# Patient Record
Sex: Male | Born: 1955
Health system: Southern US, Community
[De-identification: ages and names within clinical notes are randomized; demographics above are authoritative.]

## PROBLEM LIST (undated history)

## (undated) DIAGNOSIS — I7121 Aneurysm of the ascending aorta, without rupture: Secondary | ICD-10-CM

## (undated) DIAGNOSIS — C61 Malignant neoplasm of prostate: Secondary | ICD-10-CM

## (undated) DIAGNOSIS — R739 Hyperglycemia, unspecified: Secondary | ICD-10-CM

## (undated) DIAGNOSIS — T7840XA Allergy, unspecified, initial encounter: Secondary | ICD-10-CM

## (undated) DIAGNOSIS — R200 Anesthesia of skin: Secondary | ICD-10-CM

## (undated) DIAGNOSIS — M4802 Spinal stenosis, cervical region: Secondary | ICD-10-CM

## (undated) DIAGNOSIS — M5090 Cervical disc disorder, unspecified, unspecified cervical region: Secondary | ICD-10-CM

## (undated) DIAGNOSIS — G992 Myelopathy in diseases classified elsewhere: Secondary | ICD-10-CM

## (undated) DIAGNOSIS — L570 Actinic keratosis: Secondary | ICD-10-CM

## (undated) DIAGNOSIS — R972 Elevated prostate specific antigen [PSA]: Secondary | ICD-10-CM

## (undated) DIAGNOSIS — G4730 Sleep apnea, unspecified: Secondary | ICD-10-CM

## (undated) DIAGNOSIS — R0789 Other chest pain: Secondary | ICD-10-CM

## (undated) DIAGNOSIS — G4733 Obstructive sleep apnea (adult) (pediatric): Secondary | ICD-10-CM

## (undated) DIAGNOSIS — M1990 Unspecified osteoarthritis, unspecified site: Secondary | ICD-10-CM

## (undated) DIAGNOSIS — R7303 Prediabetes: Secondary | ICD-10-CM

## (undated) DIAGNOSIS — G709 Myoneural disorder, unspecified: Secondary | ICD-10-CM

## (undated) DIAGNOSIS — M519 Unspecified thoracic, thoracolumbar and lumbosacral intervertebral disc disorder: Secondary | ICD-10-CM

## (undated) DIAGNOSIS — I700 Atherosclerosis of aorta: Secondary | ICD-10-CM

## (undated) DIAGNOSIS — K219 Gastro-esophageal reflux disease without esophagitis: Secondary | ICD-10-CM

## (undated) DIAGNOSIS — I10 Essential (primary) hypertension: Secondary | ICD-10-CM

## (undated) DIAGNOSIS — M109 Gout, unspecified: Secondary | ICD-10-CM

## (undated) DIAGNOSIS — M461 Sacroiliitis, not elsewhere classified: Secondary | ICD-10-CM

## (undated) DIAGNOSIS — M1611 Unilateral primary osteoarthritis, right hip: Secondary | ICD-10-CM

## (undated) DIAGNOSIS — E119 Type 2 diabetes mellitus without complications: Secondary | ICD-10-CM

## (undated) DIAGNOSIS — I712 Thoracic aortic aneurysm, without rupture, unspecified: Secondary | ICD-10-CM

## (undated) DIAGNOSIS — E785 Hyperlipidemia, unspecified: Secondary | ICD-10-CM

## (undated) HISTORY — DX: Actinic keratosis: L57.0

## (undated) HISTORY — PX: INGUINAL HERNIA REPAIR: SHX194

## (undated) HISTORY — DX: Myoneural disorder, unspecified: G70.9

## (undated) HISTORY — PX: COLONOSCOPY: SHX174

## (undated) HISTORY — PX: APPENDECTOMY: SHX54

## (undated) HISTORY — PX: ROTATOR CUFF REPAIR: SHX139

## (undated) HISTORY — PX: TONSILLECTOMY: SUR1361

## (undated) HISTORY — DX: Allergy, unspecified, initial encounter: T78.40XA

## (undated) HISTORY — PX: HERNIA REPAIR: SHX51

## (undated) HISTORY — PX: CATARACT EXTRACTION: SUR2

---

## 2004-06-26 ENCOUNTER — Ambulatory Visit: Payer: Self-pay | Admitting: Orthopaedic Surgery

## 2004-12-19 ENCOUNTER — Ambulatory Visit: Payer: Self-pay | Admitting: Anesthesiology

## 2004-12-31 ENCOUNTER — Ambulatory Visit: Payer: Self-pay | Admitting: Anesthesiology

## 2005-01-07 ENCOUNTER — Ambulatory Visit: Payer: Self-pay | Admitting: Anesthesiology

## 2005-01-30 ENCOUNTER — Ambulatory Visit: Payer: Self-pay | Admitting: Anesthesiology

## 2005-08-19 ENCOUNTER — Emergency Department: Payer: Self-pay | Admitting: Emergency Medicine

## 2009-10-15 ENCOUNTER — Ambulatory Visit: Payer: Self-pay | Admitting: Unknown Physician Specialty

## 2012-10-28 ENCOUNTER — Ambulatory Visit: Payer: Self-pay | Admitting: Internal Medicine

## 2013-11-05 ENCOUNTER — Emergency Department: Payer: Self-pay | Admitting: Emergency Medicine

## 2013-11-05 LAB — URINALYSIS, COMPLETE
Bacteria: NONE SEEN
Bilirubin,UR: NEGATIVE
Blood: NEGATIVE
Glucose,UR: NEGATIVE mg/dL (ref 0–75)
Ketone: NEGATIVE
Leukocyte Esterase: NEGATIVE
NITRITE: NEGATIVE
PH: 7 (ref 4.5–8.0)
Protein: NEGATIVE
RBC,UR: 3 /HPF (ref 0–5)
SPECIFIC GRAVITY: 1.008 (ref 1.003–1.030)
Squamous Epithelial: NONE SEEN
WBC UR: NONE SEEN /HPF (ref 0–5)

## 2013-11-05 LAB — CBC
HCT: 47.3 % (ref 40.0–52.0)
HGB: 16.1 g/dL (ref 13.0–18.0)
MCH: 29.1 pg (ref 26.0–34.0)
MCHC: 34 g/dL (ref 32.0–36.0)
MCV: 86 fL (ref 80–100)
Platelet: 290 10*3/uL (ref 150–440)
RBC: 5.51 10*6/uL (ref 4.40–5.90)
RDW: 13.7 % (ref 11.5–14.5)
WBC: 10.6 10*3/uL (ref 3.8–10.6)

## 2013-11-05 LAB — COMPREHENSIVE METABOLIC PANEL
ALT: 40 U/L
Albumin: 3.7 g/dL (ref 3.4–5.0)
Alkaline Phosphatase: 62 U/L
Anion Gap: 8 (ref 7–16)
BUN: 11 mg/dL (ref 7–18)
Bilirubin,Total: 0.4 mg/dL (ref 0.2–1.0)
CALCIUM: 10.4 mg/dL — AB (ref 8.5–10.1)
Chloride: 106 mmol/L (ref 98–107)
Co2: 26 mmol/L (ref 21–32)
Creatinine: 1.13 mg/dL (ref 0.60–1.30)
Glucose: 150 mg/dL — ABNORMAL HIGH (ref 65–99)
OSMOLALITY: 282 (ref 275–301)
POTASSIUM: 3.8 mmol/L (ref 3.5–5.1)
SGOT(AST): 30 U/L (ref 15–37)
Sodium: 140 mmol/L (ref 136–145)
Total Protein: 7.9 g/dL (ref 6.4–8.2)

## 2013-11-05 LAB — TROPONIN I

## 2013-11-05 LAB — LIPASE, BLOOD: LIPASE: 228 U/L (ref 73–393)

## 2014-03-07 ENCOUNTER — Emergency Department: Payer: Self-pay | Admitting: Emergency Medicine

## 2015-05-11 ENCOUNTER — Encounter: Payer: Self-pay | Admitting: *Deleted

## 2015-05-14 ENCOUNTER — Ambulatory Visit
Admission: RE | Admit: 2015-05-14 | Discharge: 2015-05-14 | Disposition: A | Discharge status: Home / Self Care | Payer: BLUE CROSS/BLUE SHIELD | Source: Ambulatory Visit | Attending: Unknown Physician Specialty | Admitting: Unknown Physician Specialty

## 2015-05-14 ENCOUNTER — Ambulatory Visit: Payer: BLUE CROSS/BLUE SHIELD | Admitting: Anesthesiology

## 2015-05-14 ENCOUNTER — Encounter: Payer: Self-pay | Admitting: Anesthesiology

## 2015-05-14 ENCOUNTER — Encounter
Admission: RE | Disposition: A | Discharge status: Home / Self Care | Payer: Self-pay | Source: Ambulatory Visit | Attending: Unknown Physician Specialty

## 2015-05-14 DIAGNOSIS — Z86010 Personal history of colonic polyps: Secondary | ICD-10-CM | POA: Diagnosis not present

## 2015-05-14 DIAGNOSIS — R0789 Other chest pain: Secondary | ICD-10-CM | POA: Insufficient documentation

## 2015-05-14 DIAGNOSIS — R739 Hyperglycemia, unspecified: Secondary | ICD-10-CM | POA: Insufficient documentation

## 2015-05-14 DIAGNOSIS — I10 Essential (primary) hypertension: Secondary | ICD-10-CM | POA: Insufficient documentation

## 2015-05-14 DIAGNOSIS — M109 Gout, unspecified: Secondary | ICD-10-CM | POA: Diagnosis not present

## 2015-05-14 DIAGNOSIS — R200 Anesthesia of skin: Secondary | ICD-10-CM | POA: Insufficient documentation

## 2015-05-14 DIAGNOSIS — E785 Hyperlipidemia, unspecified: Secondary | ICD-10-CM | POA: Diagnosis not present

## 2015-05-14 DIAGNOSIS — K219 Gastro-esophageal reflux disease without esophagitis: Secondary | ICD-10-CM | POA: Diagnosis not present

## 2015-05-14 DIAGNOSIS — Z79899 Other long term (current) drug therapy: Secondary | ICD-10-CM | POA: Insufficient documentation

## 2015-05-14 DIAGNOSIS — K640 First degree hemorrhoids: Secondary | ICD-10-CM | POA: Diagnosis not present

## 2015-05-14 DIAGNOSIS — Z1211 Encounter for screening for malignant neoplasm of colon: Secondary | ICD-10-CM | POA: Insufficient documentation

## 2015-05-14 DIAGNOSIS — M1990 Unspecified osteoarthritis, unspecified site: Secondary | ICD-10-CM | POA: Diagnosis not present

## 2015-05-14 DIAGNOSIS — K5730 Diverticulosis of large intestine without perforation or abscess without bleeding: Secondary | ICD-10-CM | POA: Diagnosis not present

## 2015-05-14 DIAGNOSIS — G4730 Sleep apnea, unspecified: Secondary | ICD-10-CM | POA: Insufficient documentation

## 2015-05-14 HISTORY — DX: Allergy, unspecified, initial encounter: T78.40XA

## 2015-05-14 HISTORY — PX: COLONOSCOPY: SHX5424

## 2015-05-14 HISTORY — DX: Unspecified osteoarthritis, unspecified site: M19.90

## 2015-05-14 HISTORY — DX: Gout, unspecified: M10.9

## 2015-05-14 HISTORY — DX: Hyperlipidemia, unspecified: E78.5

## 2015-05-14 HISTORY — DX: Sleep apnea, unspecified: G47.30

## 2015-05-14 HISTORY — DX: Other chest pain: R07.89

## 2015-05-14 HISTORY — DX: Hyperglycemia, unspecified: R73.9

## 2015-05-14 HISTORY — DX: Anesthesia of skin: R20.0

## 2015-05-14 HISTORY — DX: Gastro-esophageal reflux disease without esophagitis: K21.9

## 2015-05-14 HISTORY — DX: Essential (primary) hypertension: I10

## 2015-05-14 SURGERY — COLONOSCOPY
Anesthesia: General

## 2015-05-14 MED ORDER — MIDAZOLAM HCL 5 MG/5ML IJ SOLN
INTRAMUSCULAR | Status: DC | PRN
  Administered 2015-05-14: 1 mg via INTRAVENOUS

## 2015-05-14 MED ORDER — PROPOFOL 10 MG/ML IV BOLUS
INTRAVENOUS | Status: DC | PRN
  Administered 2015-05-14: 50 mg via INTRAVENOUS

## 2015-05-14 MED ORDER — SODIUM CHLORIDE 0.9 % IV SOLN
INTRAVENOUS | Status: DC
  Administered 2015-05-14: 1000 mL via INTRAVENOUS

## 2015-05-14 MED ORDER — FENTANYL CITRATE (PF) 100 MCG/2ML IJ SOLN
INTRAMUSCULAR | Status: DC | PRN
  Administered 2015-05-14: 50 ug via INTRAVENOUS

## 2015-05-14 MED ORDER — SODIUM CHLORIDE 0.9 % IV SOLN: INTRAVENOUS | Status: DC

## 2015-05-14 MED ORDER — PROPOFOL 500 MG/50ML IV EMUL
INTRAVENOUS | Status: DC | PRN
  Administered 2015-05-14: 100 ug/kg/min via INTRAVENOUS

## 2015-05-14 MED ORDER — LIDOCAINE HCL (PF) 2 % IJ SOLN
INTRAMUSCULAR | Status: DC | PRN
  Administered 2015-05-14: 60 mg

## 2015-05-14 NOTE — Transfer of Care (Signed)
Immediate Anesthesia Transfer of Care Note  Patient: Rodney Clark  Procedure(s) Performed: Procedure(s): COLONOSCOPY (N/A)  Patient Location: PACU  Anesthesia Type:General  Level of Consciousness: sedated  Airway & Oxygen Therapy: Patient Spontanous Breathing and Patient connected to nasal cannula oxygen  Post-op Assessment: Report given to RN and Post -op Vital signs reviewed and stable  Post vital signs: Reviewed and stable  Last Vitals:  Filed Vitals:   05/14/15 1147  BP: 135/75  Pulse: 53  Temp: 37.3 C  Resp: 16    Complications: No apparent anesthesia complications

## 2015-05-14 NOTE — H&P (Signed)
   Primary Care Physician:  Gabriel Earing, MD Primary Gastroenterologist:  Dr. Vira Agar  Pre-Procedure History & Physical: HPI:  Rodney Clark is a 60 y.o. male is here for an colonoscopy.   Past Medical History  Diagnosis Date  . Allergic state   . Arthritis   . Atypical chest pain   . GERD (gastroesophageal reflux disease)   . Gout   . Hyperglycemia   . Hyperlipidemia   . Hypertension   . Sleep apnea   . Numbness of toes     Past Surgical History  Procedure Laterality Date  . Tonsillectomy    . Appendectomy    . Hernia repair    . Rotator cuff repair Right     Prior to Admission medications   Medication Sig Start Date End Date Taking? Authorizing Provider  azelastine (ASTELIN) 0.1 % nasal spray Place 1 spray into both nostrils 2 (two) times daily. Use in each nostril as directed   Yes Historical Provider, MD  baclofen (LIORESAL) 10 MG tablet Take 10 mg by mouth 3 (three) times daily.   Yes Historical Provider, MD  famotidine (PEPCID) 20 MG tablet Take 20 mg by mouth 2 (two) times daily.   Yes Historical Provider, MD  fenofibrate 160 MG tablet Take 160 mg by mouth daily.   Yes Historical Provider, MD  gabapentin (NEURONTIN) 600 MG tablet Take 600 mg by mouth 3 (three) times daily.   Yes Historical Provider, MD  hydrocortisone (ANUSOL-HC) 2.5 % rectal cream Place 1 application rectally 2 (two) times daily.   Yes Historical Provider, MD  losartan-hydrochlorothiazide (HYZAAR) 50-12.5 MG tablet Take 1 tablet by mouth daily.   Yes Historical Provider, MD    Allergies as of 03/22/2015  . (Not on File)    History reviewed. No pertinent family history.  Social History   Social History  . Marital Status: Married    Spouse Name: N/A  . Number of Children: N/A  . Years of Education: N/A   Occupational History  . Not on file.   Social History Main Topics  . Smoking status: Not on file  . Smokeless tobacco: Not on file  . Alcohol Use: Not on file  . Drug Use:  Not on file  . Sexual Activity: Not on file   Other Topics Concern  . Not on file   Social History Narrative    Review of Systems: See HPI, otherwise negative ROS  Physical Exam: BP 135/75 mmHg  Pulse 53  Temp(Src) 99.2 F (37.3 C) (Tympanic)  Resp 16  Ht 5\' 10"  (1.778 m)  Wt 109.317 kg (241 lb)  BMI 34.58 kg/m2  SpO2 99% General:   Alert,  pleasant and cooperative in NAD Head:  Normocephalic and atraumatic. Neck:  Supple; no masses or thyromegaly. Lungs:  Clear throughout to auscultation.    Heart:  Regular rate and rhythm. Abdomen:  Soft, nontender and nondistended. Normal bowel sounds, without guarding, and without rebound.   Neurologic:  Alert and  oriented x4;  grossly normal neurologically.  Impression/Plan: Rodney Clark is here for an colonoscopy to be performed for [facility] colon polyps  Risks, benefits, limitations, and alternatives regarding  colonoscopy have been reviewed with the patient.  Questions have been answered.  All parties agreeable.   Gaylyn Cheers, MD  05/14/2015, 1:19 PM

## 2015-05-14 NOTE — Anesthesia Preprocedure Evaluation (Addendum)
Anesthesia Evaluation  Patient identified by MRN, date of birth, ID band Patient awake    Reviewed: Allergy & Precautions, NPO status , Patient's Chart, lab work & pertinent test results  Airway Mallampati: II  TM Distance: >3 FB Neck ROM: Full    Dental  (+) Chipped   Pulmonary sleep apnea ,  Sinus allergies   Pulmonary exam normal breath sounds clear to auscultation       Cardiovascular hypertension, Pt. on medications Normal cardiovascular exam     Neuro/Psych Numbness of toes negative psych ROS   GI/Hepatic Neg liver ROS, GERD  Medicated and Controlled,  Endo/Other  negative endocrine ROS  Renal/GU negative Renal ROS     Musculoskeletal  (+) Arthritis , Osteoarthritis,    Abdominal Normal abdominal exam  (+)   Peds negative pediatric ROS (+)  Hematology negative hematology ROS (+)   Anesthesia Other Findings   Reproductive/Obstetrics negative OB ROS                            Anesthesia Physical Anesthesia Plan  ASA: II  Anesthesia Plan: General   Post-op Pain Management:    Induction: Intravenous  Airway Management Planned: Nasal Cannula  Additional Equipment:   Intra-op Plan:   Post-operative Plan:   Informed Consent: I have reviewed the patients History and Physical, chart, labs and discussed the procedure including the risks, benefits and alternatives for the proposed anesthesia with the patient or authorized representative who has indicated his/her understanding and acceptance.   Dental advisory given  Plan Discussed with: CRNA and Surgeon  Anesthesia Plan Comments:         Anesthesia Quick Evaluation

## 2015-05-14 NOTE — Op Note (Signed)
[facility] Gastroenterology Patient Name: Rodney Clark Procedure Date: 05/14/2015 1:23 PM MRN: [id number] Account #: [id number] Date of Birth: Mar 29, 1956 Admit Type: Outpatient Age: 60 Room: [facility] ENDO ROOM 1 Gender: Male Note Status: Finalized Procedure:         Colonoscopy Indications:       High risk colon cancer surveillance: Personal history of                     colonic polyps Providers:         Manya Silvas, MD Referring MD:      Ramonita Lab, MD (Referring MD) Medicines:         Propofol per Anesthesia Complications:     No immediate complications. Procedure:         Pre-Anesthesia Assessment:                    - After reviewing the risks and benefits, the patient was                     deemed in satisfactory condition to undergo the procedure.                    After obtaining informed consent, the colonoscope was                     passed under direct vision. Throughout the procedure, the                     patient's blood pressure, pulse, and oxygen saturations                     were monitored continuously. The Colonoscope was                     introduced through the anus and advanced to the the cecum,                     identified by appendiceal orifice and ileocecal valve. The                     colonoscopy was performed without difficulty. The patient                     tolerated the procedure well. The quality of the bowel                     preparation was good. Findings:      A few small-mouthed diverticula were found in the sigmoid colon.      Internal hemorrhoids were found during endoscopy. The hemorrhoids were       small and Grade I (internal hemorrhoids that do not prolapse).      The exam was otherwise without abnormality. Impression:        - Diverticulosis in the sigmoid colon.                    - Internal hemorrhoids.                    - The examination was otherwise normal.                    - No specimens  collected. Recommendation:    - Repeat colonoscopy in 5 years for surveillance. Manya Silvas, MD  05/14/2015 1:47:54 PM This report has been signed electronically. Number of Addenda: 0 Note Initiated On: 05/14/2015 1:23 PM Scope Withdrawal Time: 0 hours 11 minutes 18 seconds  Total Procedure Duration: 0 hours 17 minutes 6 seconds       [facility]

## 2015-05-16 ENCOUNTER — Encounter: Payer: Self-pay | Admitting: Unknown Physician Specialty

## 2015-05-16 NOTE — Anesthesia Postprocedure Evaluation (Signed)
Anesthesia Post Note  Patient: Rodney Clark  Procedure(s) Performed: Procedure(s) (LRB): COLONOSCOPY (N/A)  Patient location during evaluation: Endoscopy Anesthesia Type: General Level of consciousness: awake and alert and oriented Pain management: pain level controlled Vital Signs Assessment: post-procedure vital signs reviewed and stable Respiratory status: spontaneous breathing Cardiovascular status: blood pressure returned to baseline Anesthetic complications: no    Last Vitals:  Filed Vitals:   05/14/15 1410 05/14/15 1420  BP: 104/76 113/75  Pulse: 61 64  Temp:    Resp: 18 16    Last Pain: There were no vitals filed for this visit.               Blakelynn Scheeler

## 2015-08-28 DIAGNOSIS — I10 Essential (primary) hypertension: Secondary | ICD-10-CM | POA: Diagnosis present

## 2017-05-05 DIAGNOSIS — M5416 Radiculopathy, lumbar region: Secondary | ICD-10-CM | POA: Diagnosis not present

## 2017-05-05 DIAGNOSIS — M5126 Other intervertebral disc displacement, lumbar region: Secondary | ICD-10-CM | POA: Diagnosis not present

## 2017-05-05 DIAGNOSIS — Z6837 Body mass index (BMI) 37.0-37.9, adult: Secondary | ICD-10-CM | POA: Diagnosis not present

## 2017-05-08 DIAGNOSIS — Z23 Encounter for immunization: Secondary | ICD-10-CM | POA: Diagnosis not present

## 2017-05-19 DIAGNOSIS — M5126 Other intervertebral disc displacement, lumbar region: Secondary | ICD-10-CM | POA: Diagnosis not present

## 2017-06-23 DIAGNOSIS — M48062 Spinal stenosis, lumbar region with neurogenic claudication: Secondary | ICD-10-CM | POA: Diagnosis not present

## 2017-06-23 DIAGNOSIS — M5416 Radiculopathy, lumbar region: Secondary | ICD-10-CM | POA: Diagnosis not present

## 2017-06-23 DIAGNOSIS — M5126 Other intervertebral disc displacement, lumbar region: Secondary | ICD-10-CM | POA: Diagnosis not present

## 2017-06-30 DIAGNOSIS — Z6836 Body mass index (BMI) 36.0-36.9, adult: Secondary | ICD-10-CM | POA: Diagnosis not present

## 2017-06-30 DIAGNOSIS — M5126 Other intervertebral disc displacement, lumbar region: Secondary | ICD-10-CM | POA: Diagnosis not present

## 2017-06-30 DIAGNOSIS — M48062 Spinal stenosis, lumbar region with neurogenic claudication: Secondary | ICD-10-CM | POA: Diagnosis not present

## 2017-07-08 DIAGNOSIS — M48062 Spinal stenosis, lumbar region with neurogenic claudication: Secondary | ICD-10-CM | POA: Diagnosis not present

## 2017-07-08 DIAGNOSIS — M5126 Other intervertebral disc displacement, lumbar region: Secondary | ICD-10-CM | POA: Diagnosis not present

## 2017-07-08 DIAGNOSIS — M5416 Radiculopathy, lumbar region: Secondary | ICD-10-CM | POA: Diagnosis not present

## 2017-07-28 DIAGNOSIS — D2261 Melanocytic nevi of right upper limb, including shoulder: Secondary | ICD-10-CM | POA: Diagnosis not present

## 2017-07-28 DIAGNOSIS — D225 Melanocytic nevi of trunk: Secondary | ICD-10-CM | POA: Diagnosis not present

## 2017-07-28 DIAGNOSIS — Z85828 Personal history of other malignant neoplasm of skin: Secondary | ICD-10-CM | POA: Diagnosis not present

## 2017-07-28 DIAGNOSIS — L570 Actinic keratosis: Secondary | ICD-10-CM | POA: Diagnosis not present

## 2017-07-28 DIAGNOSIS — L820 Inflamed seborrheic keratosis: Secondary | ICD-10-CM | POA: Diagnosis not present

## 2017-07-28 DIAGNOSIS — X32XXXA Exposure to sunlight, initial encounter: Secondary | ICD-10-CM | POA: Diagnosis not present

## 2017-07-28 DIAGNOSIS — L538 Other specified erythematous conditions: Secondary | ICD-10-CM | POA: Diagnosis not present

## 2017-08-17 DIAGNOSIS — M5126 Other intervertebral disc displacement, lumbar region: Secondary | ICD-10-CM | POA: Diagnosis not present

## 2017-08-17 DIAGNOSIS — M48062 Spinal stenosis, lumbar region with neurogenic claudication: Secondary | ICD-10-CM | POA: Diagnosis not present

## 2017-08-17 DIAGNOSIS — Z6836 Body mass index (BMI) 36.0-36.9, adult: Secondary | ICD-10-CM | POA: Diagnosis not present

## 2017-08-20 ENCOUNTER — Other Ambulatory Visit: Payer: Self-pay | Admitting: Physical Medicine and Rehabilitation

## 2017-08-20 DIAGNOSIS — M48062 Spinal stenosis, lumbar region with neurogenic claudication: Secondary | ICD-10-CM

## 2017-08-31 ENCOUNTER — Other Ambulatory Visit: Payer: Self-pay | Admitting: Physical Medicine and Rehabilitation

## 2017-08-31 ENCOUNTER — Ambulatory Visit
Admission: RE | Admit: 2017-08-31 | Discharge: 2017-08-31 | Disposition: A | Discharge status: Home / Self Care | Payer: 59 | Source: Ambulatory Visit | Attending: Physical Medicine and Rehabilitation | Admitting: Physical Medicine and Rehabilitation

## 2017-08-31 ENCOUNTER — Ambulatory Visit
Admission: RE | Admit: 2017-08-31 | Discharge: 2017-08-31 | Disposition: A | Discharge status: Home / Self Care | Payer: Self-pay | Source: Ambulatory Visit | Attending: Physical Medicine and Rehabilitation | Admitting: Physical Medicine and Rehabilitation

## 2017-08-31 DIAGNOSIS — R52 Pain, unspecified: Secondary | ICD-10-CM

## 2017-08-31 DIAGNOSIS — M48062 Spinal stenosis, lumbar region with neurogenic claudication: Secondary | ICD-10-CM

## 2017-08-31 DIAGNOSIS — M48061 Spinal stenosis, lumbar region without neurogenic claudication: Secondary | ICD-10-CM | POA: Diagnosis not present

## 2017-08-31 MED ORDER — IOPAMIDOL (ISOVUE-M 200) INJECTION 41%
15.0000 mL | Freq: Once | INTRAMUSCULAR | Status: AC
  Administered 2017-08-31: 15 mL via INTRATHECAL

## 2017-08-31 MED ORDER — ONDANSETRON HCL 4 MG/2ML IJ SOLN
4.0000 mg | Freq: Once | INTRAMUSCULAR | Status: AC
Start: 2017-08-31 — End: 2017-08-31
  Administered 2017-08-31: 4 mg via INTRAMUSCULAR

## 2017-08-31 MED ORDER — MEPERIDINE HCL 100 MG/ML IJ SOLN
75.0000 mg | Freq: Once | INTRAMUSCULAR | Status: AC
  Administered 2017-08-31: 75 mg via INTRAMUSCULAR

## 2017-08-31 NOTE — Discharge Instructions (Signed)

## 2017-08-31 NOTE — Progress Notes (Signed)
Patient states he took two Valium 5mg  tablets "about an hour ago" that Dr. Brien Few prescribed him.

## 2017-09-21 DIAGNOSIS — M5126 Other intervertebral disc displacement, lumbar region: Secondary | ICD-10-CM | POA: Diagnosis not present

## 2017-09-21 DIAGNOSIS — M5416 Radiculopathy, lumbar region: Secondary | ICD-10-CM | POA: Diagnosis not present

## 2017-09-21 DIAGNOSIS — M48062 Spinal stenosis, lumbar region with neurogenic claudication: Secondary | ICD-10-CM | POA: Diagnosis not present

## 2017-09-23 DIAGNOSIS — M5416 Radiculopathy, lumbar region: Secondary | ICD-10-CM | POA: Diagnosis not present

## 2017-09-23 DIAGNOSIS — M5126 Other intervertebral disc displacement, lumbar region: Secondary | ICD-10-CM | POA: Diagnosis not present

## 2017-09-23 DIAGNOSIS — M48062 Spinal stenosis, lumbar region with neurogenic claudication: Secondary | ICD-10-CM | POA: Diagnosis not present

## 2017-09-30 DIAGNOSIS — G4733 Obstructive sleep apnea (adult) (pediatric): Secondary | ICD-10-CM | POA: Diagnosis not present

## 2017-09-30 DIAGNOSIS — E7849 Other hyperlipidemia: Secondary | ICD-10-CM | POA: Diagnosis not present

## 2017-09-30 DIAGNOSIS — M1990 Unspecified osteoarthritis, unspecified site: Secondary | ICD-10-CM | POA: Diagnosis not present

## 2017-09-30 DIAGNOSIS — R739 Hyperglycemia, unspecified: Secondary | ICD-10-CM | POA: Diagnosis not present

## 2017-09-30 DIAGNOSIS — I10 Essential (primary) hypertension: Secondary | ICD-10-CM | POA: Diagnosis not present

## 2017-09-30 DIAGNOSIS — K219 Gastro-esophageal reflux disease without esophagitis: Secondary | ICD-10-CM | POA: Diagnosis not present

## 2017-10-08 DIAGNOSIS — M4317 Spondylolisthesis, lumbosacral region: Secondary | ICD-10-CM | POA: Diagnosis not present

## 2017-10-08 DIAGNOSIS — Z6837 Body mass index (BMI) 37.0-37.9, adult: Secondary | ICD-10-CM | POA: Diagnosis not present

## 2017-10-20 DIAGNOSIS — M545 Low back pain: Secondary | ICD-10-CM | POA: Diagnosis not present

## 2017-10-28 DIAGNOSIS — M545 Low back pain: Secondary | ICD-10-CM | POA: Diagnosis not present

## 2017-11-05 ENCOUNTER — Other Ambulatory Visit: Payer: Self-pay | Admitting: Neurosurgery

## 2017-11-05 DIAGNOSIS — Z6836 Body mass index (BMI) 36.0-36.9, adult: Secondary | ICD-10-CM | POA: Diagnosis not present

## 2017-11-05 DIAGNOSIS — M4317 Spondylolisthesis, lumbosacral region: Secondary | ICD-10-CM | POA: Diagnosis not present

## 2017-11-05 DIAGNOSIS — M7542 Impingement syndrome of left shoulder: Secondary | ICD-10-CM | POA: Diagnosis not present

## 2017-12-16 DIAGNOSIS — M25512 Pain in left shoulder: Secondary | ICD-10-CM | POA: Diagnosis not present

## 2017-12-19 DIAGNOSIS — Z23 Encounter for immunization: Secondary | ICD-10-CM | POA: Diagnosis not present

## 2017-12-22 NOTE — Pre-Procedure Instructions (Signed)
Rodney Clark  12/22/2017      [id number] [address], [place] [address] Phone: [phone] Fax: [phone]    Your procedure is scheduled on Oct. 8  Report to [facility] Admitting at 6:00 A.M.  Call this number if you have problems the morning of surgery:  [phone]   Remember:  Do not eat or drink after midnight.      Take these medicines the morning of surgery with A SIP OF WATER :              Tylenol if needed             Famotidine (pepcid)             fexofenadine (allegra)             Gabapentin (neurontin)             Tizanidine (zanaflex)             Medrol (methylprednsolone)                  7 days prior to surgery STOP taking any Aspirin(unless otherwise instructed by your surgeon), Aleve, Naproxen, Ibuprofen, Motrin, Advil, Goody's, BC's, all herbal medications, fish oil, and all vitamins                  Do not wear jewelry.  Do not wear lotions, powders, or perfumes, or deodorant.  Do not shave 48 hours prior to surgery.  Men may shave face and neck.  Do not bring valuables to the hospital.  [facility] is not responsible for any belongings or valuables.  Contacts, dentures or bridgework may not be worn into surgery.  Leave your suitcase in the car.  After surgery it may be brought to your room.  For patients admitted to the hospital, discharge time will be determined by your treatment team.  Patients discharged the day of surgery will not be allowed to drive home.    Special instructions:  [place]- Preparing For Surgery  Before surgery, you can play an important role. Because skin is not sterile, your skin needs to be as free of germs as possible. You can reduce the number of germs on your skin by washing with CHG (chlorahexidine gluconate) Soap before surgery.  CHG is an antiseptic cleaner which kills germs and bonds with the skin to continue killing germs even after  washing.    Oral Hygiene is also important to reduce your risk of infection.  Remember - BRUSH YOUR TEETH THE MORNING OF SURGERY WITH YOUR REGULAR TOOTHPASTE  Please do not use if you have an allergy to CHG or antibacterial soaps. If your skin becomes reddened/irritated stop using the CHG.  Do not shave (including legs and underarms) for at least 48 hours prior to first CHG shower. It is OK to shave your face.  Please follow these instructions carefully.   1. Shower the NIGHT BEFORE SURGERY and the MORNING OF SURGERY with CHG.   2. If you chose to wash your hair, wash your hair first as usual with your normal shampoo.  3. After you shampoo, rinse your hair and body thoroughly to remove the shampoo.  4. Use CHG as you would any other liquid soap. You can apply CHG directly to the skin and wash gently with a scrungie or a clean washcloth.   5. Apply the CHG Soap to  your body ONLY FROM THE NECK DOWN.  Do not use on open wounds or open sores. Avoid contact with your eyes, ears, mouth and genitals (private parts). Wash Face and genitals (private parts)  with your normal soap.  6. Wash thoroughly, paying special attention to the area where your surgery will be performed.  7. Thoroughly rinse your body with warm water from the neck down.  8. DO NOT shower/wash with your normal soap after using and rinsing off the CHG Soap.  9. Pat yourself dry with a CLEAN TOWEL.  10. Wear CLEAN PAJAMAS to bed the night before surgery, wear comfortable clothes the morning of surgery  11. Place CLEAN SHEETS on your bed the night of your first shower and DO NOT SLEEP WITH PETS.    Day of Surgery:  Do not apply any deodorants/lotions.  Please wear clean clothes to the hospital/surgery center.   Remember to brush your teeth WITH YOUR REGULAR TOOTHPASTE.    Please read over the following fact sheets that you were given. Coughing and Deep Breathing, MRSA Information and Surgical Site Infection  Prevention

## 2017-12-23 ENCOUNTER — Encounter (HOSPITAL_COMMUNITY)
Admission: RE | Admit: 2017-12-23 | Discharge: 2017-12-23 | Disposition: A | Discharge status: Home / Self Care | Payer: 59 | Source: Ambulatory Visit | Attending: Neurosurgery | Admitting: Neurosurgery

## 2017-12-23 ENCOUNTER — Other Ambulatory Visit: Payer: Self-pay

## 2017-12-23 ENCOUNTER — Encounter (HOSPITAL_COMMUNITY): Payer: Self-pay

## 2017-12-23 DIAGNOSIS — Z01818 Encounter for other preprocedural examination: Secondary | ICD-10-CM | POA: Insufficient documentation

## 2017-12-23 DIAGNOSIS — I447 Left bundle-branch block, unspecified: Secondary | ICD-10-CM | POA: Insufficient documentation

## 2017-12-23 DIAGNOSIS — R001 Bradycardia, unspecified: Secondary | ICD-10-CM | POA: Insufficient documentation

## 2017-12-23 DIAGNOSIS — I10 Essential (primary) hypertension: Secondary | ICD-10-CM | POA: Diagnosis not present

## 2017-12-23 DIAGNOSIS — M4317 Spondylolisthesis, lumbosacral region: Secondary | ICD-10-CM | POA: Diagnosis not present

## 2017-12-23 LAB — BASIC METABOLIC PANEL
Anion gap: 9 (ref 5–15)
BUN: 16 mg/dL (ref 8–23)
CALCIUM: 9.7 mg/dL (ref 8.9–10.3)
CO2: 26 mmol/L (ref 22–32)
CREATININE: 0.87 mg/dL (ref 0.61–1.24)
Chloride: 103 mmol/L (ref 98–111)
GFR calc Af Amer: >60 mL/min (ref >60)
Glucose, Bld: 103 mg/dL — ABNORMAL HIGH (ref 70–99)
Potassium: 3.3 mmol/L — ABNORMAL LOW (ref 3.5–5.1)
Sodium: 138 mmol/L (ref 135–145)

## 2017-12-23 LAB — TYPE AND SCREEN
ABO/RH(D): B POS
Antibody Screen: NEGATIVE

## 2017-12-23 LAB — CBC WITH DIFFERENTIAL/PLATELET
ABS IMMATURE GRANULOCYTES: 0.1 10*3/uL (ref 0.0–0.1)
Basophils Absolute: 0.1 10*3/uL (ref 0.0–0.1)
Basophils Relative: 1 %
EOS PCT: 3 %
Eosinophils Absolute: 0.3 10*3/uL (ref 0.0–0.7)
HEMATOCRIT: 46.1 % (ref 39.0–52.0)
HEMOGLOBIN: 15 g/dL (ref 13.0–17.0)
Immature Granulocytes: 1 %
LYMPHS ABS: 3.9 10*3/uL (ref 0.7–4.0)
LYMPHS PCT: 35 %
MCH: 29 pg (ref 26.0–34.0)
MCHC: 32.5 g/dL (ref 30.0–36.0)
MCV: 89 fL (ref 78.0–100.0)
MONO ABS: 1.5 10*3/uL — AB (ref 0.1–1.0)
MONOS PCT: 13 %
NEUTROS ABS: 5.2 10*3/uL (ref 1.7–7.7)
Neutrophils Relative %: 47 %
Platelets: 255 10*3/uL (ref 150–400)
RBC: 5.18 MIL/uL (ref 4.22–5.81)
RDW: 12.3 % (ref 11.5–15.5)
WBC: 11.1 10*3/uL — ABNORMAL HIGH (ref 4.0–10.5)

## 2017-12-23 LAB — SURGICAL PCR SCREEN
MRSA, PCR: NEGATIVE
STAPHYLOCOCCUS AUREUS: NEGATIVE

## 2017-12-23 LAB — ABO/RH: ABO/RH(D): B POS

## 2017-12-23 NOTE — Pre-Procedure Instructions (Signed)
AVANT PRINTY  12/23/2017      [id number] [address], [place] [address] Phone: [phone] Fax: [phone]    Your procedure is scheduled on Oct. 8  Report to [facility] Admitting at 6:00 A.M.  Call this number if you have problems the morning of surgery:  [phone]   Remember:  Do not eat or drink after midnight.      Take these medicines the morning of surgery with A SIP OF WATER :              Tylenol if needed             Famotidine (pepcid)             fexofenadine (allegra)             Gabapentin (neurontin)             Tizanidine (zanaflex)             Medrol (methylprednsolone)                  7 days prior to surgery STOP taking any Aspirin(unless otherwise instructed by your surgeon), Aleve, Naproxen, Ibuprofen, Motrin, Advil, Goody's, BC's, all herbal medications, fish oil, and all vitamins                  Do not wear jewelry.  Do not wear lotions, powders, or perfumes, or deodorant.  Do not shave 48 hours prior to surgery.  Men may shave face and neck.  Do not bring valuables to the hospital.  [facility] is not responsible for any belongings or valuables.  Contacts, dentures or bridgework may not be worn into surgery.  Leave your suitcase in the car.  After surgery it may be brought to your room.  For patients admitted to the hospital, discharge time will be determined by your treatment team.  Patients discharged the day of surgery will not be allowed to drive home.    Special instructions:  [place]- Preparing For Surgery  Before surgery, you can play an important role. Because skin is not sterile, your skin needs to be as free of germs as possible. You can reduce the number of germs on your skin by washing with CHG (chlorahexidine gluconate) Soap before surgery.  CHG is an antiseptic cleaner which kills germs and bonds with the skin to continue killing germs even after  washing.    Oral Hygiene is also important to reduce your risk of infection.  Remember - BRUSH YOUR TEETH THE MORNING OF SURGERY WITH YOUR REGULAR TOOTHPASTE  Please do not use if you have an allergy to CHG or antibacterial soaps. If your skin becomes reddened/irritated stop using the CHG.  Do not shave (including legs and underarms) for at least 48 hours prior to first CHG shower. It is OK to shave your face.  Please follow these instructions carefully.   1. Shower the NIGHT BEFORE SURGERY and the MORNING OF SURGERY with CHG.   2. If you chose to wash your hair, wash your hair first as usual with your normal shampoo.  3. After you shampoo, rinse your hair and body thoroughly to remove the shampoo.  4. Use CHG as you would any other liquid soap. You can apply CHG directly to the skin and wash gently with a scrungie or a clean washcloth.   5. Apply the CHG Soap to  your body ONLY FROM THE NECK DOWN.  Do not use on open wounds or open sores. Avoid contact with your eyes, ears, mouth and genitals (private parts). Wash Face and genitals (private parts)  with your normal soap.  6. Wash thoroughly, paying special attention to the area where your surgery will be performed.  7. Thoroughly rinse your body with warm water from the neck down.  8. DO NOT shower/wash with your normal soap after using and rinsing off the CHG Soap.  9. Pat yourself dry with a CLEAN TOWEL.  10. Wear CLEAN PAJAMAS to bed the night before surgery, wear comfortable clothes the morning of surgery  11. Place CLEAN SHEETS on your bed the night of your first shower and DO NOT SLEEP WITH PETS.    Day of Surgery:  Do not apply any deodorants/lotions.  Please wear clean clothes to the hospital/surgery center.   Remember to brush your teeth WITH YOUR REGULAR TOOTHPASTE.    Please read over the following fact sheets that you were given. Coughing and Deep Breathing, MRSA Information and Surgical Site Infection  Prevention

## 2017-12-23 NOTE — Progress Notes (Signed)
PCP: Ramonita Lab [facility]) Cardiologist: denies DM: denies Echo: yes - pt does not recall date Stress Test: >5 years Cath: denies  Pt on Prednisone dose pack for shoulder pain and inflammation but will be finished by Friday, September 27th.  Pt has currently stopped taking Meloxicam.  Denies SOB, cough, fever, chest pain.

## 2017-12-24 NOTE — Progress Notes (Signed)
Anesthesia Chart Review:  Case:  [id number] Date/Time:  01/05/18 0915   Procedure:  PLIF - L5-S1 (N/A Back)   Anesthesia type:  General   Pre-op diagnosis:  Spondylolisthesis   Location:  [facility] OR ROOM 84 / [place] OR   Surgeon:  Earnie Larsson, MD      DISCUSSION: 62 yo male never smoker. Pertinent hx includes Gout, OSA not on CPAP, GERD, HTN, Atypical chest pain.  EKG done at PAT shows incomplete LBBB. Tracing from 2015 also shows LBBB, no significant change.  Anticipate he can proceed as planned barring acute status change.  VS: BP 125/68   Pulse 63   Temp 36.6 C   Resp 20   Ht 5\' 10"  (1.778 m)   SpO2 95%   BMI 34.58 kg/m   PROVIDERS: Adin Hector, MD is PCP last seen 09/30/2017  LABS: Labs reviewed: Acceptable for surgery. (all labs ordered are listed, but only abnormal results are displayed)  Labs Reviewed  CBC WITH DIFFERENTIAL/PLATELET - Abnormal; Notable for the following components:      Result Value   WBC 11.1 (*)    Monocytes Absolute 1.5 (*)    All other components within normal limits  BASIC METABOLIC PANEL - Abnormal; Notable for the following components:   Potassium 3.3 (*)    Glucose, Bld 103 (*)    All other components within normal limits  SURGICAL PCR SCREEN  TYPE AND SCREEN  ABO/RH     IMAGES: N/A   EKG: 12/23/17: Sinus bradycardia 58bpm. Incomplete left bundle branch block  CV: N/A  Past Medical History:  Diagnosis Date  . Allergic state   . Arthritis   . Atypical chest pain   . GERD (gastroesophageal reflux disease)   . Gout   . Hyperglycemia   . Hyperlipidemia   . Hypertension   . Numbness of toes   . Sleep apnea     Past Surgical History:  Procedure Laterality Date  . APPENDECTOMY    . COLONOSCOPY N/A 05/14/2015   Procedure: COLONOSCOPY;  Surgeon: Manya Silvas, MD;  Location: [facility] ENDOSCOPY;  Service: Endoscopy;  Laterality: N/A;  . HERNIA REPAIR    . ROTATOR CUFF REPAIR Right   . TONSILLECTOMY      MEDICATIONS: .  acetaminophen (TYLENOL) 500 MG tablet  . famotidine (PEPCID) 20 MG tablet  . fenofibrate 160 MG tablet  . fexofenadine (ALLEGRA) 180 MG tablet  . gabapentin (NEURONTIN) 300 MG capsule  . gabapentin (NEURONTIN) 600 MG tablet  . losartan-hydrochlorothiazide (HYZAAR) 50-12.5 MG tablet  . meloxicam (MOBIC) 15 MG tablet  . methylPREDNISolone (MEDROL) 4 MG tablet  . tiZANidine (ZANAFLEX) 4 MG tablet   No current facility-administered medications for this encounter.     Wynonia Musty [facility] Short Stay Center/Anesthesiology Phone [phone] 12/24/2017 2:16 PM

## 2017-12-24 NOTE — Progress Notes (Signed)
Anesthesia follow-up: See note by Karoline Caldwell, PA-C. Additional records noted:   Patient with incomplete LBBB (documented as left BBB on 11/07/13 EKG, although QRS is 104 ms). His current EKG appears stable dating back to at least 06/11/01 [facility]) and has had cardiac testing since this finding noted on his EKG.  Nuclear stress test in 10/28/12.  - Summary   1. No significant wall motion abnormality noted.   2. Pharmacological myocardial perfusion study with no significant  ischemia.   3. The estimated ejection fraction is 63%.   4. The left ventricular global function was normal.   5. There are no EKG changes concerning for ischemia.   6. There is no artifact noted on this study.   Echo 10/28/12: - Summary:   1. Left ventricular ejection fraction, by visual estimation, is 70 to  75%.   2. Normal global left ventricular systolic function.   3. Mild mitral valve regurgitation.   4. Mild aortic valve sclerosis without stenosis.   5. Moderately increased left ventricular posterior wall thickness.   6. Mild tricuspid regurgitation.   George Hugh [facility] Short Stay Center/Anesthesiology Phone [phone] 12/24/2017 5:44 PM

## 2018-01-05 ENCOUNTER — Inpatient Hospital Stay (HOSPITAL_COMMUNITY): Payer: 59

## 2018-01-05 ENCOUNTER — Inpatient Hospital Stay (HOSPITAL_COMMUNITY)
Admission: RE | Admit: 2018-01-05 | Discharge: 2018-01-06 | DRG: 455 | Disposition: A | Discharge status: Home / Self Care | Payer: 59 | Source: Ambulatory Visit | Attending: Neurosurgery | Admitting: Neurosurgery

## 2018-01-05 ENCOUNTER — Inpatient Hospital Stay (HOSPITAL_COMMUNITY)
Admission: RE | Disposition: A | Discharge status: Home / Self Care | Payer: Self-pay | Source: Ambulatory Visit | Attending: Neurosurgery

## 2018-01-05 ENCOUNTER — Inpatient Hospital Stay (HOSPITAL_COMMUNITY): Payer: 59 | Admitting: Physician Assistant

## 2018-01-05 ENCOUNTER — Inpatient Hospital Stay (HOSPITAL_COMMUNITY): Payer: 59 | Admitting: Anesthesiology

## 2018-01-05 ENCOUNTER — Encounter (HOSPITAL_COMMUNITY): Payer: Self-pay | Admitting: *Deleted

## 2018-01-05 DIAGNOSIS — I10 Essential (primary) hypertension: Secondary | ICD-10-CM | POA: Diagnosis not present

## 2018-01-05 DIAGNOSIS — M4317 Spondylolisthesis, lumbosacral region: Secondary | ICD-10-CM | POA: Diagnosis not present

## 2018-01-05 DIAGNOSIS — Z419 Encounter for procedure for purposes other than remedying health state, unspecified: Secondary | ICD-10-CM

## 2018-01-05 DIAGNOSIS — K219 Gastro-esophageal reflux disease without esophagitis: Secondary | ICD-10-CM | POA: Diagnosis not present

## 2018-01-05 DIAGNOSIS — Z791 Long term (current) use of non-steroidal anti-inflammatories (NSAID): Secondary | ICD-10-CM | POA: Diagnosis not present

## 2018-01-05 DIAGNOSIS — E785 Hyperlipidemia, unspecified: Secondary | ICD-10-CM | POA: Diagnosis present

## 2018-01-05 DIAGNOSIS — Z79899 Other long term (current) drug therapy: Secondary | ICD-10-CM | POA: Diagnosis not present

## 2018-01-05 DIAGNOSIS — Z981 Arthrodesis status: Secondary | ICD-10-CM | POA: Diagnosis not present

## 2018-01-05 DIAGNOSIS — M5417 Radiculopathy, lumbosacral region: Secondary | ICD-10-CM | POA: Diagnosis not present

## 2018-01-05 DIAGNOSIS — G4730 Sleep apnea, unspecified: Secondary | ICD-10-CM | POA: Diagnosis present

## 2018-01-05 DIAGNOSIS — Z888 Allergy status to other drugs, medicaments and biological substances status: Secondary | ICD-10-CM

## 2018-01-05 HISTORY — PX: BACK SURGERY: SHX140

## 2018-01-05 HISTORY — DX: Spondylolisthesis, lumbosacral region: M43.17

## 2018-01-05 HISTORY — PX: POSTERIOR LAMINECTOMY / DECOMPRESSION LUMBAR SPINE: SUR740

## 2018-01-05 SURGERY — POSTERIOR LUMBAR FUSION 1 LEVEL
Anesthesia: General | Site: Back

## 2018-01-05 MED ORDER — ONDANSETRON HCL 4 MG/2ML IJ SOLN: 4.0000 mg | Freq: Once | INTRAMUSCULAR | Status: DC | PRN

## 2018-01-05 MED ORDER — LORATADINE 10 MG PO TABS
10.0000 mg | ORAL_TABLET | Freq: Every day | ORAL | Status: DC
  Administered 2018-01-05 – 2018-01-06 (×2): 10 mg via ORAL
  Filled 2018-01-05 (×2): qty 1

## 2018-01-05 MED ORDER — OXYCODONE HCL 5 MG/5ML PO SOLN: 5.0000 mg | Freq: Once | ORAL | Status: DC | PRN

## 2018-01-05 MED ORDER — TAMSULOSIN HCL 0.4 MG PO CAPS: 0.8000 mg | ORAL_CAPSULE | Freq: Once | ORAL | Status: DC

## 2018-01-05 MED ORDER — SODIUM CHLORIDE 0.9% FLUSH: 3.0000 mL | INTRAVENOUS | Status: DC | PRN

## 2018-01-05 MED ORDER — GABAPENTIN 300 MG PO CAPS: 300.0000 mg | ORAL_CAPSULE | Freq: Three times a day (TID) | ORAL | Status: DC

## 2018-01-05 MED ORDER — HYDROCODONE-ACETAMINOPHEN 10-325 MG PO TABS
1.0000 | ORAL_TABLET | ORAL | Status: DC | PRN
  Administered 2018-01-05 (×2): 1 via ORAL
  Filled 2018-01-05 (×2): qty 1

## 2018-01-05 MED ORDER — 0.9 % SODIUM CHLORIDE (POUR BTL) OPTIME
TOPICAL | Status: DC | PRN
  Administered 2018-01-05: 1000 mL

## 2018-01-05 MED ORDER — DIAZEPAM 5 MG PO TABS
ORAL_TABLET | ORAL | Status: AC
  Filled 2018-01-05: qty 1

## 2018-01-05 MED ORDER — CEFAZOLIN SODIUM-DEXTROSE 2-4 GM/100ML-% IV SOLN
2.0000 g | INTRAVENOUS | Status: DC
  Filled 2018-01-05: qty 100

## 2018-01-05 MED ORDER — HYDROCHLOROTHIAZIDE 12.5 MG PO CAPS
12.5000 mg | ORAL_CAPSULE | Freq: Every day | ORAL | Status: DC
  Administered 2018-01-05 – 2018-01-06 (×2): 12.5 mg via ORAL
  Filled 2018-01-05 (×2): qty 1

## 2018-01-05 MED ORDER — SUCCINYLCHOLINE CHLORIDE 200 MG/10ML IV SOSY
PREFILLED_SYRINGE | INTRAVENOUS | Status: AC
  Filled 2018-01-05: qty 10

## 2018-01-05 MED ORDER — VANCOMYCIN HCL 1 G IV SOLR
INTRAVENOUS | Status: DC | PRN
  Administered 2018-01-05: 1000 mg

## 2018-01-05 MED ORDER — ACETAMINOPHEN 160 MG/5ML PO SOLN: 325.0000 mg | ORAL | Status: DC | PRN

## 2018-01-05 MED ORDER — THROMBIN 20000 UNITS EX SOLR
CUTANEOUS | Status: DC | PRN
  Administered 2018-01-05: 09:00:00 via TOPICAL

## 2018-01-05 MED ORDER — HYDROCODONE-ACETAMINOPHEN 10-325 MG PO TABS
ORAL_TABLET | ORAL | Status: AC
  Filled 2018-01-05: qty 1

## 2018-01-05 MED ORDER — FENTANYL CITRATE (PF) 100 MCG/2ML IJ SOLN
INTRAMUSCULAR | Status: AC
  Filled 2018-01-05: qty 2

## 2018-01-05 MED ORDER — ONDANSETRON HCL 4 MG/2ML IJ SOLN
INTRAMUSCULAR | Status: DC | PRN
  Administered 2018-01-05: 4 mg via INTRAVENOUS

## 2018-01-05 MED ORDER — ACETAMINOPHEN 650 MG RE SUPP: 650.0000 mg | RECTAL | Status: DC | PRN

## 2018-01-05 MED ORDER — ROCURONIUM BROMIDE 10 MG/ML (PF) SYRINGE
PREFILLED_SYRINGE | INTRAVENOUS | Status: DC | PRN
  Administered 2018-01-05: 10 mg via INTRAVENOUS
  Administered 2018-01-05: 60 mg via INTRAVENOUS
  Administered 2018-01-05: 10 mg via INTRAVENOUS

## 2018-01-05 MED ORDER — BUPIVACAINE HCL (PF) 0.25 % IJ SOLN
INTRAMUSCULAR | Status: DC | PRN
  Administered 2018-01-05: 30 mL

## 2018-01-05 MED ORDER — FENTANYL CITRATE (PF) 100 MCG/2ML IJ SOLN
25.0000 ug | INTRAMUSCULAR | Status: DC | PRN
  Administered 2018-01-05 (×2): 50 ug via INTRAVENOUS

## 2018-01-05 MED ORDER — THROMBIN 20000 UNITS EX KIT
PACK | CUTANEOUS | Status: AC
  Filled 2018-01-05: qty 1

## 2018-01-05 MED ORDER — MENTHOL 3 MG MT LOZG: 1.0000 | LOZENGE | OROMUCOSAL | Status: DC | PRN

## 2018-01-05 MED ORDER — LACTATED RINGERS IV SOLN
INTRAVENOUS | Status: DC | PRN
  Administered 2018-01-05 (×3): via INTRAVENOUS

## 2018-01-05 MED ORDER — ONDANSETRON HCL 4 MG/2ML IJ SOLN: 4.0000 mg | Freq: Four times a day (QID) | INTRAMUSCULAR | Status: DC | PRN

## 2018-01-05 MED ORDER — MELOXICAM 7.5 MG PO TABS
15.0000 mg | ORAL_TABLET | Freq: Every day | ORAL | Status: DC
  Administered 2018-01-05 – 2018-01-06 (×2): 15 mg via ORAL
  Filled 2018-01-05 (×2): qty 2

## 2018-01-05 MED ORDER — PHENOL 1.4 % MT LIQD: 1.0000 | OROMUCOSAL | Status: DC | PRN

## 2018-01-05 MED ORDER — CEFAZOLIN SODIUM-DEXTROSE 1-4 GM/50ML-% IV SOLN
1.0000 g | Freq: Three times a day (TID) | INTRAVENOUS | Status: AC
  Administered 2018-01-05 (×2): 1 g via INTRAVENOUS
  Filled 2018-01-05 (×2): qty 50

## 2018-01-05 MED ORDER — DEXTROSE 5 % IV SOLN
3.0000 g | INTRAVENOUS | Status: AC
  Administered 2018-01-05: 3 g via INTRAVENOUS
  Filled 2018-01-05: qty 3

## 2018-01-05 MED ORDER — SODIUM CHLORIDE 0.9 % IV SOLN: 250.0000 mL | INTRAVENOUS | Status: DC

## 2018-01-05 MED ORDER — GABAPENTIN 600 MG PO TABS
600.0000 mg | ORAL_TABLET | Freq: Three times a day (TID) | ORAL | Status: DC
  Administered 2018-01-05 – 2018-01-06 (×3): 600 mg via ORAL
  Filled 2018-01-05 (×3): qty 1

## 2018-01-05 MED ORDER — DEXAMETHASONE SODIUM PHOSPHATE 10 MG/ML IJ SOLN
10.0000 mg | INTRAMUSCULAR | Status: AC
  Administered 2018-01-05: 10 mg via INTRAVENOUS
  Filled 2018-01-05: qty 1

## 2018-01-05 MED ORDER — CHLORHEXIDINE GLUCONATE CLOTH 2 % EX PADS: 6.0000 | MEDICATED_PAD | Freq: Once | CUTANEOUS | Status: DC

## 2018-01-05 MED ORDER — VANCOMYCIN HCL 1000 MG IV SOLR
INTRAVENOUS | Status: AC
  Filled 2018-01-05: qty 1000

## 2018-01-05 MED ORDER — OXYCODONE HCL 5 MG PO TABS
10.0000 mg | ORAL_TABLET | ORAL | Status: DC | PRN
  Administered 2018-01-05 – 2018-01-06 (×4): 10 mg via ORAL
  Filled 2018-01-05 (×4): qty 2

## 2018-01-05 MED ORDER — SODIUM CHLORIDE 0.9 % IV SOLN
INTRAVENOUS | Status: DC | PRN
  Administered 2018-01-05: 09:00:00

## 2018-01-05 MED ORDER — ACETAMINOPHEN 325 MG PO TABS: 650.0000 mg | ORAL_TABLET | ORAL | Status: DC | PRN

## 2018-01-05 MED ORDER — FENTANYL CITRATE (PF) 250 MCG/5ML IJ SOLN
INTRAMUSCULAR | Status: AC
  Filled 2018-01-05: qty 5

## 2018-01-05 MED ORDER — FAMOTIDINE 20 MG PO TABS
20.0000 mg | ORAL_TABLET | Freq: Every day | ORAL | Status: DC
  Administered 2018-01-06: 20 mg via ORAL
  Filled 2018-01-05: qty 1

## 2018-01-05 MED ORDER — FENTANYL CITRATE (PF) 100 MCG/2ML IJ SOLN: 25.0000 ug | INTRAMUSCULAR | Status: DC | PRN

## 2018-01-05 MED ORDER — HYDROMORPHONE HCL 1 MG/ML IJ SOLN
1.0000 mg | INTRAMUSCULAR | Status: DC | PRN
  Administered 2018-01-05 – 2018-01-06 (×2): 1 mg via INTRAVENOUS
  Filled 2018-01-05 (×2): qty 1

## 2018-01-05 MED ORDER — PROPOFOL 10 MG/ML IV BOLUS
INTRAVENOUS | Status: DC | PRN
  Administered 2018-01-05: 200 mg via INTRAVENOUS

## 2018-01-05 MED ORDER — MEPERIDINE HCL 50 MG/ML IJ SOLN: 6.2500 mg | INTRAMUSCULAR | Status: DC | PRN

## 2018-01-05 MED ORDER — ONDANSETRON HCL 4 MG/2ML IJ SOLN
INTRAMUSCULAR | Status: AC
  Filled 2018-01-05: qty 2

## 2018-01-05 MED ORDER — OXYCODONE HCL 5 MG PO TABS: 5.0000 mg | ORAL_TABLET | Freq: Once | ORAL | Status: DC | PRN

## 2018-01-05 MED ORDER — DIAZEPAM 5 MG PO TABS
5.0000 mg | ORAL_TABLET | Freq: Four times a day (QID) | ORAL | Status: DC | PRN
  Administered 2018-01-05 – 2018-01-06 (×3): 5 mg via ORAL
  Filled 2018-01-05 (×2): qty 1

## 2018-01-05 MED ORDER — LOSARTAN POTASSIUM 50 MG PO TABS
50.0000 mg | ORAL_TABLET | Freq: Every day | ORAL | Status: DC
  Administered 2018-01-05 – 2018-01-06 (×2): 50 mg via ORAL
  Filled 2018-01-05 (×2): qty 1

## 2018-01-05 MED ORDER — TAMSULOSIN HCL 0.4 MG PO CAPS
0.4000 mg | ORAL_CAPSULE | Freq: Every day | ORAL | Status: DC
Start: 2018-01-06 — End: 2018-01-06
  Administered 2018-01-06: 0.4 mg via ORAL
  Filled 2018-01-05: qty 1

## 2018-01-05 MED ORDER — PROPOFOL 10 MG/ML IV BOLUS
INTRAVENOUS | Status: AC
  Filled 2018-01-05: qty 20

## 2018-01-05 MED ORDER — ONDANSETRON HCL 4 MG PO TABS: 4.0000 mg | ORAL_TABLET | Freq: Four times a day (QID) | ORAL | Status: DC | PRN

## 2018-01-05 MED ORDER — SUFENTANIL CITRATE 50 MCG/ML IV SOLN
INTRAVENOUS | Status: DC | PRN
  Administered 2018-01-05 (×2): 10 ug via INTRAVENOUS
  Administered 2018-01-05 (×2): 5 ug via INTRAVENOUS
  Administered 2018-01-05 (×2): 10 ug via INTRAVENOUS

## 2018-01-05 MED ORDER — BISACODYL 10 MG RE SUPP: 10.0000 mg | Freq: Every day | RECTAL | Status: DC | PRN

## 2018-01-05 MED ORDER — EPHEDRINE 5 MG/ML INJ
INTRAVENOUS | Status: AC
  Filled 2018-01-05: qty 10

## 2018-01-05 MED ORDER — SUFENTANIL CITRATE 50 MCG/ML IV SOLN
INTRAVENOUS | Status: AC
  Filled 2018-01-05: qty 1

## 2018-01-05 MED ORDER — FENTANYL CITRATE (PF) 100 MCG/2ML IJ SOLN
INTRAMUSCULAR | Status: AC
  Administered 2018-01-05: 50 ug via INTRAVENOUS
  Filled 2018-01-05: qty 2

## 2018-01-05 MED ORDER — BUPIVACAINE HCL (PF) 0.25 % IJ SOLN
INTRAMUSCULAR | Status: AC
  Filled 2018-01-05: qty 30

## 2018-01-05 MED ORDER — FENOFIBRATE 160 MG PO TABS
160.0000 mg | ORAL_TABLET | Freq: Every day | ORAL | Status: DC
  Administered 2018-01-06: 160 mg via ORAL
  Filled 2018-01-05: qty 1

## 2018-01-05 MED ORDER — SODIUM CHLORIDE 0.9% FLUSH
3.0000 mL | Freq: Two times a day (BID) | INTRAVENOUS | Status: DC
  Administered 2018-01-05 (×2): 3 mL via INTRAVENOUS

## 2018-01-05 MED ORDER — POLYETHYLENE GLYCOL 3350 17 G PO PACK: 17.0000 g | PACK | Freq: Every day | ORAL | Status: DC | PRN

## 2018-01-05 MED ORDER — FLEET ENEMA 7-19 GM/118ML RE ENEM: 1.0000 | ENEMA | Freq: Once | RECTAL | Status: DC | PRN

## 2018-01-05 MED ORDER — LACTATED RINGERS IV SOLN
INTRAVENOUS | Status: DC
  Administered 2018-01-05: 08:00:00 via INTRAVENOUS

## 2018-01-05 MED ORDER — LIDOCAINE HCL (CARDIAC) PF 100 MG/5ML IV SOSY
PREFILLED_SYRINGE | INTRAVENOUS | Status: DC | PRN
  Administered 2018-01-05: 40 mg via INTRAVENOUS

## 2018-01-05 MED ORDER — MIDAZOLAM HCL 2 MG/2ML IJ SOLN
INTRAMUSCULAR | Status: AC
  Filled 2018-01-05: qty 2

## 2018-01-05 MED ORDER — TIZANIDINE HCL 4 MG PO TABS
4.0000 mg | ORAL_TABLET | Freq: Three times a day (TID) | ORAL | Status: DC
  Administered 2018-01-05 – 2018-01-06 (×3): 4 mg via ORAL
  Filled 2018-01-05 (×3): qty 1

## 2018-01-05 MED ORDER — SODIUM CHLORIDE 0.9 % IV SOLN
250.0000 mL | INTRAVENOUS | Status: DC
  Administered 2018-01-05: 250 mL via INTRAVENOUS

## 2018-01-05 MED ORDER — FENTANYL CITRATE (PF) 100 MCG/2ML IJ SOLN
50.0000 ug | Freq: Once | INTRAMUSCULAR | Status: AC
  Administered 2018-01-05: 50 ug via INTRAVENOUS

## 2018-01-05 MED ORDER — LOSARTAN POTASSIUM-HCTZ 50-12.5 MG PO TABS: 1.0000 | ORAL_TABLET | Freq: Every day | ORAL | Status: DC

## 2018-01-05 MED ORDER — ACETAMINOPHEN 325 MG PO TABS: 325.0000 mg | ORAL_TABLET | ORAL | Status: DC | PRN

## 2018-01-05 MED ORDER — SUGAMMADEX SODIUM 500 MG/5ML IV SOLN
INTRAVENOUS | Status: DC | PRN
  Administered 2018-01-05: 300 mg via INTRAVENOUS

## 2018-01-05 SURGICAL SUPPLY — 61 items
BAG DECANTER FOR FLEXI CONT (MISCELLANEOUS) ×2 IMPLANT
BENZOIN TINCTURE PRP APPL 2/3 (GAUZE/BANDAGES/DRESSINGS) ×2 IMPLANT
BLADE CLIPPER SURG (BLADE) IMPLANT
BUR CUTTER 7.0 ROUND (BURR) IMPLANT
BUR MATCHSTICK NEURO 3.0 LAGG (BURR) ×2 IMPLANT
CANISTER SUCT 3000ML PPV (MISCELLANEOUS) ×2 IMPLANT
CAP LCK SPNE (Orthopedic Implant) ×4 IMPLANT
CAP LOCK SPINE RADIUS (Orthopedic Implant) ×4 IMPLANT
CAP LOCKING (Orthopedic Implant) ×4 IMPLANT
CARTRIDGE OIL MAESTRO DRILL (MISCELLANEOUS) ×1 IMPLANT
CONT SPEC 4OZ CLIKSEAL STRL BL (MISCELLANEOUS) ×2 IMPLANT
COVER BACK TABLE 60X90IN (DRAPES) ×2 IMPLANT
COVER WAND RF STERILE (DRAPES) ×2 IMPLANT
DECANTER SPIKE VIAL GLASS SM (MISCELLANEOUS) ×2 IMPLANT
DERMABOND ADVANCED (GAUZE/BANDAGES/DRESSINGS) ×1
DERMABOND ADVANCED .7 DNX12 (GAUZE/BANDAGES/DRESSINGS) ×1 IMPLANT
DEVICE INTERBODY ELEVATE 9X23 (Cage) ×4 IMPLANT
DIFFUSER DRILL AIR PNEUMATIC (MISCELLANEOUS) ×2 IMPLANT
DRAPE C-ARM 42X72 X-RAY (DRAPES) ×4 IMPLANT
DRAPE HALF SHEET 40X57 (DRAPES) IMPLANT
DRAPE LAPAROTOMY 100X72X124 (DRAPES) ×2 IMPLANT
DRAPE SURG 17X23 STRL (DRAPES) ×8 IMPLANT
DRSG OPSITE POSTOP 4X6 (GAUZE/BANDAGES/DRESSINGS) ×2 IMPLANT
DURAPREP 26ML APPLICATOR (WOUND CARE) ×2 IMPLANT
ELECT REM PT RETURN 9FT ADLT (ELECTROSURGICAL) ×2
ELECTRODE REM PT RTRN 9FT ADLT (ELECTROSURGICAL) ×1 IMPLANT
EVACUATOR 1/8 PVC DRAIN (DRAIN) IMPLANT
GAUZE 4X4 16PLY RFD (DISPOSABLE) IMPLANT
GAUZE SPONGE 4X4 12PLY STRL (GAUZE/BANDAGES/DRESSINGS) IMPLANT
GLOVE ECLIPSE 9.0 STRL (GLOVE) ×6 IMPLANT
GLOVE EXAM NITRILE LRG STRL (GLOVE) IMPLANT
GLOVE EXAM NITRILE XL STR (GLOVE) IMPLANT
GLOVE EXAM NITRILE XS STR PU (GLOVE) IMPLANT
GLOVE SS BIOGEL STRL SZ 6.5 (GLOVE) ×3 IMPLANT
GLOVE SUPERSENSE BIOGEL SZ 6.5 (GLOVE) ×3
GOWN STRL REUS W/ TWL LRG LVL3 (GOWN DISPOSABLE) ×2 IMPLANT
GOWN STRL REUS W/ TWL XL LVL3 (GOWN DISPOSABLE) ×2 IMPLANT
GOWN STRL REUS W/TWL 2XL LVL3 (GOWN DISPOSABLE) IMPLANT
GOWN STRL REUS W/TWL LRG LVL3 (GOWN DISPOSABLE) ×2
GOWN STRL REUS W/TWL XL LVL3 (GOWN DISPOSABLE) ×2
KIT BASIN OR (CUSTOM PROCEDURE TRAY) ×2 IMPLANT
KIT TURNOVER KIT B (KITS) ×2 IMPLANT
MILL MEDIUM DISP (BLADE) ×2 IMPLANT
NEEDLE HYPO 22GX1.5 SAFETY (NEEDLE) ×2 IMPLANT
NS IRRIG 1000ML POUR BTL (IV SOLUTION) ×2 IMPLANT
OIL CARTRIDGE MAESTRO DRILL (MISCELLANEOUS) ×2
PACK LAMINECTOMY NEURO (CUSTOM PROCEDURE TRAY) ×2 IMPLANT
PATTIES SURGICAL 1X1 (DISPOSABLE) ×2 IMPLANT
ROD RADIUS 35MM (Rod) ×4 IMPLANT
SCREW 6.75X40MM (Screw) ×4 IMPLANT
SCREW 6.75X45MM (Screw) ×4 IMPLANT
SPONGE SURGIFOAM ABS GEL 100 (HEMOSTASIS) ×2 IMPLANT
STRIP CLOSURE SKIN 1/2X4 (GAUZE/BANDAGES/DRESSINGS) ×2 IMPLANT
SUT VIC AB 0 CT1 18XCR BRD8 (SUTURE) ×2 IMPLANT
SUT VIC AB 0 CT1 8-18 (SUTURE) ×2
SUT VIC AB 2-0 CT1 18 (SUTURE) ×2 IMPLANT
SUT VIC AB 3-0 SH 8-18 (SUTURE) ×4 IMPLANT
TOWEL GREEN STERILE (TOWEL DISPOSABLE) ×2 IMPLANT
TOWEL GREEN STERILE FF (TOWEL DISPOSABLE) ×2 IMPLANT
TRAY FOLEY MTR SLVR 16FR STAT (SET/KITS/TRAYS/PACK) ×2 IMPLANT
WATER STERILE IRR 1000ML POUR (IV SOLUTION) ×2 IMPLANT

## 2018-01-05 NOTE — H&P (Signed)
Rodney Clark is an 62 y.o. male.   Chief Complaint: Back pain HPI: 62 year old male with chronic and progressive severe mechanical back pain with intermittent radiation down both lower extremities.  Work-up demonstrates evidence of a mobile lytic spondylolisthesis at L5-S1 with severe accompanying disc degeneration.  Patient has failed conservative management.  He presents now for L5-S1 decompression and fusion in hopes of improving his symptoms.  Past Medical History:  Diagnosis Date  . Allergic state   . Arthritis   . Atypical chest pain   . GERD (gastroesophageal reflux disease)   . Gout   . Hyperglycemia   . Hyperlipidemia   . Hypertension   . Numbness of toes   . Sleep apnea     Past Surgical History:  Procedure Laterality Date  . APPENDECTOMY    . COLONOSCOPY N/A 05/14/2015   Procedure: COLONOSCOPY;  Surgeon: Rodney Silvas, MD;  Location: [facility] ENDOSCOPY;  Service: Endoscopy;  Laterality: N/A;  . HERNIA REPAIR    . ROTATOR CUFF REPAIR Right   . TONSILLECTOMY      History reviewed. No pertinent family history. Social History:  reports that he has never smoked. He has never used smokeless tobacco. He reports that he drinks about 3.0 standard drinks of alcohol per week. He reports that he does not use drugs.  Allergies:  Allergies  Allergen Reactions  . Crestor [Rosuvastatin] Other (See Comments)    Joint pain    Medications Prior to Admission  Medication Sig Dispense Refill  . acetaminophen (TYLENOL) 500 MG tablet Take 500-1,000 mg by mouth every 6 (six) hours as needed (for pain.).    Marland Kitchen famotidine (PEPCID) 20 MG tablet Take 20 mg by mouth daily.     . fenofibrate 160 MG tablet Take 160 mg by mouth daily.    . fexofenadine (ALLEGRA) 180 MG tablet Take 180 mg by mouth daily.    Marland Kitchen gabapentin (NEURONTIN) 300 MG capsule Take 300 mg by mouth 3 (three) times daily.    Marland Kitchen gabapentin (NEURONTIN) 600 MG tablet Take 600 mg by mouth 3 (three) times daily.    Marland Kitchen  losartan-hydrochlorothiazide (HYZAAR) 50-12.5 MG tablet Take 1 tablet by mouth daily.    . meloxicam (MOBIC) 15 MG tablet Take 15 mg by mouth daily.    . methylPREDNISolone (MEDROL) 4 MG tablet Take 4-24 mg by mouth See admin instructions. Take these numbers of tablets on consecutive days 6-5-4-3-2-1 until finished.  0  . tiZANidine (ZANAFLEX) 4 MG tablet Take 4 mg by mouth 3 (three) times daily.  4    No results found for this or any previous visit (from the past 48 hour(s)). No results found.  Pertinent items noted in HPI and remainder of comprehensive ROS otherwise negative.  Blood pressure 136/71, pulse 60, temperature 97.9 F (36.6 C), temperature source Oral, resp. rate 20, height 5\' 10"  (1.778 m), weight 120.4 kg, SpO2 94 %.  Patient is awake and alert.  He is oriented and appropriate.  Speech is fluent.  Judgment and insight are intact.  Cranial nerve function normal bilateral.  Motor examination with intact motor strength bilateral.  Sensory examination intact bilateral.  Deep tender mixes normal active except absent at both Achilles tendons.  Gait is antalgic.  Posture is moderately flexed.  Examination head ears eyes nose throat is unremarkable.  Chest and abdomen are benign.  Extremities are free from injury deformity. Assessment/Plan Grade 1 L5-S1 lytic mobile spondylolisthesis with back pain and radiculopathy.  Plan L5-S1 Rodney Clark  procedure with L5-S1 posterior lumbar interbody fusion utilizing interbody cages, locally harvested autograft, coupled with posterior lateral arthrodesis utilizing nonsegmental pedicle screw fixation and local autografting.  Risks and benefits of been explained.  Patient wishes to proceed.  Rodney Clark A Rodney Clark 01/05/2018, 9:15 AM

## 2018-01-05 NOTE — Anesthesia Procedure Notes (Signed)
Procedure Name: Intubation Date/Time: 01/05/2018 9:39 AM Performed by: Eligha Bridegroom, CRNA Pre-anesthesia Checklist: Patient identified, Emergency Drugs available, Suction available, Patient being monitored and Timeout performed Patient Re-evaluated:Patient Re-evaluated prior to induction Oxygen Delivery Method: Circle system utilized Preoxygenation: Pre-oxygenation with 100% oxygen Induction Type: IV induction Ventilation: Mask ventilation without difficulty and Oral airway inserted - appropriate to patient size Laryngoscope Size: Mac and 4 Grade View: Grade III Tube type: Oral Tube size: 7.5 mm Number of attempts: 1 Airway Equipment and Method: Stylet Placement Confirmation: ETT inserted through vocal cords under direct vision,  positive ETCO2 and breath sounds checked- equal and bilateral Secured at: 22 cm Tube secured with: Tape Dental Injury: Teeth and Oropharynx as per pre-operative assessment

## 2018-01-05 NOTE — Plan of Care (Signed)
  Problem: Safety: Goal: Ability to remain free from injury will improve Outcome: Progressing   

## 2018-01-05 NOTE — Anesthesia Preprocedure Evaluation (Addendum)
Anesthesia Evaluation  Patient identified by MRN, date of birth, ID band Patient awake    Reviewed: Allergy & Precautions, NPO status , Patient's Chart, lab work & pertinent test results  Airway Mallampati: II  TM Distance: >3 FB Neck ROM: Full    Dental  (+) Chipped, Teeth Intact, Caps   Pulmonary sleep apnea ,  Sinus allergies   Pulmonary exam normal breath sounds clear to auscultation       Cardiovascular hypertension, Pt. on medications Normal cardiovascular exam  Echo 14 Summary:   1. Left ventricular ejection fraction, by visual estimation, is 70 to  75%.   2. Normal global left ventricular systolic function.   3. Mild mitral valve regurgitation.   4. Mild aortic valve sclerosis without stenosis.   5. Moderately increased left ventricular posterior wall thickness.   6. Mild tricuspid regurgitation.    Neuro/Psych Numbness of toes negative psych ROS   GI/Hepatic Neg liver ROS, GERD  Medicated and Controlled,  Endo/Other  Morbid obesity  Renal/GU negative Renal ROS     Musculoskeletal  (+) Arthritis , Osteoarthritis,    Abdominal Normal abdominal exam  (+)   Peds negative pediatric ROS (+)  Hematology negative hematology ROS (+)   Anesthesia Other Findings   Reproductive/Obstetrics negative OB ROS                            Anesthesia Physical  Anesthesia Plan  ASA: III  Anesthesia Plan: General   Post-op Pain Management:    Induction: Intravenous  PONV Risk Score and Plan: 1 and Ondansetron and Treatment may vary due to age or medical condition  Airway Management Planned: Oral ETT  Additional Equipment:   Intra-op Plan:   Post-operative Plan: Extubation in OR  Informed Consent: I have reviewed the patients History and Physical, chart, labs and discussed the procedure including the risks, benefits and alternatives for the proposed anesthesia with the patient or  authorized representative who has indicated his/her understanding and acceptance.   Dental advisory given  Plan Discussed with: CRNA, Surgeon and Anesthesiologist  Anesthesia Plan Comments: (  )       Anesthesia Quick Evaluation

## 2018-01-05 NOTE — Anesthesia Postprocedure Evaluation (Signed)
Anesthesia Post Note  Patient: Rodney Clark  Procedure(s) Performed: POSTERIOR LUMBAR INTERBODY FUSION - LUMBAR FIVE-SACRAL ONE (N/A Back)     Patient location during evaluation: PACU Anesthesia Type: General Level of consciousness: awake and alert Pain management: pain level controlled Vital Signs Assessment: post-procedure vital signs reviewed and stable Respiratory status: spontaneous breathing, nonlabored ventilation, respiratory function stable and patient connected to nasal cannula oxygen Cardiovascular status: blood pressure returned to baseline and stable Postop Assessment: no apparent nausea or vomiting Anesthetic complications: no    Last Vitals:  Vitals:   01/05/18 1400 01/05/18 1417  BP:  (!) 147/89  Pulse: 90 87  Resp: 13 19  Temp:  36.5 C  SpO2: 97% 97%    Last Pain:  Vitals:   01/05/18 1417  TempSrc: Oral  PainSc:                  Sereena Marando

## 2018-01-05 NOTE — Transfer of Care (Signed)
Immediate Anesthesia Transfer of Care Note  Patient: Rodney Clark  Procedure(s) Performed: POSTERIOR LUMBAR INTERBODY FUSION - LUMBAR FIVE-SACRAL ONE (N/A Back)  Patient Location: PACU  Anesthesia Type:General  Level of Consciousness: awake, alert  and oriented  Airway & Oxygen Therapy: Patient Spontanous Breathing and Patient connected to nasal cannula oxygen  Post-op Assessment: Report given to RN and Post -op Vital signs reviewed and stable  Post vital signs: Reviewed and stable  Last Vitals:  Vitals Value Taken Time  BP    Temp    Pulse 94 01/05/2018  1:06 PM  Resp 15 01/05/2018  1:06 PM  SpO2 96 % 01/05/2018  1:06 PM  Vitals shown include unvalidated device data.  Last Pain:  Vitals:   01/05/18 0751  TempSrc:   PainSc: 7          Complications: No apparent anesthesia complications

## 2018-01-05 NOTE — Brief Op Note (Signed)
01/05/2018  12:39 PM  PATIENT:  Rodney Clark  62 y.o. male  PRE-OPERATIVE DIAGNOSIS:  Spondylolisthesis  POST-OPERATIVE DIAGNOSIS:  Spondylolisthesis  PROCEDURE:  Procedure(s): POSTERIOR LUMBAR INTERBODY FUSION - LUMBAR FIVE-SACRAL ONE (N/A)  SURGEON:  Surgeon(s) and Role:    * Karem Tomaso, Mallie Mussel, MD - Primary    * Ostergard, Joyice Faster, MD - Assisting  PHYSICIAN ASSISTANT:   ASSISTANTSMearl Latin    ANESTHESIA:   general  EBL:  700 mL   BLOOD ADMINISTERED:none  DRAINS: none   LOCAL MEDICATIONS USED:  MARCAINE     SPECIMEN:  No Specimen  DISPOSITION OF SPECIMEN:  N/A  COUNTS:  YES  TOURNIQUET:  * No tourniquets in log *  DICTATION: .Dragon Dictation  PLAN OF CARE: Admit to inpatient   PATIENT DISPOSITION:  PACU - hemodynamically stable.   Delay start of Pharmacological VTE agent (>24hrs) due to surgical blood loss or risk of bleeding: yes

## 2018-01-05 NOTE — Op Note (Signed)
Date of procedure: 01/05/2018  Date of dictation: Same  Service: Neurosurgery  Preoperative diagnosis: Grade 1 L5-S1 lytic, mobile spondylolisthesis with back pain and radiculopathy  Postoperative diagnosis: Same  Procedure Name: L5 Gill procedure with bilateral L5 and S1 decompressive foraminotomies, more than would be required for simple interbody fusion alone.  L5-S1 posterior lumbar interbody fusion utilizing interbody cages, locally harvested autograft,  L5-S1 posterior lateral arthrodesis utilizing nonsegmental pedicle screw fixation and local autograft  Surgeon:Jann Milkovich A.Cloteal Isaacson, M.D.  Asst. Surgeon: Montez Hageman, NP  Anesthesia: General  Indication: 62 year old male with severe back and right lower extremity pain failing conservative management.  Work-up demonstrates evidence of an unstable grade 1 L5-S1 lytic spondylolisthesis with severe right-sided L5 foraminal stenosis.  Patient is failed conservative management presents now for decompression and fusion in hopes of improving his symptoms.  Operative note: After induction anesthesia, patient position prone on the Wilson frame and properly padded.  Lumbar region prepped and draped sterilely.  Incision made overlying L5-S1.  Dissection performed bilaterally.  Retractor placed.  Fluoroscopy used.  Level confirmed.  Gill procedure was then performed by using Vickie Epley and Kerrison Rogers to remove the entire lamina and spinous process of L5 including the inferior facets of L5 bilaterally.  Partial superior facet resection of S1 was performed bilaterally.  The L5 nerve roots were further decompressed by removing rudimentary bone and pannus of scar tissue overlying the L5 nerve roots.  This completed a Gill procedure.  Bilateral discectomies then performed at L5-S1.  The space then prepared for interbody fusion.  With the disc space distracted on the patient's right side disc space was further curettage and cleaned of all soft  tissue.  A 9 mm Medtronic extra lordotic expandable cage packed with locally harvested autograft was then impacted into place and expanded to its full extent.  Attention then placed to the contralateral side.  Distractor was removed and the patient's right side.  The space then prepared on the right side.  Soft tissue was removed.  Morselized autograft harvested from the previous decompression was then packed into the interspace.  A second cage was then impacted into place and expanded to its full extent.  Pedicles of L5 and S1 were then identified using surface landmarks and intraoperative fluoroscopy.  Superficial bone overlying the pedicle was then removed with a high-speed drill.  Pedicle was then probed using pedicle awl.  Each pedicle all track was then probed and found to be solidly within the bone.  6.75 mm radius bran screws from [employer] were placed bilaterally at L5 and S1.  Final images reveal good position of the cages and the hardware at the proper upper level with normal alignment of spine.  Transverse processes and residual facets were decorticated.  Morselized autograft was packed posterior laterally for later fusion.  Short segment titanium rods and placed up the screw heads at L5 and S1.  Locking caps then placed over the screws.  Locking caps were then engaged with a construct under mild compression.  Gelfoam was placed over the laminotomy defects.  Vancomycin powder placed the deep wound space.  Wounds and closed in layers with Vicryl sutures.  Steri-Strips and sterile dressing were applied.  No apparent complications.  Patient tolerated the procedure well and he returns to the recovery room postop.

## 2018-01-06 LAB — POCT I-STAT 4, (NA,K, GLUC, HGB,HCT)
GLUCOSE: 100 mg/dL — AB (ref 70–99)
HCT: 40 % (ref 39.0–52.0)
Hemoglobin: 13.6 g/dL (ref 13.0–17.0)
POTASSIUM: 3.6 mmol/L (ref 3.5–5.1)
SODIUM: 139 mmol/L (ref 135–145)

## 2018-01-06 MED ORDER — CARISOPRODOL 350 MG PO TABS: 350.0000 mg | ORAL_TABLET | Freq: Four times a day (QID) | ORAL | 0 refills | Status: DC | PRN

## 2018-01-06 MED ORDER — OXYCODONE HCL 10 MG PO TABS: 5.0000 mg | ORAL_TABLET | ORAL | 0 refills | Status: DC | PRN

## 2018-01-06 MED FILL — Thrombin For Soln Kit 20000 Unit: CUTANEOUS | Qty: 1 | Status: AC

## 2018-01-06 NOTE — Discharge Instructions (Signed)

## 2018-01-06 NOTE — Evaluation (Signed)
Physical Therapy Evaluation/Discharge  Patient Details Name: MAT STUARD MRN: [id number] DOB: 05-29-1955 Today's Date: 01/06/2018   History of Present Illness  Pt is 62 yo male presenting with spondylolethesis s/p posterior fusion of L5-S1. PMHx: OA, HLD, HTN, sleep apnea, gout, atypical chest pain, GERD, hyperglycemia, Rt RC repair, numbness in toes.   Clinical Impression  Pt pleasant on arrival, reporting had been up walking several times. Pt mod I for all bed mobility, transfers and ambulation. Pt able to navigate stairs with supervision, overall steady. Reviewed all back precautions with handout provided, brace donned properly. All educated completed this session, with no further acute therapy needs for mobility at this time.     Follow Up Recommendations No PT follow up    Equipment Recommendations  None recommended by PT    Recommendations for Other Services       Precautions / Restrictions Precautions Precautions: Back Precaution Booklet Issued: Yes (comment) Precaution Comments: No BLT, pt able to recall at end of session.  Required Braces or Orthoses: Spinal Brace Spinal Brace: Lumbar corset;Applied in sitting position Restrictions Weight Bearing Restrictions: No      Mobility  Bed Mobility Overal bed mobility: Modified Independent             General bed mobility comments: use of rail, pt able to demonstrate log roll technique, with vc to reach over with arm and not pull/push on rail  Transfers Overall transfer level: Independent Equipment used: None Transfers: Sit to/from [employer] transfer comment: proper hand placement on bed, no LOB, able to adhere to precautions throughout transfer   Ambulation/Gait Ambulation/Gait assistance: Modified independent (Device/Increase time) Gait Distance (Feet): 400 Feet Assistive device: None Gait Pattern/deviations: WFL(Within Functional Limits);Step-through pattern   Gait velocity  interpretation: >2.62 ft/sec, indicative of community ambulatory General Gait Details: steady, tendency to reach for external support, pt reports is habit, no balance deficit noted and able to walk without support.   Stairs Stairs: Yes Stairs assistance: Supervision Stair Management: One rail Right;Alternating pattern Number of Stairs: 8 General stair comments: steady, cues for step to pattern if pain levels are high.   Wheelchair Mobility    Modified Rankin (Stroke Patients Only)       Balance Overall balance assessment: Independent(can stand without support, tendency for single UE support in standing and ambulation )                                           Pertinent Vitals/Pain Pain Assessment: Faces Pain Score: 2  Faces Pain Scale: Hurts a little bit Pain Location: incision site  Pain Descriptors / Indicators: Discomfort;Guarding Pain Intervention(s): Monitored during session;Repositioned    Home Living Family/patient expects to be discharged to:: Private residence Living Arrangements: Spouse/significant other Available Help at Discharge: Family;Available PRN/intermittently(will have assist for ADLs) Type of Home: House Home Access: Stairs to enter Entrance Stairs-Rails: Right Entrance Stairs-Number of Steps: 3 Home Layout: Two level;1/2 bath on main level;Bed/bath upstairs;Able to live on main level with bedroom/bathroom Home Equipment: Shower seat;Other (comment);Adaptive equipment(stand tall walker ) Additional Comments: sits on edge of tub to bathe     Prior Function Level of Independence: Independent         Comments: retired, but working part time, driving.      Hand Dominance   Dominant Hand: Right  Extremity/Trunk Assessment   Upper Extremity Assessment Upper Extremity Assessment: Overall WFL for tasks assessed    Lower Extremity Assessment Lower Extremity Assessment: Defer to PT evaluation    Cervical / Trunk  Assessment Cervical / Trunk Assessment: Normal Cervical / Trunk Exceptions: s/p spinal surgery  Communication   Communication: No difficulties  Cognition Arousal/Alertness: Awake/alert Behavior During Therapy: WFL for tasks assessed/performed Overall Cognitive Status: Within Functional Limits for tasks assessed                                        General Comments General comments (skin integrity, edema, etc.): spouse present and supportive    Exercises     Assessment/Plan    PT Assessment Patent does not need any further PT services  PT Problem List         PT Treatment Interventions      PT Goals (Current goals can be found in the Care Plan section)  Acute Rehab PT Goals Patient Stated Goal: return home. achieve prior function.   PT Goal Formulation: With patient Time For Goal Achievement: 01/20/18 Potential to Achieve Goals: Good    Frequency     Barriers to discharge        Co-evaluation               AM-PAC PT "6 Clicks" Daily Activity  Outcome Measure Difficulty turning over in bed (including adjusting bedclothes, sheets and blankets)?: A Little Difficulty moving from lying on back to sitting on the side of the bed? : None Difficulty sitting down on and standing up from a chair with arms (e.g., wheelchair, bedside commode, etc,.)?: None Help needed moving to and from a bed to chair (including a wheelchair)?: None Help needed walking in hospital room?: None Help needed climbing 3-5 steps with a railing? : A Little 6 Click Score: 22    End of Session Equipment Utilized During Treatment: Gait belt;Back brace Activity Tolerance: Patient tolerated treatment well Patient left: in chair;with call bell/phone within reach Nurse Communication: Mobility status PT Visit Diagnosis: Other abnormalities of gait and mobility (R26.89)    Time: 5035-4656 PT Time Calculation (min) (ACUTE ONLY): 25 min   Charges:   PT Evaluation $PT Eval Low  Complexity: 1 Low PT Treatments $Gait Training: 8-22 mins        Samuella Bruin, [place]  Acute Rehab [phone]   Samuella Bruin 01/06/2018, 10:51 AM

## 2018-01-06 NOTE — Progress Notes (Signed)
Patient alert and oriented, mae's well, voiding adequate amount of urine, swallowing without difficulty, no c/o pain at time of discharge. Patient discharged home with family. Script and discharged instructions given to patient. Patient and family stated understanding of instructions given. Patient has an appointment with Dr. Pool  

## 2018-01-06 NOTE — Discharge Summary (Signed)
Physician Discharge Summary  Patient ID: Rodney Clark MRN: [id number] DOB/AGE: 09-07-1955 62 y.o.  Admit date: 01/05/2018 Discharge date: 01/06/2018  Admission Diagnoses:  Discharge Diagnoses:  Active Problems:   Spondylolisthesis at L5-S1 level   Discharged Condition: good  Hospital Course: Patient admitted to the hospital where he underwent uncomplicated lumbar decompression and fusion surgery at L5-S1.  Postoperatively doing very well.  Preoperative back and lower extremity pain much improved.  Standing and ambulating without difficulty.  Ready for discharge home.  Consults:   Significant Diagnostic Studies:   Treatments:   Discharge Exam: Blood pressure 120/71, pulse 68, temperature 98.3 F (36.8 C), temperature source Oral, resp. rate 20, height 5\' 10"  (1.778 m), weight 120.4 kg, SpO2 96 %. Awake and alert.  Oriented and appropriate.  Motor and sensory function intact.  Wound clean and dry.  Chest and abdomen benign.  Disposition: Discharge disposition: 01-Home or Self Care        Allergies as of 01/06/2018      Reactions   Crestor [rosuvastatin] Other (See Comments)   Joint pain      Medication List    TAKE these medications   acetaminophen 500 MG tablet Commonly known as:  TYLENOL Take 500-1,000 mg by mouth every 6 (six) hours as needed (for pain.).   carisoprodol 350 MG tablet Commonly known as:  SOMA Take 1 tablet (350 mg total) by mouth 4 (four) times daily as needed for muscle spasms.   famotidine 20 MG tablet Commonly known as:  PEPCID Take 20 mg by mouth daily.   fenofibrate 160 MG tablet Take 160 mg by mouth daily.   fexofenadine 180 MG tablet Commonly known as:  ALLEGRA Take 180 mg by mouth daily.   gabapentin 600 MG tablet Commonly known as:  NEURONTIN Take 600 mg by mouth 3 (three) times daily.   gabapentin 300 MG capsule Commonly known as:  NEURONTIN Take 300 mg by mouth 3 (three) times daily.   losartan-hydrochlorothiazide  50-12.5 MG tablet Commonly known as:  HYZAAR Take 1 tablet by mouth daily.   meloxicam 15 MG tablet Commonly known as:  MOBIC Take 15 mg by mouth daily.   methylPREDNISolone 4 MG tablet Commonly known as:  MEDROL Take 4-24 mg by mouth See admin instructions. Take these numbers of tablets on consecutive days 6-5-4-3-2-1 until finished.   Oxycodone HCl 10 MG Tabs Take 0.5-1 tablets (5-10 mg total) by mouth every 4 (four) hours as needed for severe pain ((score 7 to 10)).   tiZANidine 4 MG tablet Commonly known as:  ZANAFLEX Take 4 mg by mouth 3 (three) times daily.            Durable Medical Equipment  (From admission, onward)         Start     Ordered   01/05/18 1317  DME Walker rolling  Once    Question:  Patient needs a walker to treat with the following condition  Answer:  Spondylolisthesis at L5-S1 level   01/05/18 1316   01/05/18 1317  DME 3 n 1  Once     01/05/18 1316           Signed: Mallie Mussel A Gisele Pack 01/06/2018, 10:45 AM

## 2018-01-06 NOTE — Evaluation (Signed)
Occupational Therapy Evaluation Patient Details Name: Rodney Clark MRN: [id number] DOB: [birth year] Today's Date: 01/06/2018    History of Present Illness Pt is 62 yo male presenting with spondylolethesis s/p posterior fusion of L5-S1. PMHx: OA, HLD, HTN, sleep apnea, gout, atypical chest pain, GERD, hyperglycemia, Rt RC repair, numbness in toes.    Clinical Impression   PTA patient independent.  Currently admitted for above and limited by pain, precautions and decreased activity tolerance. He demonstrates ability to complete grooming with supervision, UB ADLs with setup assist, LB ADLs with min to max assist, toilet transfers with supervision, and toileting with supervision.  Educated on precautions, brace management and wear schedule, energy conservation, ADL compensatory techniques, recommendations, and safety.  Pt's spouse plans to assist with ADLs as needed, including LB dressing and bathing in shower.  Based on performance today, no further OT needs identified and OT will sign off.  Thank you for this referral!     Follow Up Recommendations  No OT follow up;Supervision - Intermittent    Equipment Recommendations  None recommended by OT(has access to [place], reacher and sponge)    Recommendations for Other Services       Precautions / Restrictions Precautions Precautions: Back Precaution Booklet Issued: Yes (comment) Precaution Comments: pt able to recall precautions during session without cueing Required Braces or Orthoses: Spinal Brace Spinal Brace: Lumbar corset Restrictions Weight Bearing Restrictions: No      Mobility Bed Mobility Overal bed mobility: Modified Independent             General bed mobility comments: seated in recliner upon entry  Transfers Overall transfer level: Needs assistance Equipment used: None Transfers: Sit to/from Stand Sit to Stand: Modified independent (Device/Increase time);Supervision         General transfer comment: mod I from  recliner, supervision from commode    Balance Overall balance assessment: Independent(preference to UE support if available)                                         ADL either performed or assessed with clinical judgement   ADL Overall ADL's : Needs assistance/impaired     Grooming: Supervision/safety;Standing;Cueing for compensatory techniques Grooming Details (indicate cue type and reason): verbal cueing for compensatory techniques to adhere to precautions  Upper Body Bathing: Set up;Sitting   Lower Body Bathing: Minimal assistance;Sitting/lateral leans;Cueing for back precautions;Cueing for compensatory techniques;With adaptive equipment Lower Body Bathing Details (indicate cue type and reason): reviewed techniques bathing seated with lateral leans and long sponge, pt needs assist to reach B feet as unable to complete figure 4 technique  Upper Body Dressing : Set up;Supervision/safety;Sitting   Lower Body Dressing: Maximal assistance;Cueing for compensatory techniques;Cueing for back precautions;Sit to/from stand Lower Body Dressing Details (indicate cue type and reason): reviewed AE for LB dressing as unable to complete figure 4 technique, spouse plans to assist but has reacher as needed; once standing SPV Toilet Transfer: Sales executive;Ambulation Toilet Transfer Details (indicate cue type and reason): reviewed safety and technique, plans to use counter to support self during transfer [place] and Hygiene: Supervision/safety;Sit to/from stand;Cueing for compensatory techniques Toileting - Clothing Manipulation Details (indicate cue type and reason): reviewed safety with toileting and precautions  Tub/ Shower Transfer: Tub transfer;Shower seat;Ambulation Tub/Shower Transfer Details (indicate cue type and reason): simulated in room, educated on side step over threshold; spouse plans to assist as  needed Functional mobility  during ADLs: Supervision/safety General ADL Comments: Pt educated on precautions, ADl compensatory techniques and body mechanics.     Vision Baseline Vision/History: Wears glasses Patient Visual Report: No change from baseline Vision Assessment?: No apparent visual deficits     Perception     Praxis      Pertinent Vitals/Pain Pain Assessment: Faces Pain Score: 2  Faces Pain Scale: Hurts a little bit Pain Location: incision site  Pain Descriptors / Indicators: Discomfort;Guarding Pain Intervention(s): Monitored during session;Repositioned     Hand Dominance Right   Extremity/Trunk Assessment Upper Extremity Assessment Upper Extremity Assessment: Overall WFL for tasks assessed   Lower Extremity Assessment Lower Extremity Assessment: Defer to PT evaluation   Cervical / Trunk Assessment Cervical / Trunk Assessment: Other exceptions Cervical / Trunk Exceptions: s/p spinal surgery   Communication Communication Communication: No difficulties   Cognition Arousal/Alertness: Awake/alert Behavior During Therapy: WFL for tasks assessed/performed Overall Cognitive Status: Within Functional Limits for tasks assessed                                     General Comments  spouse present and supportive    Exercises     Shoulder Instructions      Home Living Family/patient expects to be discharged to:: Private residence Living Arrangements: Spouse/significant other Available Help at Discharge: Family;Available PRN/intermittently(will have assist for ADLs) Type of Home: House Home Access: Stairs to enter [employer] of Steps: 3 Entrance Stairs-Rails: Right Home Layout: Two level;1/2 bath on main level;Bed/bath upstairs;Able to live on main level with bedroom/bathroom Alternate Level Stairs-Number of Steps: flight Alternate Level Stairs-Rails: Right Bathroom Shower/Tub: Teacher, early years/pre: Standard Bathroom Accessibility: Yes    Home Equipment: Shower seat;Other (comment);Adaptive equipment(stand tall walker ) Adaptive Equipment: Reacher;Long-handled sponge Additional Comments: sits on edge of tub to bathe       Prior Functioning/Environment Level of Independence: Independent        Comments: retired, but working part time, driving.         OT Problem List: Decreased activity tolerance;Decreased safety awareness;Decreased knowledge of use of DME or AE;Decreased knowledge of precautions;Pain      OT Treatment/Interventions:      OT Goals(Current goals can be found in the care plan section) Acute Rehab OT Goals Patient Stated Goal: return home. achieve prior function.   OT Goal Formulation: With patient  OT Frequency:     Barriers to D/C:            Co-evaluation              AM-PAC PT "6 Clicks" Daily Activity     Outcome Measure Help from another person eating meals?: None Help from another person taking care of personal grooming?: None Help from another person toileting, which includes using toliet, bedpan, or urinal?: None Help from another person bathing (including washing, rinsing, drying)?: A Little Help from another person to put on and taking off regular upper body clothing?: None Help from another person to put on and taking off regular lower body clothing?: A Lot 6 Click Score: 21   End of Session Equipment Utilized During Treatment: Back brace Nurse Communication: Mobility status  Activity Tolerance: Patient tolerated treatment well Patient left: in chair;with call bell/phone within reach;with family/visitor present  OT Visit Diagnosis: Pain Pain - part of body: (back)  Time: 6659-9357 OT Time Calculation (min): 21 min Charges:  OT General Charges $OT Visit: 1 Visit OT Evaluation $OT Eval Low Complexity: [place], OT Acute Rehabilitation Services Pager [phone] Office [phone]   Delight Stare 01/06/2018, 10:00 AM

## 2018-01-08 MED FILL — Heparin Sodium (Porcine) Inj 1000 Unit/ML: INTRAMUSCULAR | Qty: 30 | Status: AC

## 2018-01-08 MED FILL — Sodium Chloride IV Soln 0.9%: INTRAVENOUS | Qty: 1000 | Status: AC

## 2018-01-28 DIAGNOSIS — G4733 Obstructive sleep apnea (adult) (pediatric): Secondary | ICD-10-CM | POA: Diagnosis not present

## 2018-01-28 DIAGNOSIS — I10 Essential (primary) hypertension: Secondary | ICD-10-CM | POA: Diagnosis not present

## 2018-01-28 DIAGNOSIS — K219 Gastro-esophageal reflux disease without esophagitis: Secondary | ICD-10-CM | POA: Diagnosis not present

## 2018-02-15 DIAGNOSIS — M25512 Pain in left shoulder: Secondary | ICD-10-CM | POA: Diagnosis not present

## 2018-02-16 DIAGNOSIS — M4317 Spondylolisthesis, lumbosacral region: Secondary | ICD-10-CM | POA: Diagnosis not present

## 2018-02-23 ENCOUNTER — Other Ambulatory Visit: Payer: Self-pay | Admitting: Orthopedic Surgery

## 2018-02-23 DIAGNOSIS — M25512 Pain in left shoulder: Secondary | ICD-10-CM

## 2018-03-17 DIAGNOSIS — M4317 Spondylolisthesis, lumbosacral region: Secondary | ICD-10-CM | POA: Diagnosis not present

## 2018-03-22 DIAGNOSIS — M545 Low back pain: Secondary | ICD-10-CM | POA: Diagnosis not present

## 2018-03-29 DIAGNOSIS — M545 Low back pain: Secondary | ICD-10-CM | POA: Diagnosis not present

## 2018-04-01 DIAGNOSIS — M545 Low back pain: Secondary | ICD-10-CM | POA: Diagnosis not present

## 2018-04-05 DIAGNOSIS — M545 Low back pain: Secondary | ICD-10-CM | POA: Diagnosis not present

## 2018-04-06 DIAGNOSIS — M545 Low back pain: Secondary | ICD-10-CM | POA: Diagnosis not present

## 2018-04-14 DIAGNOSIS — M545 Low back pain: Secondary | ICD-10-CM | POA: Diagnosis not present

## 2018-04-16 DIAGNOSIS — M545 Low back pain: Secondary | ICD-10-CM | POA: Diagnosis not present

## 2018-04-21 DIAGNOSIS — M545 Low back pain: Secondary | ICD-10-CM | POA: Diagnosis not present

## 2018-04-23 DIAGNOSIS — I10 Essential (primary) hypertension: Secondary | ICD-10-CM | POA: Diagnosis not present

## 2018-04-23 DIAGNOSIS — E7849 Other hyperlipidemia: Secondary | ICD-10-CM | POA: Diagnosis not present

## 2018-04-23 DIAGNOSIS — R739 Hyperglycemia, unspecified: Secondary | ICD-10-CM | POA: Diagnosis not present

## 2018-04-23 DIAGNOSIS — M1990 Unspecified osteoarthritis, unspecified site: Secondary | ICD-10-CM | POA: Diagnosis not present

## 2018-04-23 DIAGNOSIS — M519 Unspecified thoracic, thoracolumbar and lumbosacral intervertebral disc disorder: Secondary | ICD-10-CM | POA: Diagnosis not present

## 2018-04-28 DIAGNOSIS — I10 Essential (primary) hypertension: Secondary | ICD-10-CM | POA: Diagnosis not present

## 2018-04-28 DIAGNOSIS — Z6838 Body mass index (BMI) 38.0-38.9, adult: Secondary | ICD-10-CM | POA: Diagnosis not present

## 2018-04-28 DIAGNOSIS — M4317 Spondylolisthesis, lumbosacral region: Secondary | ICD-10-CM | POA: Diagnosis not present

## 2018-04-28 DIAGNOSIS — G4733 Obstructive sleep apnea (adult) (pediatric): Secondary | ICD-10-CM | POA: Diagnosis not present

## 2018-05-01 DIAGNOSIS — G4733 Obstructive sleep apnea (adult) (pediatric): Secondary | ICD-10-CM | POA: Diagnosis not present

## 2018-05-03 DIAGNOSIS — G4733 Obstructive sleep apnea (adult) (pediatric): Secondary | ICD-10-CM | POA: Diagnosis not present

## 2018-05-03 DIAGNOSIS — K219 Gastro-esophageal reflux disease without esophagitis: Secondary | ICD-10-CM | POA: Diagnosis not present

## 2018-05-03 DIAGNOSIS — I10 Essential (primary) hypertension: Secondary | ICD-10-CM | POA: Diagnosis not present

## 2018-05-28 DIAGNOSIS — G4733 Obstructive sleep apnea (adult) (pediatric): Secondary | ICD-10-CM | POA: Diagnosis not present

## 2018-06-16 DIAGNOSIS — S299XXA Unspecified injury of thorax, initial encounter: Secondary | ICD-10-CM | POA: Diagnosis not present

## 2018-06-16 DIAGNOSIS — W101XXA Fall (on)(from) sidewalk curb, initial encounter: Secondary | ICD-10-CM | POA: Diagnosis not present

## 2018-07-15 DIAGNOSIS — E7849 Other hyperlipidemia: Secondary | ICD-10-CM | POA: Diagnosis not present

## 2018-07-15 DIAGNOSIS — R739 Hyperglycemia, unspecified: Secondary | ICD-10-CM | POA: Diagnosis not present

## 2018-07-15 DIAGNOSIS — I10 Essential (primary) hypertension: Secondary | ICD-10-CM | POA: Diagnosis not present

## 2018-07-20 DIAGNOSIS — I10 Essential (primary) hypertension: Secondary | ICD-10-CM | POA: Diagnosis not present

## 2018-07-20 DIAGNOSIS — K219 Gastro-esophageal reflux disease without esophagitis: Secondary | ICD-10-CM | POA: Diagnosis not present

## 2018-07-20 DIAGNOSIS — G4733 Obstructive sleep apnea (adult) (pediatric): Secondary | ICD-10-CM | POA: Diagnosis not present

## 2018-07-27 DIAGNOSIS — G4733 Obstructive sleep apnea (adult) (pediatric): Secondary | ICD-10-CM | POA: Diagnosis not present

## 2018-07-30 DIAGNOSIS — M519 Unspecified thoracic, thoracolumbar and lumbosacral intervertebral disc disorder: Secondary | ICD-10-CM | POA: Diagnosis not present

## 2018-07-30 DIAGNOSIS — M792 Neuralgia and neuritis, unspecified: Secondary | ICD-10-CM | POA: Diagnosis not present

## 2018-07-30 DIAGNOSIS — R739 Hyperglycemia, unspecified: Secondary | ICD-10-CM | POA: Diagnosis not present

## 2018-08-02 DIAGNOSIS — M7521 Bicipital tendinitis, right shoulder: Secondary | ICD-10-CM | POA: Diagnosis not present

## 2018-08-02 DIAGNOSIS — M7522 Bicipital tendinitis, left shoulder: Secondary | ICD-10-CM | POA: Diagnosis not present

## 2018-08-02 DIAGNOSIS — M7542 Impingement syndrome of left shoulder: Secondary | ICD-10-CM | POA: Diagnosis not present

## 2018-08-04 DIAGNOSIS — M25512 Pain in left shoulder: Secondary | ICD-10-CM | POA: Diagnosis not present

## 2018-08-04 DIAGNOSIS — M25511 Pain in right shoulder: Secondary | ICD-10-CM | POA: Diagnosis not present

## 2018-08-09 DIAGNOSIS — M25511 Pain in right shoulder: Secondary | ICD-10-CM | POA: Diagnosis not present

## 2018-08-09 DIAGNOSIS — M25512 Pain in left shoulder: Secondary | ICD-10-CM | POA: Diagnosis not present

## 2018-08-11 DIAGNOSIS — M25511 Pain in right shoulder: Secondary | ICD-10-CM | POA: Diagnosis not present

## 2018-08-11 DIAGNOSIS — M25512 Pain in left shoulder: Secondary | ICD-10-CM | POA: Diagnosis not present

## 2018-08-12 DIAGNOSIS — R200 Anesthesia of skin: Secondary | ICD-10-CM | POA: Diagnosis not present

## 2018-08-16 DIAGNOSIS — M25512 Pain in left shoulder: Secondary | ICD-10-CM | POA: Diagnosis not present

## 2018-08-16 DIAGNOSIS — M25511 Pain in right shoulder: Secondary | ICD-10-CM | POA: Diagnosis not present

## 2018-08-19 DIAGNOSIS — M25512 Pain in left shoulder: Secondary | ICD-10-CM | POA: Diagnosis not present

## 2018-08-19 DIAGNOSIS — M25511 Pain in right shoulder: Secondary | ICD-10-CM | POA: Diagnosis not present

## 2018-08-24 DIAGNOSIS — M25511 Pain in right shoulder: Secondary | ICD-10-CM | POA: Diagnosis not present

## 2018-08-24 DIAGNOSIS — M25512 Pain in left shoulder: Secondary | ICD-10-CM | POA: Diagnosis not present

## 2018-09-02 ENCOUNTER — Other Ambulatory Visit: Payer: Self-pay | Admitting: Neurosurgery

## 2018-09-02 DIAGNOSIS — M4317 Spondylolisthesis, lumbosacral region: Secondary | ICD-10-CM

## 2018-09-23 ENCOUNTER — Other Ambulatory Visit: Payer: Self-pay

## 2018-09-23 ENCOUNTER — Ambulatory Visit
Admission: RE | Admit: 2018-09-23 | Discharge: 2018-09-23 | Disposition: A | Discharge status: Home / Self Care | Payer: 59 | Source: Ambulatory Visit | Attending: Neurosurgery | Admitting: Neurosurgery

## 2018-09-23 DIAGNOSIS — M4317 Spondylolisthesis, lumbosacral region: Secondary | ICD-10-CM

## 2018-09-23 MED ORDER — GADOBENATE DIMEGLUMINE 529 MG/ML IV SOLN
20.0000 mL | Freq: Once | INTRAVENOUS | Status: AC | PRN
  Administered 2018-09-23: 20 mL via INTRAVENOUS

## 2018-11-16 DIAGNOSIS — C4492 Squamous cell carcinoma of skin, unspecified: Secondary | ICD-10-CM

## 2018-11-16 HISTORY — DX: Squamous cell carcinoma of skin, unspecified: C44.92

## 2019-03-17 ENCOUNTER — Other Ambulatory Visit: Payer: Self-pay | Admitting: Internal Medicine

## 2019-03-17 DIAGNOSIS — M5410 Radiculopathy, site unspecified: Secondary | ICD-10-CM

## 2019-04-09 ENCOUNTER — Other Ambulatory Visit: Payer: 59

## 2019-04-17 ENCOUNTER — Other Ambulatory Visit: Payer: Self-pay

## 2019-04-17 ENCOUNTER — Ambulatory Visit
Admission: RE | Admit: 2019-04-17 | Discharge: 2019-04-17 | Disposition: A | Discharge status: Home / Self Care | Payer: 59 | Source: Ambulatory Visit | Attending: Internal Medicine | Admitting: Internal Medicine

## 2019-04-17 DIAGNOSIS — M5410 Radiculopathy, site unspecified: Secondary | ICD-10-CM

## 2019-06-15 HISTORY — PX: CATARACT EXTRACTION W/ INTRAOCULAR LENS IMPLANT: SHX1309

## 2019-06-27 ENCOUNTER — Ambulatory Visit: Payer: 59 | Admitting: Dermatology

## 2019-07-06 HISTORY — PX: CATARACT EXTRACTION W/ INTRAOCULAR LENS IMPLANT: SHX1309

## 2019-08-22 ENCOUNTER — Ambulatory Visit: Payer: 59 | Admitting: Dermatology

## 2019-08-22 ENCOUNTER — Other Ambulatory Visit: Payer: Self-pay

## 2019-08-22 DIAGNOSIS — C44321 Squamous cell carcinoma of skin of nose: Secondary | ICD-10-CM

## 2019-08-22 DIAGNOSIS — C4492 Squamous cell carcinoma of skin, unspecified: Secondary | ICD-10-CM

## 2019-08-22 NOTE — Patient Instructions (Signed)
Recommend daily broad spectrum sunscreen SPF 30+ to sun-exposed areas, reapply every 2 hours as needed. Call for new or changing lesions.  

## 2019-08-22 NOTE — Progress Notes (Signed)
   Follow-Up Visit   Subjective  Rodney Clark is a 64 y.o. male who presents for the following: Follow-up.  Patient here today for SCC follow up of the left nose. Area was originally treated with LN2.Patient uses 5FU/calcipotriene mix BID x 7 days, discontinued for 3 weeks then restarted BID for another 7 days. Patient advises the area did get red with some flaking > with 1st treatement.   The following portions of the chart were reviewed this encounter and updated as appropriate:  Tobacco  Allergies  Meds  Problems  Med Hx  Surg Hx  Fam Hx     Review of Systems:  No other skin or systemic complaints except as noted in HPI or Assessment and Plan.  Objective  Well appearing patient in no apparent distress; mood and affect are within normal limits.  A focused examination was performed including face. Relevant physical exam findings are noted in the Assessment and Plan.  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  Wait 3 weeks then do another round. May treat for another 7 days but make sure to leave 2-4 weeks in between treatments.  Return in about 3 months (around 11/22/2019) for recheck SCC nose.  Graciella Belton, RMA, am acting as scribe for [name], MD . Documentation: I have reviewed the above documentation for accuracy and completeness, and I agree with the above.  [name], MD

## 2019-08-23 ENCOUNTER — Encounter: Payer: Self-pay | Admitting: Dermatology

## 2019-11-30 ENCOUNTER — Ambulatory Visit: Payer: 59 | Admitting: Dermatology

## 2019-11-30 ENCOUNTER — Other Ambulatory Visit: Payer: Self-pay

## 2019-11-30 DIAGNOSIS — L814 Other melanin hyperpigmentation: Secondary | ICD-10-CM

## 2019-11-30 DIAGNOSIS — L570 Actinic keratosis: Secondary | ICD-10-CM

## 2019-11-30 DIAGNOSIS — Z85828 Personal history of other malignant neoplasm of skin: Secondary | ICD-10-CM

## 2019-11-30 DIAGNOSIS — L820 Inflamed seborrheic keratosis: Secondary | ICD-10-CM | POA: Diagnosis not present

## 2019-11-30 DIAGNOSIS — L578 Other skin changes due to chronic exposure to nonionizing radiation: Secondary | ICD-10-CM | POA: Diagnosis not present

## 2019-11-30 DIAGNOSIS — Z1283 Encounter for screening for malignant neoplasm of skin: Secondary | ICD-10-CM

## 2019-11-30 DIAGNOSIS — L821 Other seborrheic keratosis: Secondary | ICD-10-CM | POA: Diagnosis not present

## 2019-11-30 DIAGNOSIS — L918 Other hypertrophic disorders of the skin: Secondary | ICD-10-CM

## 2019-11-30 NOTE — Progress Notes (Signed)
Follow-Up Visit   Subjective  ICKER Clark is a 64 y.o. male who presents for the following: Hx of SCC bx proven 11/16/2018 (L nasal supra tip 38m f/u, 5FU/Calcipotriene bid x 7 days for 3 rounds since last visit, 1 wk on 3 wks off x 3). The patient presents for Upper Body Skin Exam (UBSE) for skin cancer screening and mole check.  The following portions of the chart were reviewed this encounter and updated as appropriate:  Tobacco  Allergies  Meds  Problems  Med Hx  Surg Hx  Fam Hx     Review of Systems:  No other skin or systemic complaints except as noted in HPI or Assessment and Plan.  Objective  Well appearing patient in no apparent distress; mood and affect are within normal limits.  All skin waist up examined.  Objective  L nasal supratip: Clear today  Objective  face x 10 (10): Pink scaly macules   Objective  R shoulder x 1, L lat canthus x 1 (2): Erythematous keratotic or waxy stuck-on papule or plaque.    Assessment & Plan    Actinic Damage - diffuse scaly erythematous macules with underlying dyspigmentation - Recommend daily broad spectrum sunscreen SPF 30+ to sun-exposed areas, reapply every 2 hours as needed.  - Call for new or changing lesions.  Seborrheic Keratoses - Stuck-on, waxy, tan-brown papules and plaques  - Discussed benign etiology and prognosis. - Observe - Call for any changes  Skin cancer screening performed today.  Lentigines - Scattered tan macules - Discussed due to sun exposure - Benign, observe - Call for any changes  Acrochordons (Skin Tags) - Fleshy, skin-colored pedunculated papules - Benign appearing.  - Observe. - If desired, they can be removed with an in office procedure that is not covered by insurance. - Please call the clinic if you notice any new or changing lesions.   History of SCC (squamous cell carcinoma) of skin L nasal supratip Clear with Ln2 in past and  ~6 wks total of 53fu/Calcipotriene  treatment.  Observe for recurrence. Call clinic for new or changing lesions.  Recommend regular skin exams, daily broad-spectrum spf 30+ sunscreen use, and photoprotection.     AK (actinic keratosis) (10) face x 10  Destruction of lesion - face x 10 Complexity: simple   Destruction method: cryotherapy   Informed consent: discussed and consent obtained   Timeout:  patient name, date of birth, surgical site, and procedure verified Lesion destroyed using liquid nitrogen: Yes   Region frozen until ice ball extended beyond lesion: Yes   Outcome: patient tolerated procedure well with no complications   Post-procedure details: wound care instructions given    Inflamed seborrheic keratosis (2) R shoulder x 1, L lat canthus x 1  Destruction of lesion - R shoulder x 1, L lat canthus x 1 Complexity: simple   Destruction method: cryotherapy   Informed consent: discussed and consent obtained   Timeout:  patient name, date of birth, surgical site, and procedure verified Lesion destroyed using liquid nitrogen: Yes   Region frozen until ice ball extended beyond lesion: Yes   Outcome: patient tolerated procedure well with no complications   Post-procedure details: wound care instructions given    Return in about 6 months (around 05/29/2020) for TBSE, hx SCC, AK.  I, Sonya Hupman, RMA, am acting as scribe for Rodney Ser, MD .  Documentation: I have reviewed the above documentation for accuracy and completeness, and I agree with the above.  Rodney Ser, MD

## 2019-11-30 NOTE — Patient Instructions (Signed)
Cryotherapy Aftercare  . Wash gently with soap and water everyday.   . Apply Vaseline and Band-Aid daily until healed.  

## 2019-12-05 ENCOUNTER — Encounter: Payer: Self-pay | Admitting: Dermatology

## 2020-02-10 ENCOUNTER — Ambulatory Visit (LOCAL_COMMUNITY_HEALTH_CENTER): Payer: Self-pay

## 2020-02-10 ENCOUNTER — Other Ambulatory Visit: Payer: Self-pay

## 2020-02-10 DIAGNOSIS — Z23 Encounter for immunization: Secondary | ICD-10-CM

## 2020-02-27 ENCOUNTER — Ambulatory Visit: Payer: Medicare Other

## 2020-03-29 IMAGING — XA DG MYELOGRAPHY LUMBAR INJ LUMBOSACRAL
7 of 18 series · 7 of 18 positions shown · non-contrast
Comparison: None

CLINICAL DATA: Low back pain.  BILATERAL leg pain.
TECHNIQUE: Contiguous axial images were obtained through the Lumbar spine after
the intrathecal infusion of infusion. Coronal and sagittal
reconstructions were obtained of the axial image sets.

[Series 1: w lumbar spine ap · 0.15mm/px · 1 of 1 slices shown]
[im 1/1]
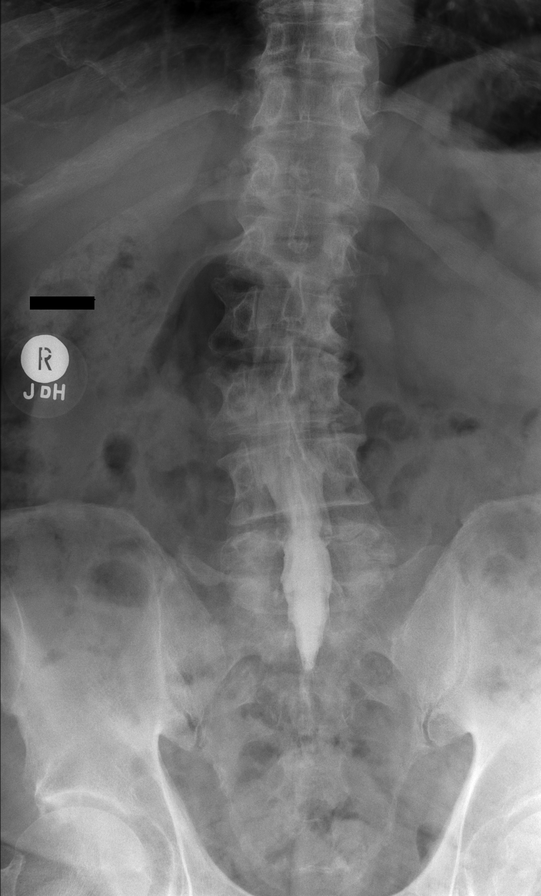

[Series 1: vasc adipose · 1 of 1 slices shown (1 of 4)]
[im 1/1]
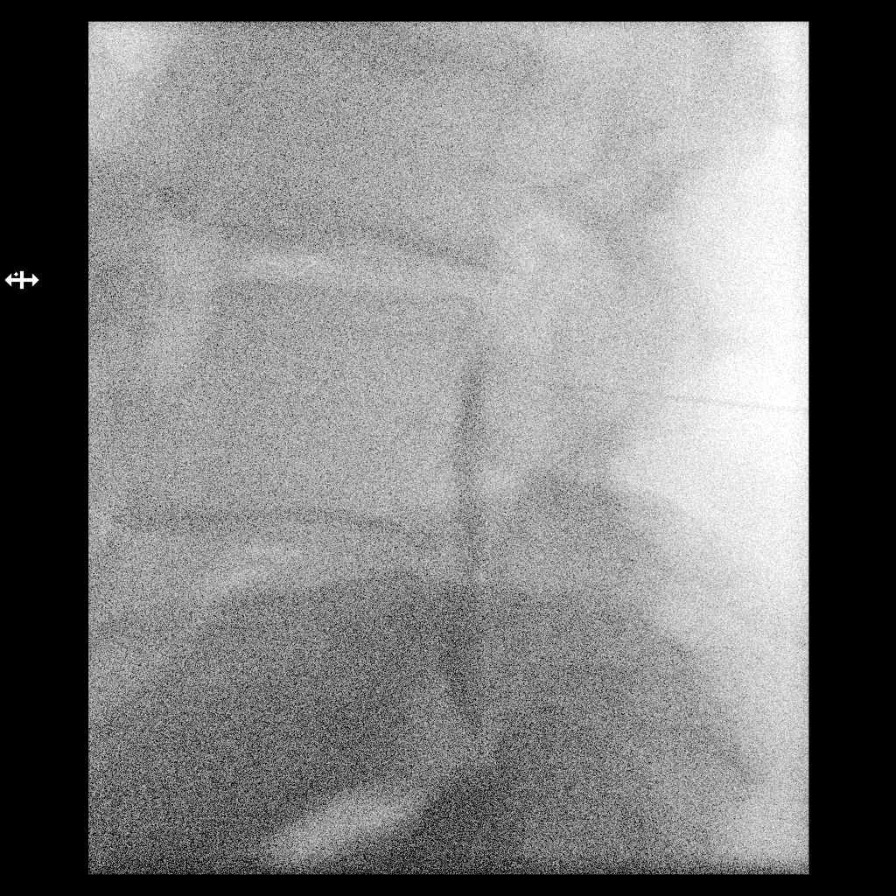

[Series 2: w lumbar spine lat · 0.15mm/px · 1 of 1 slices shown]
[im 1/1]
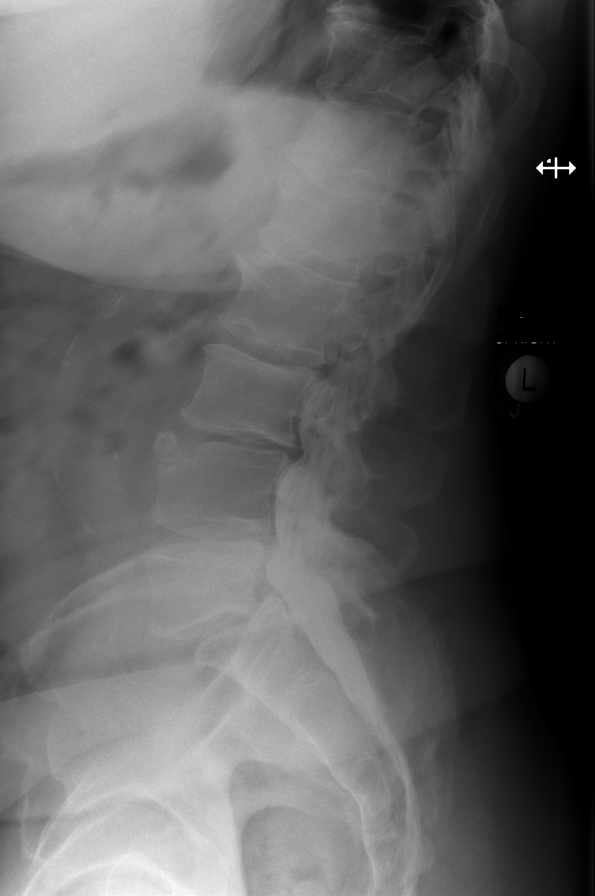

[Series 2: vasc adipose · 1 of 1 slices shown (2 of 4)]
[im 1/1]
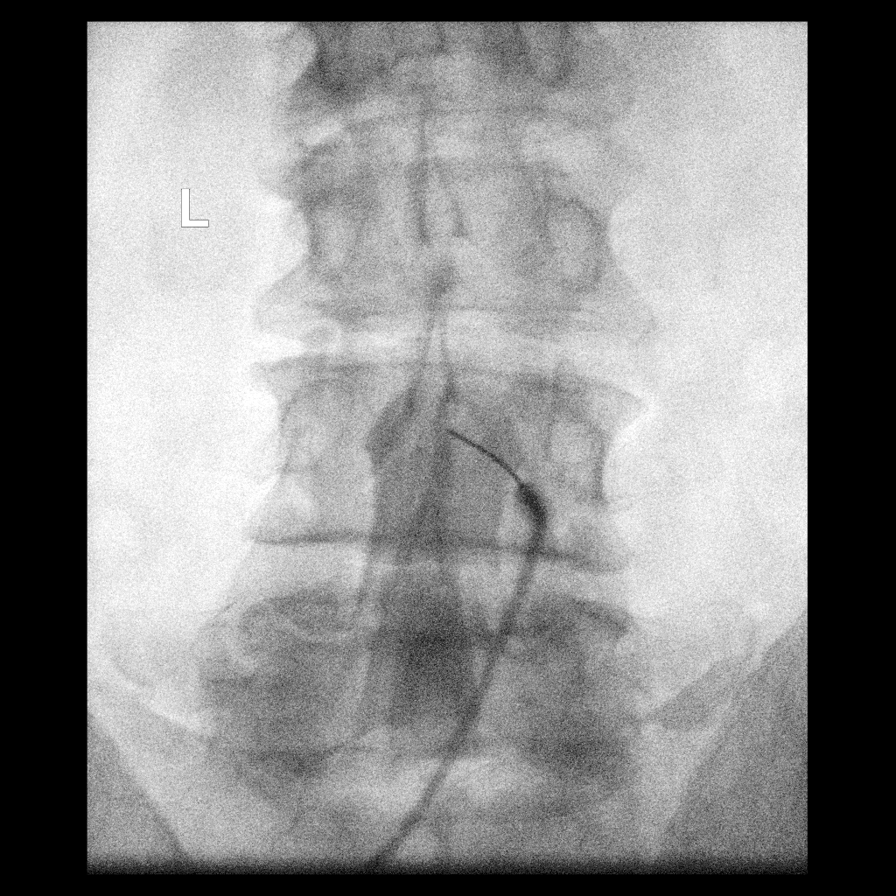

[Series 3: vasc adipose · 1 of 1 slices shown (3 of 4)]
[im 1/1]
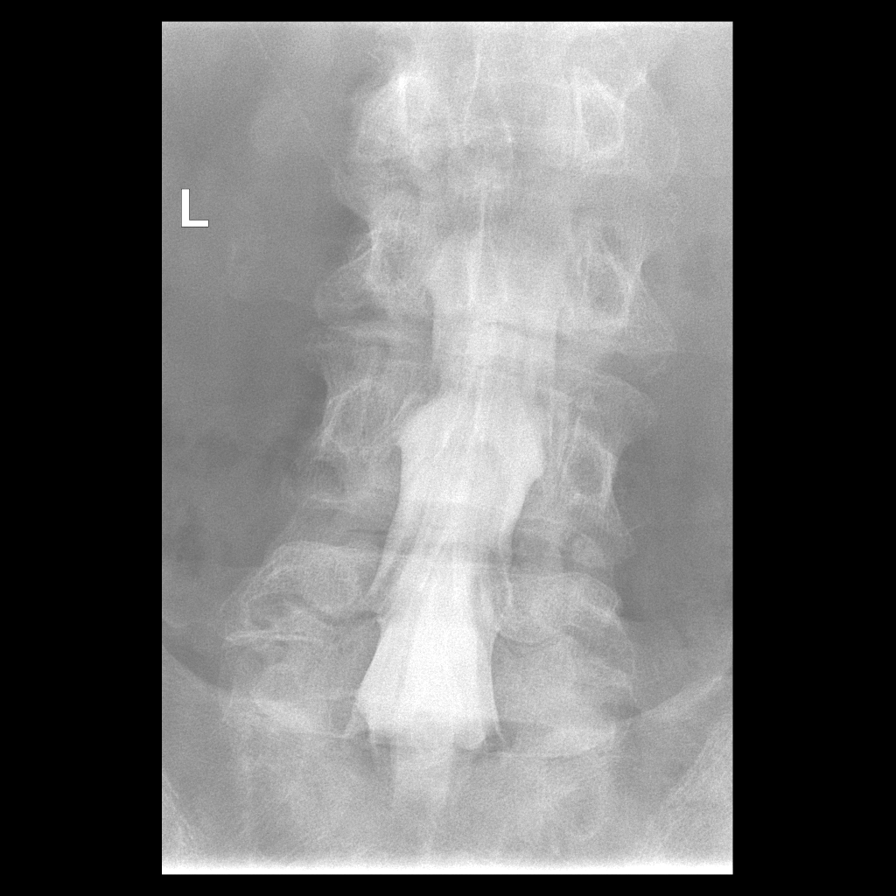

[Series 3: w lumbar spine flexion · 0.15mm/px · 1 of 1 slices shown]
[im 1/1]
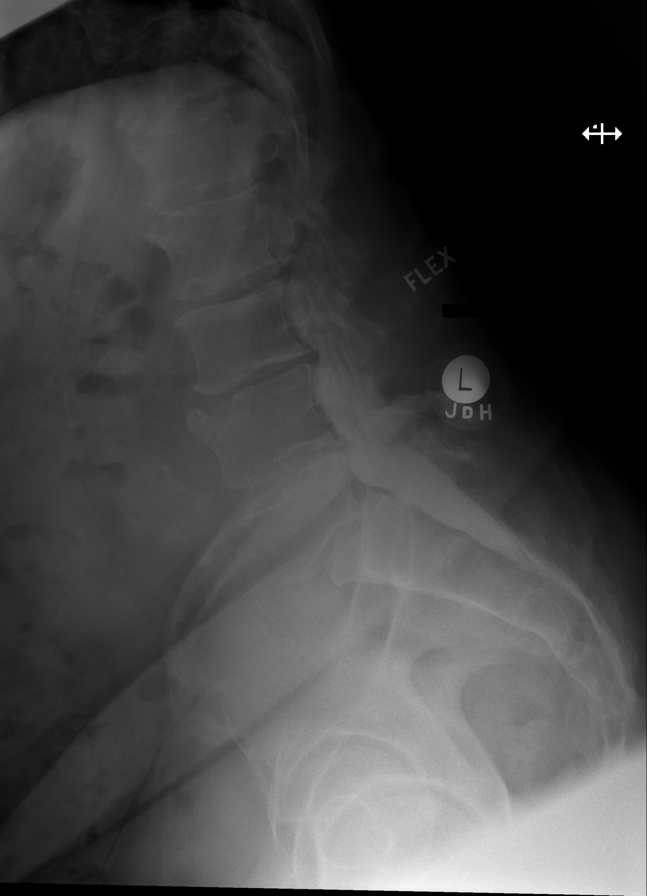

[Series 4: vasc adipose · 1 of 1 slices shown (4 of 4)]
[im 1/1]
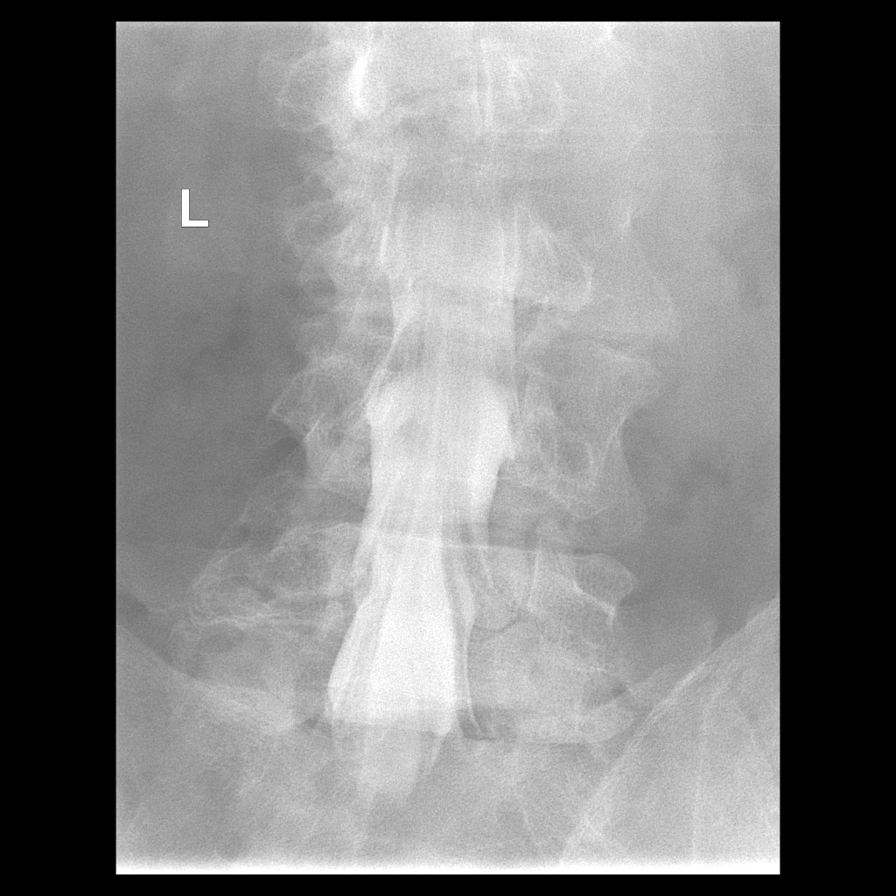

[7 of 18 positions shown; findings below may reference images not displayed]

EXAM:
LUMBAR MYELOGRAM

FLUOROSCOPY TIME:  32 seconds corresponding to a Dose Area Product
of 343.63 Gy*m2

PROCEDURE:
After thorough discussion of risks and benefits of the procedure
including bleeding, infection, injury to nerves, blood vessels,
adjacent structures as well as headache and CSF leak, written and
oral informed consent was obtained. Consent was obtained by Dr. Jan Halvor
Honen. Time out form was completed.

Patient was positioned prone on the fluoroscopy table. Local
anesthesia was provided with 1% lidocaine without epinephrine after
prepped and draped in the usual sterile fashion. Puncture was
performed at L3-4 using a 3 1/2 inch 22-gauge spinal needle via
midline approach. Using a single pass through the dura, the needle
was placed within the thecal sac, with return of clear CSF. 15 mL of
Isovue Y-PYY was injected into the thecal sac, with normal
opacification of the nerve roots and cauda equina consistent with
free flow within the subarachnoid space.

I personally performed the lumbar puncture and administered the
intrathecal contrast. I also personally supervised acquisition of
the myelogram images.
FINDINGS: LUMBAR MYELOGRAM FINDINGS:

Good opacification lumbar subarachnoid space. Mild stenosis L3-4,
effacement of both L4 nerve roots, worse on the LEFT. Disc space
narrowing at multiple levels, with Schmorl's nodes, most pronounced
L3-L4. Mild effacement RIGHT L5 nerve root at the L4-5 level.

BILATERAL L5 spondylolysis. Grade 1 spondylolisthesis L5 on S1 of 9
mm. With patient standing, anterolisthesis at L5-S1 increases to 11
mm in neutral, 13 mm in flexion, and 11 mm in extension.

Asymmetric loss of interspace height at L2-3 and L3-4 on the LEFT,
and L4-5 on the RIGHT contributes to mild degenerative scoliosis
convex RIGHT mid lumbar region more obvious with patient standing.

CT LUMBAR MYELOGRAM FINDINGS:

Segmentation: Normal.

Alignment: 9 mm anterolisthesis L5-S1 with patient recumbent for CT.

Vertebrae: No worrisome osseous lesion.BILATERAL L5 spondylolysis.
Limbus vertebrae at L4. Multiple Schmorl's nodes. Subchondral cyst
formation L2-3 on the LEFT, L4-5 on the RIGHT.

Conus medullaris: Normal in size, signal, and location.

Paraspinal tissues: No evidence for hydronephrosis or paravertebral
mass.

Disc levels:

L1-L2:  Annular bulge.  Facet arthropathy.  No impingement.

L2-L3: Annular bulge. Far-lateral protrusion on the LEFT. LEFT L2,
possibly LEFT L3 nerve root impingement.

L3-L4: Central and leftward protrusion. Mild stenosis compounded by
posterior element hypertrophy. Disc material extends to the foramen.
LEFT L3, LEFT L4 nerve root impingement.

L4-L5: Central and rightward protrusion. Subchondral endplate cyst
formation on the RIGHT. Mild facet arthropathy. No definite L5 nerve
root impingement.

L5-S1: BILATERAL L5 spondylolysis. Grade 1 spondylolisthesis with
vacuum phenomenon. Uncovering of the disc without significant
protrusion. No subarticular zone narrowing. Moderate to severe
foraminal narrowing results in BILATERAL L5 nerve root impingement.
IMPRESSION: LUMBAR MYELOGRAM IMPRESSION:

Dynamic instability at L5-S1. BILATERAL L5 spondylolysis contributes
to anterolisthesis of up to 13 mm with patient standing in flexion.
Mild stenosis at L3-4.

CT LUMBAR MYELOGRAM IMPRESSION:

BILATERAL L5 spondylolysis with grade 1 spondylolisthesis L5-S1.
Severe BILATERAL foraminal narrowing at this level affects both L5
nerve roots.

Mild stenosis L3-4 is multifactorial, related to central and
leftward protrusion as well as posterior element hypertrophy with
LEFT L3 and LEFT L4 nerve root impingement.

## 2020-05-08 ENCOUNTER — Ambulatory Visit (LOCAL_COMMUNITY_HEALTH_CENTER): Payer: Medicare Other

## 2020-05-08 ENCOUNTER — Other Ambulatory Visit: Payer: Self-pay

## 2020-05-08 DIAGNOSIS — Z23 Encounter for immunization: Secondary | ICD-10-CM | POA: Diagnosis not present

## 2020-05-08 NOTE — Progress Notes (Signed)
Prevnar given; tolerated well Aileen Fass, RN

## 2020-05-09 ENCOUNTER — Telehealth: Payer: Self-pay | Admitting: Internal Medicine

## 2020-05-09 NOTE — Telephone Encounter (Signed)
Pt called Primary Care at [facility]- to notify of a random call he received and was asked if he wanted to schedule a vaccine appointment for himself or his loved ones. He answered they've received all vaccines so it's not necessary but he was still asked to give his name  And Social Security number. He researched the number [phone] and [facility] location was what appeared. Pt is notifying [facility] of this spam call he received to prevent this from reoccurring. Please advise and thank you

## 2020-05-17 ENCOUNTER — Other Ambulatory Visit: Payer: Self-pay | Admitting: Student

## 2020-05-17 DIAGNOSIS — M5416 Radiculopathy, lumbar region: Secondary | ICD-10-CM

## 2020-06-04 ENCOUNTER — Other Ambulatory Visit: Payer: Self-pay

## 2020-06-04 ENCOUNTER — Ambulatory Visit (INDEPENDENT_AMBULATORY_CARE_PROVIDER_SITE_OTHER): Payer: Medicare Other | Admitting: Dermatology

## 2020-06-04 DIAGNOSIS — Z85828 Personal history of other malignant neoplasm of skin: Secondary | ICD-10-CM

## 2020-06-04 DIAGNOSIS — L578 Other skin changes due to chronic exposure to nonionizing radiation: Secondary | ICD-10-CM

## 2020-06-04 DIAGNOSIS — Z1283 Encounter for screening for malignant neoplasm of skin: Secondary | ICD-10-CM | POA: Diagnosis not present

## 2020-06-04 DIAGNOSIS — L821 Other seborrheic keratosis: Secondary | ICD-10-CM

## 2020-06-04 DIAGNOSIS — D1800 Hemangioma unspecified site: Secondary | ICD-10-CM

## 2020-06-04 DIAGNOSIS — L570 Actinic keratosis: Secondary | ICD-10-CM

## 2020-06-04 DIAGNOSIS — L814 Other melanin hyperpigmentation: Secondary | ICD-10-CM | POA: Diagnosis not present

## 2020-06-04 DIAGNOSIS — D229 Melanocytic nevi, unspecified: Secondary | ICD-10-CM

## 2020-06-04 NOTE — Patient Instructions (Addendum)
Cryotherapy Aftercare  . Wash gently with soap and water everyday.   . Apply Vaseline and Band-Aid daily until healed.  

## 2020-06-04 NOTE — Progress Notes (Signed)
° °  Follow-Up Visit   Subjective  Rodney Clark is a 65 y.o. male who presents for the following: 6 month tbse (Patient reports a scaly spot on left cheek. Patient said that he is having follow up on nose and seems to be doing fine. Patient has history of ). Patient here for full body skin exam and skin cancer screening.  The following portions of the chart were reviewed this encounter and updated as appropriate:  Tobacco   Allergies   Meds   Problems   Med Hx   Surg Hx   Fam Hx       Objective  Well appearing patient in no apparent distress; mood and affect are within normal limits.  A full examination was performed including scalp, head, eyes, ears, nose, lips, neck, chest, axillae, abdomen, back, buttocks, bilateral upper extremities, bilateral lower extremities, hands, feet, fingers, toes, fingernails, and toenails. All findings within normal limits unless otherwise noted below.  Objective  face x 7 (7): Erythematous thin papules/macules with gritty scale.   Assessment & Plan  AK (actinic keratosis) (7) face x 7 Prior to procedure, discussed risks of blister formation, small wound, skin dyspigmentation, or rare scar following cryotherapy.   Destruction of lesion - face x 7 Complexity: simple   Destruction method: cryotherapy   Informed consent: discussed and consent obtained   Timeout:  patient name, date of birth, surgical site, and procedure verified Lesion destroyed using liquid nitrogen: Yes   Region frozen until ice ball extended beyond lesion: Yes   Cryotherapy cycles:  2 Outcome: patient tolerated procedure well with no complications   Post-procedure details: wound care instructions given    Skin cancer screening   Lentigines - Scattered tan macules - Due to sun exposure - Benign-appering, observe - Recommend daily broad spectrum sunscreen SPF 30+ to sun-exposed areas, reapply every 2 hours as needed. - Call for any changes  Seborrheic Keratoses Back,  posterior legs - Stuck-on, waxy, tan-brown papules and plaques  - Discussed benign etiology and prognosis. - Observe - Call for any changes  Melanocytic Nevi - Tan-brown and/or pink-flesh-colored symmetric macules and papules - Benign appearing on exam today - Observation - Call clinic for new or changing moles - Recommend daily use of broad spectrum spf 30+ sunscreen to sun-exposed areas.   Hemangiomas - Red papules - Discussed benign nature - Observe - Call for any changes  Actinic Damage - Chronic, secondary to cumulative UV/sun exposure - diffuse scaly erythematous macules with underlying dyspigmentation - Recommend daily broad spectrum sunscreen SPF 30+ to sun-exposed areas, reapply every 2 hours as needed.  - Call for new or changing lesions.  History of Squamous Cell Carcinoma of the Skin Left nasal supratip - No evidence of recurrence today - No lymphadenopathy - Recommend regular full body skin exams - Recommend daily broad spectrum sunscreen SPF 30+ to sun-exposed areas, reapply every 2 hours as needed.  - Call if any new or changing lesions are noted between office visits  Skin cancer screening performed today.  Return in about 6 months (around 12/05/2020) for upper body exam .  I, Ruthell Rummage, CMA, am acting as scribe for [name], MD.  Documentation: I have reviewed the above documentation for accuracy and completeness, and I agree with the above.  [name], MD

## 2020-06-05 ENCOUNTER — Encounter: Payer: Self-pay | Admitting: Dermatology

## 2020-06-10 ENCOUNTER — Ambulatory Visit
Admission: RE | Admit: 2020-06-10 | Discharge: 2020-06-10 | Disposition: A | Discharge status: Home / Self Care | Payer: Medicare Other | Source: Ambulatory Visit | Attending: Student | Admitting: Student

## 2020-06-10 ENCOUNTER — Other Ambulatory Visit: Payer: Self-pay

## 2020-06-10 DIAGNOSIS — M5416 Radiculopathy, lumbar region: Secondary | ICD-10-CM

## 2020-06-10 MED ORDER — GADOBENATE DIMEGLUMINE 529 MG/ML IV SOLN
20.0000 mL | Freq: Once | INTRAVENOUS | Status: AC | PRN
  Administered 2020-06-10: 20 mL via INTRAVENOUS

## 2020-08-03 IMAGING — RF DG LUMBAR SPINE 2-3V
1 series · 2 of 2 positions shown · non-contrast
Comparison: None.

FLUOROSCOPY TIME:  35 seconds

CLINICAL DATA: L5-S1 PLIF

EXAM:
DG C-ARM 61-120 MIN; LUMBAR SPINE - 2-3 VIEW

[Series 1: run · 2 of 2 slices shown]
[im 1/2]
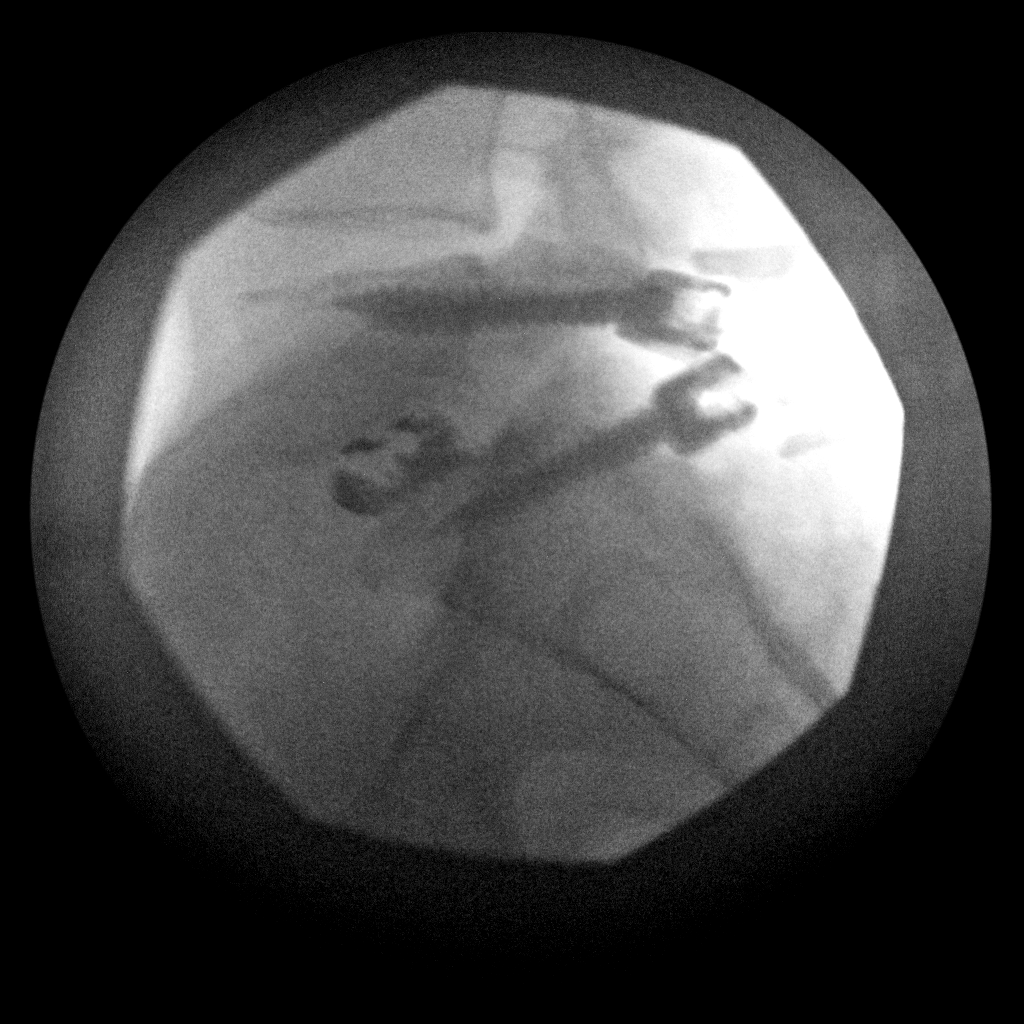
[im 2/2]
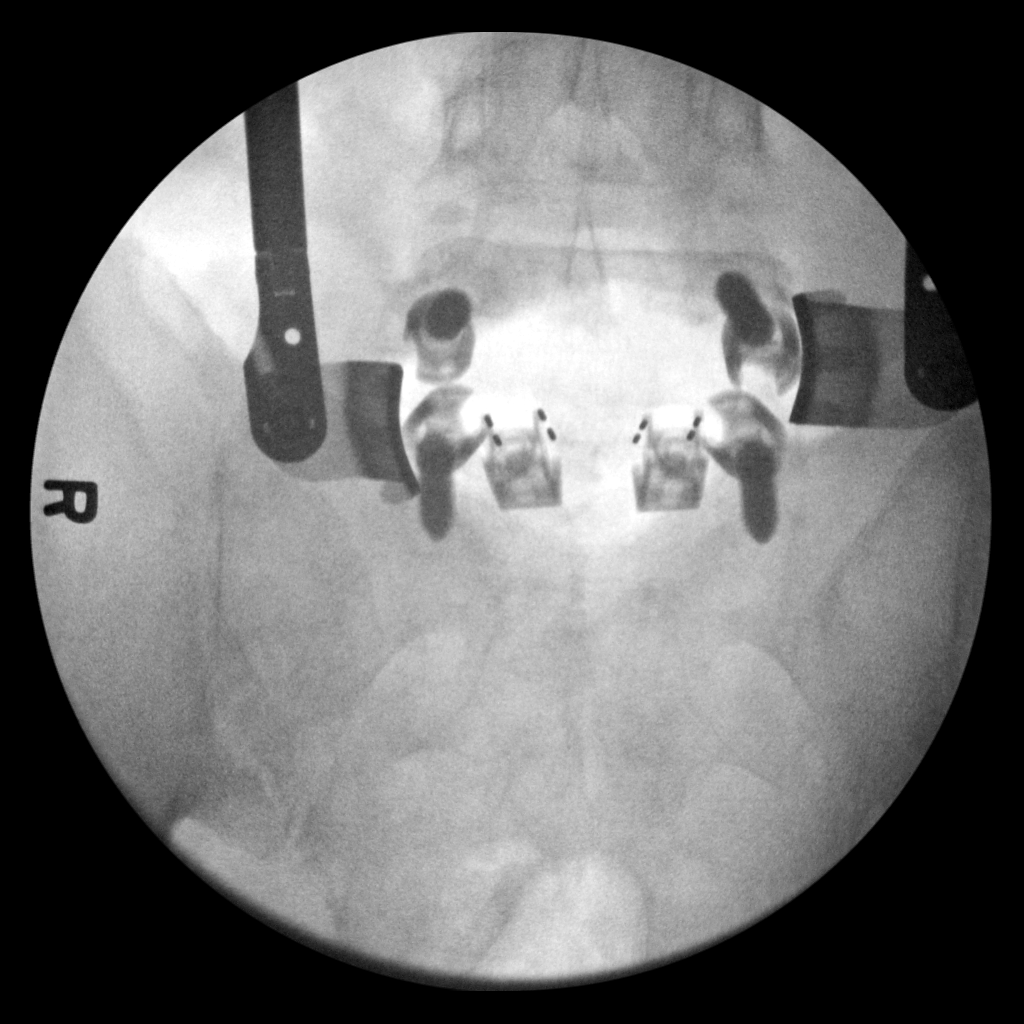

[2 of 2 positions shown; findings below may reference images not displayed]

FINDINGS: Posterior lumbar interbody fusion at L5-S1. Grade 1 anterolisthesis
of L5 on S1.
IMPRESSION: Interval posterior lumbar interbody fusion at L5-S1.

## 2020-11-26 ENCOUNTER — Telehealth: Payer: Self-pay | Admitting: Internal Medicine

## 2020-11-26 NOTE — Telephone Encounter (Signed)
Hi Dr. Henrene Pastor, we have received this patient's GI records. He stated that he is due for a repeat colon and would like to become your patient. Records will be sent to you for review. Please advise on scheduling. Thank you.

## 2020-12-05 NOTE — Telephone Encounter (Signed)
GI RECORDS REVIEW  I was asked to review these records for the purposes of surveillance colonoscopy.  The patient wishes to schedule with me.  Summary from the available records is as follows  1.  Pathology from July 2011 shows adenomatous colon polyp  2.  Complete colonoscopy with Dr. Vira Agar May 14, 2015 revealed sigmoid diverticulosis and internal hemorrhoids.  Otherwise normal.  Follow-up surveillance examination in 5 years recommended  Based on these records, the patient is appropriate for routine surveillance colonoscopy in the [place].  This can be scheduled directly with me.  Thank you  Docia Chuck. Geri [place]in[place]., M.D. [facility] Division of Gastroenterology

## 2020-12-06 ENCOUNTER — Encounter: Payer: Self-pay | Admitting: Internal Medicine

## 2020-12-06 NOTE — Telephone Encounter (Signed)
Colon scheduled on 02/13/21 at [facility].

## 2020-12-19 ENCOUNTER — Ambulatory Visit: Payer: Medicare Other | Admitting: Dermatology

## 2020-12-25 ENCOUNTER — Ambulatory Visit: Payer: PPO | Admitting: Dermatology

## 2020-12-25 ENCOUNTER — Other Ambulatory Visit: Payer: Self-pay

## 2020-12-25 DIAGNOSIS — L570 Actinic keratosis: Secondary | ICD-10-CM | POA: Diagnosis not present

## 2020-12-25 DIAGNOSIS — L821 Other seborrheic keratosis: Secondary | ICD-10-CM

## 2020-12-25 DIAGNOSIS — D1800 Hemangioma unspecified site: Secondary | ICD-10-CM | POA: Diagnosis not present

## 2020-12-25 DIAGNOSIS — L578 Other skin changes due to chronic exposure to nonionizing radiation: Secondary | ICD-10-CM

## 2020-12-25 DIAGNOSIS — L820 Inflamed seborrheic keratosis: Secondary | ICD-10-CM

## 2020-12-25 DIAGNOSIS — L814 Other melanin hyperpigmentation: Secondary | ICD-10-CM | POA: Diagnosis not present

## 2020-12-25 DIAGNOSIS — D229 Melanocytic nevi, unspecified: Secondary | ICD-10-CM | POA: Diagnosis not present

## 2020-12-25 DIAGNOSIS — Z85828 Personal history of other malignant neoplasm of skin: Secondary | ICD-10-CM

## 2020-12-25 DIAGNOSIS — Z1283 Encounter for screening for malignant neoplasm of skin: Secondary | ICD-10-CM

## 2020-12-25 MED ORDER — FLUOROURACIL 5 % EX CREA
TOPICAL_CREAM | CUTANEOUS | 0 refills | Status: DC
Start: 2020-12-25 — End: 2021-09-19

## 2020-12-25 NOTE — Patient Instructions (Signed)

## 2020-12-25 NOTE — Progress Notes (Signed)
Follow-Up Visit   Subjective  Rodney Clark is a 65 y.o. male who presents for the following:  UBSE  (Hx of AK's and SCC ). The patient presents for Upper Body Skin Exam (UBSE) for skin cancer screening and mole check.  The following portions of the chart were reviewed this encounter and updated as appropriate:   Tobacco  Allergies  Meds  Problems  Med Hx  Surg Hx  Fam Hx     Review of Systems:  No other skin or systemic complaints except as noted in HPI or Assessment and Plan.  Objective  Well appearing patient in no apparent distress; mood and affect are within normal limits.  All skin waist up examined.  Scalp x 1, hand x 1 (2) Erythematous keratotic or waxy stuck-on papule or plaque.   Nose and temples x 8 (8) Erythematous thin papules/macules with gritty scale.   Assessment & Plan  Inflamed seborrheic keratosis Scalp x 1, hand x 1  Destruction of lesion - Scalp x 1, hand x 1 Complexity: simple   Destruction method: cryotherapy   Informed consent: discussed and consent obtained   Timeout:  patient name, date of birth, surgical site, and procedure verified Lesion destroyed using liquid nitrogen: Yes   Region frozen until ice ball extended beyond lesion: Yes   Outcome: patient tolerated procedure well with no complications   Post-procedure details: wound care instructions given    AK (actinic keratosis) (8) Nose and temples x 8  Destruction of lesion - Nose and temples x 8 Complexity: simple   Destruction method: cryotherapy   Informed consent: discussed and consent obtained   Timeout:  patient name, date of birth, surgical site, and procedure verified Lesion destroyed using liquid nitrogen: Yes   Region frozen until ice ball extended beyond lesion: Yes   Outcome: patient tolerated procedure well with no complications   Post-procedure details: wound care instructions given    Actinic Damage - Severe, confluent actinic changes with pre-cancerous actinic  keratoses  - Severe, chronic, not at goal, secondary to cumulative UV radiation exposure over time - diffuse scaly erythematous macules and papules with underlying dyspigmentation - Discussed Prescription "Field Treatment" for Severe, Chronic Confluent Actinic Changes with Pre-Cancerous Actinic Keratoses Field treatment involves treatment of an entire area of skin that has confluent Actinic Changes (Sun/ Ultraviolet light damage) and PreCancerous Actinic Keratoses by method of PhotoDynamic Therapy (PDT) and/or prescription Topical Chemotherapy agents such as 5-fluorouracil, 5-fluorouracil/calcipotriene, and/or imiquimod.  The purpose is to decrease the number of clinically evident and subclinical PreCancerous lesions to prevent progression to development of skin cancer by chemically destroying early precancer changes that may or may not be visible.  It has been shown to reduce the risk of developing skin cancer in the treated area. As a result of treatment, redness, scaling, crusting, and open sores may occur during treatment course. One or more than one of these methods may be used and may have to be used several times to control, suppress and eliminate the PreCancerous changes. Discussed treatment course, expected reaction, and possible side effects. - Recommend daily broad spectrum sunscreen SPF 30+ to sun-exposed areas, reapply every 2 hours as needed.  - Staying in the shade or wearing long sleeves, sun glasses (UVA+UVB protection) and wide brim hats (4-inch brim around the entire circumference of the hat) are also recommended. - Call for new or changing lesions.  fluorouracil (EFUDEX) 5 % cream - Nose and temples x 8 Apply to the temples  and nose BID x 7 days.   Lentigines - Scattered tan macules - Due to sun exposure - Benign-appearing, observe - Recommend daily broad spectrum sunscreen SPF 30+ to sun-exposed areas, reapply every 2 hours as needed. - Call for any changes  Seborrheic  Keratoses - Stuck-on, waxy, tan-brown papules and/or plaques  - Benign-appearing - Discussed benign etiology and prognosis. - Observe - Call for any changes  Melanocytic Nevi - Tan-brown and/or pink-flesh-colored symmetric macules and papules - Benign appearing on exam today - Observation - Call clinic for new or changing moles - Recommend daily use of broad spectrum spf 30+ sunscreen to sun-exposed areas.   Hemangiomas - Red papules - Discussed benign nature - Observe - Call for any changes  History of Squamous Cell Carcinoma of the Skin - No evidence of recurrence today - No lymphadenopathy - Recommend regular full body skin exams - Recommend daily broad spectrum sunscreen SPF 30+ to sun-exposed areas, reapply every 2 hours as needed.  - Call if any new or changing lesions are noted between office visits  Skin cancer screening performed today.  Return in about 6 months (around 06/24/2021).  Luther Redo, CMA, am acting as scribe for [name], MD . Documentation: I have reviewed the above documentation for accuracy and completeness, and I agree with the above.  [name], MD

## 2020-12-27 ENCOUNTER — Encounter: Payer: Self-pay | Admitting: Dermatology

## 2021-01-30 ENCOUNTER — Ambulatory Visit (AMBULATORY_SURGERY_CENTER): Payer: Self-pay | Admitting: *Deleted

## 2021-01-30 ENCOUNTER — Encounter: Payer: Self-pay | Admitting: Internal Medicine

## 2021-01-30 ENCOUNTER — Other Ambulatory Visit: Payer: Self-pay

## 2021-01-30 VITALS — Ht 70.0 in | Wt 278.2 lb

## 2021-01-30 DIAGNOSIS — Z86010 Personal history of colonic polyps: Secondary | ICD-10-CM

## 2021-01-30 MED ORDER — SUTAB 1479-225-188 MG PO TABS: 1.0000 | ORAL_TABLET | Freq: Once | ORAL | 0 refills | Status: AC

## 2021-01-30 NOTE — Progress Notes (Signed)
  No trouble with anesthesia, denies being told they were difficult to intubate, or hx/fam hx of malignant hyperthermia per pt    No egg or soy allergy  No home oxygen use   No medications for weight loss taken  emmi information given  Pt denies constipation issues  Pt informed that we do not do prior authorizations for prep  

## 2021-02-01 DIAGNOSIS — M4802 Spinal stenosis, cervical region: Secondary | ICD-10-CM | POA: Diagnosis not present

## 2021-02-01 DIAGNOSIS — M5412 Radiculopathy, cervical region: Secondary | ICD-10-CM | POA: Diagnosis not present

## 2021-02-08 ENCOUNTER — Telehealth: Payer: Self-pay | Admitting: Internal Medicine

## 2021-02-08 NOTE — Telephone Encounter (Signed)
Pt called- very frustrated about the phone system in our practice, to the point he was considering cancelling his colon as he has tried multiple times to contact us and has not been able to get through and also LM that was never returned. I did apologize for his issues and inconvenience with this matter - assured him it is and has been addressed -   Calling about hte [employer] of his Sutab-He states he did not receive a Sutab coupon at  his In Person PV 11-2 -  he does have instructions but the tablets are 100$ and is states this is too expensive and is asking for an alternative  I called [facility] and gave them a Medicare Sutab Coupon over the phone- [place] dropped to $40- pt is okay with a $40 co pay

## 2021-02-08 NOTE — Telephone Encounter (Signed)
Inbound call from pt requesting a call back stating that he has a question regarding his prep. Please advise. Thank you.

## 2021-02-13 ENCOUNTER — Ambulatory Visit (AMBULATORY_SURGERY_CENTER): Payer: PPO | Admitting: Internal Medicine

## 2021-02-13 ENCOUNTER — Encounter: Payer: Self-pay | Admitting: Internal Medicine

## 2021-02-13 ENCOUNTER — Other Ambulatory Visit: Payer: Self-pay

## 2021-02-13 VITALS — BP 129/76 | HR 64 | Temp 97.3°F | Resp 13 | Ht 70.0 in | Wt 278.0 lb

## 2021-02-13 DIAGNOSIS — G4730 Sleep apnea, unspecified: Secondary | ICD-10-CM | POA: Diagnosis not present

## 2021-02-13 DIAGNOSIS — Z86010 Personal history of colonic polyps: Secondary | ICD-10-CM | POA: Diagnosis not present

## 2021-02-13 DIAGNOSIS — E785 Hyperlipidemia, unspecified: Secondary | ICD-10-CM | POA: Diagnosis not present

## 2021-02-13 DIAGNOSIS — I10 Essential (primary) hypertension: Secondary | ICD-10-CM | POA: Diagnosis not present

## 2021-02-13 MED ORDER — SODIUM CHLORIDE 0.9 % IV SOLN: 500.0000 mL | Freq: Once | INTRAVENOUS | Status: DC

## 2021-02-13 NOTE — Progress Notes (Signed)
HISTORY OF PRESENT ILLNESS:  Rodney Clark is a 65 y.o. male who presents today for surveillance colonoscopy.  No active complaints.  I was asked to review his records in August 2022.  Summary as below: GI RECORDS REVIEW  I was asked to review these records for the purposes of surveillance colonoscopy.  The patient wishes to schedule with me.  Summary from the available records is as follows  1.  Pathology from July 2011 shows adenomatous colon polyp  2.  Complete colonoscopy with Dr. Vira Clark May 14, 2015 revealed sigmoid diverticulosis and internal hemorrhoids.  Otherwise normal.  Follow-up surveillance examination in 5 years recommended  Based on these records, the patient is appropriate for routine surveillance colonoscopy in the [place].  This can be scheduled directly with me.  Thank you  Docia Chuck. Geri Seminole., M.D. [facility] Division of Gastroenterology   REVIEW OF SYSTEMS:  All non-GI ROS negative.  Past Medical History:  Diagnosis Date   Actinic keratosis    Allergic state    Allergy    Arthritis    Atypical chest pain    GERD (gastroesophageal reflux disease)    Gout    Hyperglycemia    Hyperlipidemia    Hypertension    Neuromuscular disorder (HCC)    nerve damage right leg   Numbness of toes    Sleep apnea    wears CPAP   Squamous cell carcinoma of skin 11/16/2018   left nasal supratip (Dr. Evorn Gong). Tx: LN2, 5FU/Calcipotriene cream    Past Surgical History:  Procedure Laterality Date   APPENDECTOMY     BACK SURGERY  01/05/2018   CATARACT EXTRACTION Bilateral    COLONOSCOPY N/A 05/14/2015   Procedure: COLONOSCOPY;  Surgeon: Rodney Silvas, MD;  [facility] ENDOSCOPY;  Service: Endoscopy;  Laterality: N/A;   COLONOSCOPY     HERNIA REPAIR     x2   ROTATOR CUFF REPAIR Right    TONSILLECTOMY      Social History Rodney Clark  reports that he has never smoked. He has never used smokeless tobacco. He reports current alcohol use of about  3.0 standard drinks per week. He reports that he does not use drugs.  family history is not on file.  Allergies  Allergen Reactions   Crestor [Rosuvastatin] Other (See Comments)    Joint pain   Lyrica [Pregabalin]     "Hung around too long and made me feel funny"       PHYSICAL EXAMINATION:  Vital signs: BP (!) 144/66   Pulse (!) 52   Temp (!) 97.3 F (36.3 C) (Temporal)   Ht 5\' 10"  (1.778 m)   Wt 278 lb (126.1 kg)   SpO2 97%   BMI 39.89 kg/m  General: Well-developed, well-nourished, no acute distress HEENT: Sclerae are anicteric, conjunctiva pink. Oral mucosa intact Lungs: Clear Heart: Regular Abdomen: soft, nontender, nondistended, no obvious ascites, no peritoneal signs, normal bowel sounds. No organomegaly. Extremities: No edema Psychiatric: alert and oriented x3. Cooperative     ASSESSMENT:  1.  History of adenomatous colon polyps.  Due for surveillance based on record review   PLAN:  1.  Surveillance colonoscopy

## 2021-02-13 NOTE — Progress Notes (Signed)
Pt's states no medical or surgical changes since previsit or office visit. 

## 2021-02-13 NOTE — Op Note (Signed)
[name] Procedure Date: 02/13/2021 1:22 PM MRN: [id number] Endoscopist: Docia Chuck. Henrene Pastor , MD Age: 65 Referring MD:  Date of Birth: 05-Jun-1955 Gender: Male Account #: [id number] Procedure:                Colonoscopy Indications:              High risk colon cancer surveillance: Personal                            history of non-advanced adenoma. Previous                            examinations 2011, 2017 Medicines:                Monitored Anesthesia Care Procedure:                Pre-Anesthesia Assessment:                           - Prior to the procedure, a History and Physical                            was performed, and patient medications and                            allergies were reviewed. The patient's tolerance of                            previous anesthesia was also reviewed. The risks                            and benefits of the procedure and the sedation                            options and risks were discussed with the patient.                            All questions were answered, and informed consent                            was obtained. Prior Anticoagulants: The patient has                            taken no previous anticoagulant or antiplatelet                            agents. ASA Grade Assessment: II - A patient with                            mild systemic disease. After reviewing the risks                            and benefits, the patient was deemed in  satisfactory condition to undergo the procedure.                           After obtaining informed consent, the colonoscope                            was passed under direct vision. Throughout the                            procedure, the patient's blood pressure, pulse, and                            oxygen saturations were monitored continuously. The                            Olympus CF-HQ190L ([id number]) Colonoscope was                             introduced through the anus and advanced to the the                            cecum, identified by appendiceal orifice and                            ileocecal valve. The ileocecal valve, appendiceal                            orifice, and rectum were photographed. The quality                            of the bowel preparation was excellent. The                            colonoscopy was performed without difficulty. The                            patient tolerated the procedure well. The bowel                            preparation used was SUPREP/tablets via split dose                            instruction. Scope In: 1:38:18 PM Scope Out: 1:51:10 PM Scope Withdrawal Time: 0 hours 9 minutes 24 seconds  Total Procedure Duration: 0 hours 12 minutes 52 seconds  Findings:                 Multiple diverticula were found in the transverse                            colon and left colon.                           The exam was otherwise without abnormality on  direct and retroflexion views. Complications:            No immediate complications. Estimated blood loss:                            None. Estimated Blood Loss:     Estimated blood loss: none. Impression:               - Diverticulosis in the transverse colon and in the                            left colon.                           - The examination was otherwise normal on direct                            and retroflexion views.                           - No specimens collected. Recommendation:           - Repeat colonoscopy in 10 years for surveillance.                           - Patient has a contact number available for                            emergencies. The signs and symptoms of potential                            delayed complications were discussed with the                            patient. Return to normal activities tomorrow.                            Written  discharge instructions were provided to the                            patient.                           - Resume previous diet.                           - Continue present medications. Docia Chuck. Henrene Pastor, MD 02/13/2021 2:06:12 PM This report has been signed electronically.

## 2021-02-13 NOTE — Patient Instructions (Addendum)
Handout was given to your care partner on diverticulosis. You may resume your current medications today. Recall colonoscopy 10 yrs. Please call if any questions or concerns.    YOU HAD AN ENDOSCOPIC PROCEDURE TODAY AT [facility]:   Refer to the procedure report that was given to you for any specific questions about what was found during the examination.  If the procedure report does not answer your questions, please call your gastroenterologist to clarify.  If you requested that your care partner not be given the details of your procedure findings, then the procedure report has been included in a sealed envelope for you to review at your convenience later.  YOU SHOULD EXPECT: Some feelings of bloating in the abdomen. Passage of more gas than usual.  Walking can help get rid of the air that was put into your GI tract during the procedure and reduce the bloating. If you had a lower endoscopy (such as a colonoscopy or flexible sigmoidoscopy) you may notice spotting of blood in your stool or on the toilet paper. If you underwent a bowel prep for your procedure, you may not have a normal bowel movement for a few days.  Please Note:  You might notice some irritation and congestion in your nose or some drainage.  This is from the oxygen used during your procedure.  There is no need for concern and it should clear up in a day or so.  SYMPTOMS TO REPORT IMMEDIATELY:  Following lower endoscopy (colonoscopy or flexible sigmoidoscopy):  Excessive amounts of blood in the stool  Significant tenderness or worsening of abdominal pains  Swelling of the abdomen that is new, acute  Fever of 100F or higher   For urgent or emergent issues, a gastroenterologist can be reached at any hour by calling [phone]. Do not use MyChart messaging for urgent concerns.    DIET:  We do recommend a small meal at first, but then you may proceed to your regular diet.  Drink plenty of fluids but you  should avoid alcoholic beverages for 24 hours.  ACTIVITY:  You should plan to take it easy for the rest of today and you should NOT DRIVE or use heavy machinery until tomorrow (because of the sedation medicines used during the test).    FOLLOW UP: Our staff will call the number listed on your records 48-72 hours following your procedure to check on you and address any questions or concerns that you may have regarding the information given to you following your procedure. If we do not reach you, we will leave a message.  We will attempt to reach you two times.  During this call, we will ask if you have developed any symptoms of COVID 19. If you develop any symptoms (ie: fever, flu-like symptoms, shortness of breath, cough etc.) before then, please call [phone].  If you test positive for Covid 19 in the 2 weeks post procedure, please call and report this information to [place].    If any biopsies were taken you will be contacted by phone or by letter within the next 1-3 weeks.  Please call us at [phone] if you have not heard about the biopsies in 3 weeks.    SIGNATURES/CONFIDENTIALITY: You and/or your care partner have signed paperwork which will be entered into your electronic medical record.  These signatures attest to the fact that that the information above on your After Visit Summary has been reviewed and is understood.  Full responsibility of the  confidentiality of this discharge information lies with you and/or your care-partner.

## 2021-02-13 NOTE — Progress Notes (Signed)
No problems noted in the recovery room. maw 

## 2021-02-13 NOTE — Progress Notes (Signed)
PT taken to PACU. Monitors in place. VSS. Report given to RN. 

## 2021-02-15 ENCOUNTER — Telehealth: Payer: Self-pay

## 2021-02-15 NOTE — Telephone Encounter (Signed)
Attempted to reach patient for post-procedure f/u call. No answer. Left message that staff will make another attempt to reach him later today and for him to please not hesitate to call us if he has any questions/concerns regarding his care. 

## 2021-02-15 NOTE — Telephone Encounter (Signed)
Attempted to reach pt. With follow-up call following endoscopic procedure 02/13/2021.  LM on pt. Voice mail to call if he has any questions or concerns.

## 2021-03-18 DIAGNOSIS — M5416 Radiculopathy, lumbar region: Secondary | ICD-10-CM | POA: Diagnosis not present

## 2021-03-18 DIAGNOSIS — M48062 Spinal stenosis, lumbar region with neurogenic claudication: Secondary | ICD-10-CM | POA: Diagnosis not present

## 2021-03-19 ENCOUNTER — Ambulatory Visit: Payer: Medicare Other | Admitting: Dermatology

## 2021-04-09 DIAGNOSIS — E7849 Other hyperlipidemia: Secondary | ICD-10-CM | POA: Diagnosis not present

## 2021-04-09 DIAGNOSIS — Z125 Encounter for screening for malignant neoplasm of prostate: Secondary | ICD-10-CM | POA: Diagnosis not present

## 2021-04-09 DIAGNOSIS — I10 Essential (primary) hypertension: Secondary | ICD-10-CM | POA: Diagnosis not present

## 2021-04-09 DIAGNOSIS — G4733 Obstructive sleep apnea (adult) (pediatric): Secondary | ICD-10-CM | POA: Diagnosis not present

## 2021-04-09 DIAGNOSIS — R739 Hyperglycemia, unspecified: Secondary | ICD-10-CM | POA: Diagnosis not present

## 2021-04-09 DIAGNOSIS — K219 Gastro-esophageal reflux disease without esophagitis: Secondary | ICD-10-CM | POA: Diagnosis not present

## 2021-04-16 DIAGNOSIS — I10 Essential (primary) hypertension: Secondary | ICD-10-CM | POA: Diagnosis not present

## 2021-04-16 DIAGNOSIS — M519 Unspecified thoracic, thoracolumbar and lumbosacral intervertebral disc disorder: Secondary | ICD-10-CM | POA: Diagnosis not present

## 2021-04-16 DIAGNOSIS — G4733 Obstructive sleep apnea (adult) (pediatric): Secondary | ICD-10-CM | POA: Diagnosis not present

## 2021-04-16 DIAGNOSIS — R739 Hyperglycemia, unspecified: Secondary | ICD-10-CM | POA: Diagnosis not present

## 2021-04-16 DIAGNOSIS — K219 Gastro-esophageal reflux disease without esophagitis: Secondary | ICD-10-CM | POA: Diagnosis not present

## 2021-04-16 DIAGNOSIS — M5090 Cervical disc disorder, unspecified, unspecified cervical region: Secondary | ICD-10-CM | POA: Diagnosis not present

## 2021-04-16 DIAGNOSIS — Z0000 Encounter for general adult medical examination without abnormal findings: Secondary | ICD-10-CM | POA: Diagnosis not present

## 2021-04-16 DIAGNOSIS — E7849 Other hyperlipidemia: Secondary | ICD-10-CM | POA: Diagnosis not present

## 2021-04-19 DIAGNOSIS — M4802 Spinal stenosis, cervical region: Secondary | ICD-10-CM | POA: Diagnosis not present

## 2021-04-19 DIAGNOSIS — M5412 Radiculopathy, cervical region: Secondary | ICD-10-CM | POA: Diagnosis not present

## 2021-04-22 DIAGNOSIS — M48062 Spinal stenosis, lumbar region with neurogenic claudication: Secondary | ICD-10-CM | POA: Diagnosis not present

## 2021-04-22 DIAGNOSIS — M5136 Other intervertebral disc degeneration, lumbar region: Secondary | ICD-10-CM | POA: Diagnosis not present

## 2021-04-22 DIAGNOSIS — M5412 Radiculopathy, cervical region: Secondary | ICD-10-CM | POA: Diagnosis not present

## 2021-04-22 DIAGNOSIS — M4802 Spinal stenosis, cervical region: Secondary | ICD-10-CM | POA: Diagnosis not present

## 2021-04-22 DIAGNOSIS — M5416 Radiculopathy, lumbar region: Secondary | ICD-10-CM | POA: Diagnosis not present

## 2021-04-22 DIAGNOSIS — M25511 Pain in right shoulder: Secondary | ICD-10-CM | POA: Diagnosis not present

## 2021-04-22 DIAGNOSIS — M5030 Other cervical disc degeneration, unspecified cervical region: Secondary | ICD-10-CM | POA: Diagnosis not present

## 2021-04-30 DIAGNOSIS — M48062 Spinal stenosis, lumbar region with neurogenic claudication: Secondary | ICD-10-CM | POA: Diagnosis not present

## 2021-04-30 DIAGNOSIS — M5416 Radiculopathy, lumbar region: Secondary | ICD-10-CM | POA: Diagnosis not present

## 2021-05-03 DIAGNOSIS — G8929 Other chronic pain: Secondary | ICD-10-CM | POA: Diagnosis not present

## 2021-05-03 DIAGNOSIS — M7581 Other shoulder lesions, right shoulder: Secondary | ICD-10-CM | POA: Diagnosis not present

## 2021-05-03 DIAGNOSIS — M25511 Pain in right shoulder: Secondary | ICD-10-CM | POA: Diagnosis not present

## 2021-05-03 DIAGNOSIS — Z9889 Other specified postprocedural states: Secondary | ICD-10-CM | POA: Diagnosis not present

## 2021-06-10 DIAGNOSIS — M5416 Radiculopathy, lumbar region: Secondary | ICD-10-CM | POA: Diagnosis not present

## 2021-06-10 DIAGNOSIS — M5136 Other intervertebral disc degeneration, lumbar region: Secondary | ICD-10-CM | POA: Diagnosis not present

## 2021-06-10 DIAGNOSIS — M4802 Spinal stenosis, cervical region: Secondary | ICD-10-CM | POA: Diagnosis not present

## 2021-06-10 DIAGNOSIS — M5030 Other cervical disc degeneration, unspecified cervical region: Secondary | ICD-10-CM | POA: Diagnosis not present

## 2021-06-10 DIAGNOSIS — M5412 Radiculopathy, cervical region: Secondary | ICD-10-CM | POA: Diagnosis not present

## 2021-06-10 DIAGNOSIS — M48062 Spinal stenosis, lumbar region with neurogenic claudication: Secondary | ICD-10-CM | POA: Diagnosis not present

## 2021-07-03 ENCOUNTER — Encounter: Payer: Self-pay | Admitting: Dermatology

## 2021-07-03 ENCOUNTER — Ambulatory Visit (INDEPENDENT_AMBULATORY_CARE_PROVIDER_SITE_OTHER): Payer: PPO | Admitting: Dermatology

## 2021-07-03 DIAGNOSIS — L578 Other skin changes due to chronic exposure to nonionizing radiation: Secondary | ICD-10-CM | POA: Diagnosis not present

## 2021-07-03 DIAGNOSIS — L570 Actinic keratosis: Secondary | ICD-10-CM

## 2021-07-03 DIAGNOSIS — L814 Other melanin hyperpigmentation: Secondary | ICD-10-CM

## 2021-07-03 DIAGNOSIS — Z85828 Personal history of other malignant neoplasm of skin: Secondary | ICD-10-CM | POA: Diagnosis not present

## 2021-07-03 DIAGNOSIS — L821 Other seborrheic keratosis: Secondary | ICD-10-CM | POA: Diagnosis not present

## 2021-07-03 NOTE — Patient Instructions (Signed)

## 2021-07-03 NOTE — Progress Notes (Signed)
? ?  Follow-Up Visit ?  ?Subjective  ?Rodney Clark is a 66 y.o. male who presents for the following: Actinic Keratosis (Face, 27mf/u, pt used 5FU/Calcipotriene cr to nose and temples bid x 7 days). ?The patient has spots, moles and lesions to be evaluated, some may be new or changing and the patient has concerns that these could be cancer. ? ?The following portions of the chart were reviewed this encounter and updated as appropriate:  ? Tobacco  Allergies  Meds  Problems  Med Hx  Surg Hx  Fam Hx   ?  ?Review of Systems:  No other skin or systemic complaints except as noted in HPI or Assessment and Plan. ? ?Objective  ?Well appearing patient in no apparent distress; mood and affect are within normal limits. ? ?A focused examination was performed including face, scalp, arms. Relevant physical exam findings are noted in the Assessment and Plan. ? ?nose x 2 (2) ?Pink scaly macules ? ? ?Assessment & Plan  ? ?Actinic Damage ?- chronic, secondary to cumulative UV radiation exposure/sun exposure over time ?- diffuse scaly erythematous macules with underlying dyspigmentation ?- Recommend daily broad spectrum sunscreen SPF 30+ to sun-exposed areas, reapply every 2 hours as needed.  ?- Recommend staying in the shade or wearing long sleeves, sun glasses (UVA+UVB protection) and wide brim hats (4-inch brim around the entire circumference of the hat). ?- Call for new or changing lesions.  ? ?Seborrheic Keratoses ?- Stuck-on, waxy, tan-brown papules and/or plaques  ?- Benign-appearing ?- Discussed benign etiology and prognosis. ?- Observe ?- Call for any changes ? ?Lentigines ?- Scattered tan macules ?- Due to sun exposure ?- Benign-appering, observe ?- Recommend daily broad spectrum sunscreen SPF 30+ to sun-exposed areas, reapply every 2 hours as needed. ?- Call for any changes ?  ?AK (actinic keratosis) (2) ?nose x 2 ? ?If areas on nose not resolved in 2 months, pt may use the 5FU/Calcipotriene cream bid for 7 days to aa  nose. ? ?Destruction of lesion - nose x 2 ?Complexity: simple   ?Destruction method: cryotherapy   ?Informed consent: discussed and consent obtained   ?Timeout:  patient name, date of birth, surgical site, and procedure verified ?Lesion destroyed using liquid nitrogen: Yes   ?Region frozen until ice ball extended beyond lesion: Yes   ?Outcome: patient tolerated procedure well with no complications   ?Post-procedure details: wound care instructions given   ? ?Related Medications ?fluorouracil (EFUDEX) 5 % cream ?Apply to the temples and nose BID x 7 days. ? ?History of Squamous Cell Carcinoma of the Skin ?- No evidence of recurrence today ?- No lymphadenopathy ?- Recommend regular full body skin exams ?- Recommend daily broad spectrum sunscreen SPF 30+ to sun-exposed areas, reapply every 2 hours as needed.  ?- Call if any new or changing lesions are noted between office visits ?- L nasal supratip ?  ?Return in about 6 months (around 01/02/2022) for TBSE, Hx of SCC, Hx of BCC. ? ?I, SOthelia Pulling RMA, am acting as scribe for DSarina Ser MD . ?Documentation: I have reviewed the above documentation for accuracy and completeness, and I agree with the above. ? ?DSarina Ser MD ? ?

## 2021-07-04 ENCOUNTER — Encounter: Payer: Self-pay | Admitting: Dermatology

## 2021-08-21 DIAGNOSIS — M4802 Spinal stenosis, cervical region: Secondary | ICD-10-CM | POA: Diagnosis not present

## 2021-08-21 DIAGNOSIS — M5412 Radiculopathy, cervical region: Secondary | ICD-10-CM | POA: Diagnosis not present

## 2021-08-22 DIAGNOSIS — M5416 Radiculopathy, lumbar region: Secondary | ICD-10-CM | POA: Diagnosis not present

## 2021-08-22 DIAGNOSIS — M48062 Spinal stenosis, lumbar region with neurogenic claudication: Secondary | ICD-10-CM | POA: Diagnosis not present

## 2021-08-22 DIAGNOSIS — M5412 Radiculopathy, cervical region: Secondary | ICD-10-CM | POA: Diagnosis not present

## 2021-08-22 DIAGNOSIS — M542 Cervicalgia: Secondary | ICD-10-CM | POA: Diagnosis not present

## 2021-09-03 DIAGNOSIS — Z1388 Encounter for screening for disorder due to exposure to contaminants: Secondary | ICD-10-CM | POA: Diagnosis not present

## 2021-09-04 ENCOUNTER — Other Ambulatory Visit: Payer: Self-pay | Admitting: Student

## 2021-09-04 DIAGNOSIS — M542 Cervicalgia: Secondary | ICD-10-CM

## 2021-09-12 ENCOUNTER — Ambulatory Visit
Admission: RE | Admit: 2021-09-12 | Discharge: 2021-09-12 | Disposition: A | Discharge status: Home / Self Care | Payer: PPO | Source: Ambulatory Visit | Attending: Student | Admitting: Student

## 2021-09-12 DIAGNOSIS — M542 Cervicalgia: Secondary | ICD-10-CM

## 2021-09-12 DIAGNOSIS — R200 Anesthesia of skin: Secondary | ICD-10-CM | POA: Diagnosis not present

## 2021-09-12 DIAGNOSIS — M4802 Spinal stenosis, cervical region: Secondary | ICD-10-CM | POA: Diagnosis not present

## 2021-09-18 ENCOUNTER — Encounter: Payer: Self-pay | Admitting: Emergency Medicine

## 2021-09-18 ENCOUNTER — Observation Stay
Admission: EM | Admit: 2021-09-18 | Discharge: 2021-09-20 | Disposition: A | Discharge status: Home / Self Care | Payer: PPO | Attending: Internal Medicine | Admitting: Internal Medicine

## 2021-09-18 ENCOUNTER — Other Ambulatory Visit: Payer: Self-pay

## 2021-09-18 ENCOUNTER — Emergency Department: Payer: PPO

## 2021-09-18 DIAGNOSIS — I700 Atherosclerosis of aorta: Secondary | ICD-10-CM | POA: Diagnosis not present

## 2021-09-18 DIAGNOSIS — I10 Essential (primary) hypertension: Secondary | ICD-10-CM | POA: Diagnosis not present

## 2021-09-18 DIAGNOSIS — Z79899 Other long term (current) drug therapy: Secondary | ICD-10-CM | POA: Diagnosis not present

## 2021-09-18 DIAGNOSIS — G4733 Obstructive sleep apnea (adult) (pediatric): Secondary | ICD-10-CM

## 2021-09-18 DIAGNOSIS — K4090 Unilateral inguinal hernia, without obstruction or gangrene, not specified as recurrent: Secondary | ICD-10-CM | POA: Diagnosis not present

## 2021-09-18 DIAGNOSIS — R1011 Right upper quadrant pain: Secondary | ICD-10-CM | POA: Diagnosis not present

## 2021-09-18 DIAGNOSIS — I712 Thoracic aortic aneurysm, without rupture, unspecified: Secondary | ICD-10-CM

## 2021-09-18 DIAGNOSIS — R0902 Hypoxemia: Secondary | ICD-10-CM

## 2021-09-18 DIAGNOSIS — R1013 Epigastric pain: Secondary | ICD-10-CM

## 2021-09-18 DIAGNOSIS — K8020 Calculus of gallbladder without cholecystitis without obstruction: Secondary | ICD-10-CM

## 2021-09-18 DIAGNOSIS — R079 Chest pain, unspecified: Secondary | ICD-10-CM

## 2021-09-18 DIAGNOSIS — Z85828 Personal history of other malignant neoplasm of skin: Secondary | ICD-10-CM | POA: Insufficient documentation

## 2021-09-18 DIAGNOSIS — M545 Low back pain, unspecified: Secondary | ICD-10-CM | POA: Insufficient documentation

## 2021-09-18 DIAGNOSIS — M47816 Spondylosis without myelopathy or radiculopathy, lumbar region: Secondary | ICD-10-CM | POA: Diagnosis not present

## 2021-09-18 DIAGNOSIS — E66813 Obesity, class 3: Secondary | ICD-10-CM

## 2021-09-18 DIAGNOSIS — R0789 Other chest pain: Secondary | ICD-10-CM | POA: Diagnosis not present

## 2021-09-18 DIAGNOSIS — M549 Dorsalgia, unspecified: Secondary | ICD-10-CM

## 2021-09-18 DIAGNOSIS — M461 Sacroiliitis, not elsewhere classified: Secondary | ICD-10-CM

## 2021-09-18 DIAGNOSIS — J9621 Acute and chronic respiratory failure with hypoxia: Secondary | ICD-10-CM | POA: Diagnosis not present

## 2021-09-18 DIAGNOSIS — I2510 Atherosclerotic heart disease of native coronary artery without angina pectoris: Secondary | ICD-10-CM | POA: Diagnosis not present

## 2021-09-18 DIAGNOSIS — G629 Polyneuropathy, unspecified: Secondary | ICD-10-CM

## 2021-09-18 LAB — CBC WITH DIFFERENTIAL/PLATELET
Abs Immature Granulocytes: 0.04 10*3/uL (ref 0.00–0.07)
Basophils Absolute: 0.1 10*3/uL (ref 0.0–0.1)
Basophils Relative: 1 %
Eosinophils Absolute: 0.4 10*3/uL (ref 0.0–0.5)
Eosinophils Relative: 3 %
HCT: 48.3 % (ref 39.0–52.0)
Hemoglobin: 16.1 g/dL (ref 13.0–17.0)
Immature Granulocytes: 0 %
Lymphocytes Relative: 20 %
Lymphs Abs: 2.5 10*3/uL (ref 0.7–4.0)
MCH: 29 pg (ref 26.0–34.0)
MCHC: 33.3 g/dL (ref 30.0–36.0)
MCV: 86.9 fL (ref 80.0–100.0)
Monocytes Absolute: 1.3 10*3/uL — ABNORMAL HIGH (ref 0.1–1.0)
Monocytes Relative: 10 %
Neutro Abs: 8.2 10*3/uL — ABNORMAL HIGH (ref 1.7–7.7)
Neutrophils Relative %: 66 %
Platelets: 320 10*3/uL (ref 150–400)
RBC: 5.56 MIL/uL (ref 4.22–5.81)
RDW: 12.4 % (ref 11.5–15.5)
WBC: 12.4 10*3/uL — ABNORMAL HIGH (ref 4.0–10.5)
nRBC: 0 % (ref 0.0–0.2)

## 2021-09-18 LAB — URINALYSIS, ROUTINE W REFLEX MICROSCOPIC
Bilirubin Urine: NEGATIVE
Glucose, UA: NEGATIVE mg/dL
Hgb urine dipstick: NEGATIVE
Ketones, ur: NEGATIVE mg/dL
Leukocytes,Ua: NEGATIVE
Nitrite: NEGATIVE
Protein, ur: NEGATIVE mg/dL
Specific Gravity, Urine: 1.008 (ref 1.005–1.030)
pH: 7 (ref 5.0–8.0)

## 2021-09-18 LAB — COMPREHENSIVE METABOLIC PANEL
ALT: 29 U/L (ref 0–44)
AST: 32 U/L (ref 15–41)
Albumin: 4.6 g/dL (ref 3.5–5.0)
Alkaline Phosphatase: 35 U/L — ABNORMAL LOW (ref 38–126)
Anion gap: 9 (ref 5–15)
BUN: 14 mg/dL (ref 8–23)
CO2: 26 mmol/L (ref 22–32)
Calcium: 10.7 mg/dL — ABNORMAL HIGH (ref 8.9–10.3)
Chloride: 102 mmol/L (ref 98–111)
Creatinine, Ser: 0.74 mg/dL (ref 0.61–1.24)
GFR, Estimated: >60 mL/min (ref >60)
Glucose, Bld: 115 mg/dL — ABNORMAL HIGH (ref 70–99)
Potassium: 3.7 mmol/L (ref 3.5–5.1)
Sodium: 137 mmol/L (ref 135–145)
Total Bilirubin: 0.8 mg/dL (ref 0.3–1.2)
Total Protein: 8.4 g/dL — ABNORMAL HIGH (ref 6.5–8.1)

## 2021-09-18 LAB — TROPONIN I (HIGH SENSITIVITY): Troponin I (High Sensitivity): 4 ng/L (ref <18)

## 2021-09-18 LAB — LIPASE, BLOOD: Lipase: 39 U/L (ref 11–51)

## 2021-09-18 MED ORDER — ONDANSETRON HCL 4 MG/2ML IJ SOLN
4.0000 mg | Freq: Once | INTRAMUSCULAR | Status: AC
  Administered 2021-09-18: 4 mg via INTRAVENOUS

## 2021-09-18 MED ORDER — KETOROLAC TROMETHAMINE 15 MG/ML IJ SOLN
15.0000 mg | Freq: Once | INTRAMUSCULAR | Status: AC
  Administered 2021-09-19: 15 mg via INTRAVENOUS
  Filled 2021-09-18: qty 1

## 2021-09-18 MED ORDER — ALUM & MAG HYDROXIDE-SIMETH 200-200-20 MG/5ML PO SUSP
30.0000 mL | Freq: Once | ORAL | Status: AC
  Administered 2021-09-19: 30 mL via ORAL
  Filled 2021-09-18: qty 30

## 2021-09-18 MED ORDER — IOHEXOL 350 MG/ML SOLN
100.0000 mL | Freq: Once | INTRAVENOUS | Status: AC | PRN
  Administered 2021-09-18: 100 mL via INTRAVENOUS

## 2021-09-18 MED ORDER — MORPHINE SULFATE (PF) 4 MG/ML IV SOLN
4.0000 mg | Freq: Once | INTRAVENOUS | Status: AC
  Administered 2021-09-18: 4 mg via INTRAVENOUS

## 2021-09-18 MED ORDER — MORPHINE SULFATE (PF) 4 MG/ML IV SOLN
INTRAVENOUS | Status: AC
  Filled 2021-09-18: qty 1

## 2021-09-18 MED ORDER — ONDANSETRON HCL 4 MG/2ML IJ SOLN
INTRAMUSCULAR | Status: AC
  Filled 2021-09-18: qty 2

## 2021-09-18 NOTE — ED Triage Notes (Signed)
Pt presents to ER from home, reports severe pain right lower back pain started last night, pt reports severe pain today, denies any fever, no blood in urine, no difficulty voiding.

## 2021-09-18 NOTE — ED Provider Notes (Signed)
[facility] Provider Note    Event Date/Time   First MD Initiated Contact with Patient 09/18/21 2159     (approximate)   History   Flank Pain   HPI  Rodney Clark is a 66 y.o. male past medical history of OSA, hypertension hyperlipidemia who presents with back and abdominal pain.  Symptoms started yesterday around 9.  Pain is in the right low back radiating around to the abdomen.  He also endorses some pain in epigastric region and lower chest.  Has history of back pain in the past with right radicular symptoms but says this feels different.  He is actually scheduled for an outpatient MRI tomorrow.  He denies any dyspnea no nausea vomiting or urinary symptoms.  Denies bowel bladder incontinence no saddle anesthesia no new numbness tingling in the leg he has chronic numbness in the right leg.    Past Medical History:  Diagnosis Date   Actinic keratosis    Allergic state    Allergy    Arthritis    Atypical chest pain    GERD (gastroesophageal reflux disease)    Gout    Hyperglycemia    Hyperlipidemia    Hypertension    Neuromuscular disorder (HCC)    nerve damage right leg   Numbness of toes    Sleep apnea    wears CPAP   Squamous cell carcinoma of skin 11/16/2018   left nasal supratip (Dr. Evorn Gong). Tx: LN2, 5FU/Calcipotriene cream   Squamous cell carcinoma of skin    L temple, txted yrs ago    Patient Active Problem List   Diagnosis Date Noted   Spondylolisthesis at L5-S1 level 01/05/2018     Physical Exam  Triage Vital Signs: ED Triage Vitals  Enc Vitals Group     BP 09/18/21 2142 (!) 155/99     Pulse Rate 09/18/21 2142 88     Resp 09/18/21 2142 17     Temp 09/18/21 2142 99.6 F (37.6 C)     Temp Source 09/18/21 2142 Oral     SpO2 09/18/21 2142 93 %     Weight 09/18/21 2142 280 lb (127 kg)     Height 09/18/21 2142 '5\' 10"'$  (1.778 m)     Head Circumference --      Peak Flow --      Pain Score 09/18/21 2141 5     Pain Loc --       Pain Edu? --      Excl. in [place]? --     Most recent vital signs: Vitals:   09/18/21 2142  BP: (!) 155/99  Pulse: 88  Resp: 17  Temp: 99.6 F (37.6 C)  SpO2: 93%     General: Awake, no distress.  CV:  Good peripheral perfusion.  No peripheral edema Resp:  Normal effort.  Abd:  No distention.  Mild tenderness in bilateral lower quadrants Neuro:             Awake, Alert, Oriented x 3  Other:  No midline lumbar tenderness, right paraspinal tenderness in the lumbar region 5 out of 5 strength with plantarflexion dorsiflexion, hip flexion strength is limited secondary to pain in the right lower extremity, 5-5 on the left   ED Results / Procedures / Treatments  Labs (all labs ordered are listed, but only abnormal results are displayed) Labs Reviewed  URINALYSIS, ROUTINE W REFLEX MICROSCOPIC - Abnormal; Notable for the following components:      Result Value  Color, Urine YELLOW (*)    APPearance CLEAR (*)    All other components within normal limits  COMPREHENSIVE METABOLIC PANEL - Abnormal; Notable for the following components:   Glucose, Bld 115 (*)    Calcium 10.7 (*)    Total Protein 8.4 (*)    Alkaline Phosphatase 35 (*)    All other components within normal limits  CBC WITH DIFFERENTIAL/PLATELET - Abnormal; Notable for the following components:   WBC 12.4 (*)    Neutro Abs 8.2 (*)    Monocytes Absolute 1.3 (*)    All other components within normal limits  LIPASE, BLOOD  TROPONIN I (HIGH SENSITIVITY)     EKG  EKG reviewed and interpreted myself shows left axis deviation normal sinus rhythm no acute ischemic changes   RADIOLOGY CTA reviewed and interperted by myself is negative for dissection   PROCEDURES:  Critical Care performed: No  .1-3 Lead EKG Interpretation  Performed by: Rada Hay, MD Authorized by: Rada Hay, MD     Interpretation: normal     ECG rate assessment: normal     Rhythm: sinus rhythm     Ectopy: none      Conduction: normal     The patient is on the cardiac monitor to evaluate for evidence of arrhythmia and/or significant heart rate changes.   MEDICATIONS ORDERED IN ED: Medications  ketorolac (TORADOL) 15 MG/ML injection 15 mg (has no administration in time range)  alum & mag hydroxide-simeth (MAALOX/MYLANTA) 200-200-20 MG/5ML suspension 30 mL (has no administration in time range)  morphine (PF) 4 MG/ML injection 4 mg (4 mg Intravenous Given 09/18/21 2245)  ondansetron (ZOFRAN) injection 4 mg (4 mg Intravenous Given 09/18/21 2244)  iohexol (OMNIPAQUE) 350 MG/ML injection 100 mL (100 mLs Intravenous Contrast Given 09/18/21 2301)     IMPRESSION / MDM / ASSESSMENT AND PLAN / ED COURSE  I reviewed the triage vital signs and the nursing notes.                              Patient's presentation is most consistent with acute presentation with potential threat to life or bodily function.  Differential diagnosis includes, but is not limited to, nephrolithiasis, aortic dissection, AAA, musculoskeletal pain  Patient is a 66 year old male presents with right back pain as well as abdominal pain and chest pain.  Symptoms started yesterday primarily with the right low back pain.  He has chronic back pain with radicular symptoms to the right leg is to be new has no new neurologic symptoms.  Radiates around to the right abdomen and up into the chest.  Patient looks uncomfortable on my assessment.  Mildly hypertensive but vitals otherwise within normal limits.  Abdomen mildly tender throughout no midline lumbar tenderness there is right paraspinal tenderness.  Strength exam overall reassuring, hip flexion testing is limited in the right lower extremity secondary to pain.  Sensation intact.  Differential includes musculoskeletal back pain as well as the above differential.  I have low suspicion for cauda equina syndrome given no new red flag symptoms.  Will obtain a CTA to rule out dissection AAA etc.  We will  check labs and get a treat pain with morphine.   CTA is negative for any acute findings.  On reassessment patient feeling somewhat improved but still complains of low back pain and some chest discomfort.  Plan to give Toradol and GI cocktail we will repeat the cardiac enzymes.  Suspect that this is musculoskeletal.  Patient signed out pending repeat troponin.        FINAL CLINICAL IMPRESSION(S) / ED DIAGNOSES   Final diagnoses:  Back pain, unspecified back location, unspecified back pain laterality, unspecified chronicity     Rx / DC Orders   ED Discharge Orders     None        Note:  This document was prepared using Dragon voice recognition software and may include unintentional dictation errors.   Rada Hay, MD 09/18/21 [phone]

## 2021-09-19 ENCOUNTER — Observation Stay: Payer: PPO

## 2021-09-19 ENCOUNTER — Inpatient Hospital Stay: Admission: RE | Admit: 2021-09-19 | Payer: PPO | Source: Ambulatory Visit

## 2021-09-19 ENCOUNTER — Encounter: Payer: Self-pay | Admitting: Internal Medicine

## 2021-09-19 DIAGNOSIS — R1013 Epigastric pain: Secondary | ICD-10-CM

## 2021-09-19 DIAGNOSIS — G4733 Obstructive sleep apnea (adult) (pediatric): Secondary | ICD-10-CM

## 2021-09-19 DIAGNOSIS — R0789 Other chest pain: Secondary | ICD-10-CM | POA: Diagnosis not present

## 2021-09-19 DIAGNOSIS — E66813 Obesity, class 3: Secondary | ICD-10-CM

## 2021-09-19 DIAGNOSIS — R109 Unspecified abdominal pain: Secondary | ICD-10-CM | POA: Diagnosis not present

## 2021-09-19 DIAGNOSIS — K8020 Calculus of gallbladder without cholecystitis without obstruction: Secondary | ICD-10-CM

## 2021-09-19 DIAGNOSIS — G629 Polyneuropathy, unspecified: Secondary | ICD-10-CM

## 2021-09-19 DIAGNOSIS — I712 Thoracic aortic aneurysm, without rupture, unspecified: Secondary | ICD-10-CM

## 2021-09-19 DIAGNOSIS — Z8719 Personal history of other diseases of the digestive system: Secondary | ICD-10-CM | POA: Diagnosis not present

## 2021-09-19 DIAGNOSIS — I7121 Aneurysm of the ascending aorta, without rupture: Secondary | ICD-10-CM

## 2021-09-19 DIAGNOSIS — I10 Essential (primary) hypertension: Secondary | ICD-10-CM

## 2021-09-19 HISTORY — DX: Calculus of gallbladder without cholecystitis without obstruction: K80.20

## 2021-09-19 HISTORY — DX: Polyneuropathy, unspecified: G62.9

## 2021-09-19 LAB — TROPONIN I (HIGH SENSITIVITY): Troponin I (High Sensitivity): 5 ng/L (ref <18)

## 2021-09-19 MED ORDER — METHYLPREDNISOLONE SODIUM SUCC 40 MG IJ SOLR
40.0000 mg | Freq: Every day | INTRAMUSCULAR | Status: DC
  Administered 2021-09-19: 40 mg via INTRAVENOUS
  Filled 2021-09-19: qty 1

## 2021-09-19 MED ORDER — LOSARTAN POTASSIUM 50 MG PO TABS
50.0000 mg | ORAL_TABLET | Freq: Every day | ORAL | Status: DC
  Administered 2021-09-19 – 2021-09-20 (×2): 50 mg via ORAL
  Filled 2021-09-19 (×2): qty 1

## 2021-09-19 MED ORDER — GABAPENTIN 300 MG PO CAPS
300.0000 mg | ORAL_CAPSULE | Freq: Every day | ORAL | Status: DC
  Administered 2021-09-19: 300 mg via ORAL
  Filled 2021-09-19: qty 1

## 2021-09-19 MED ORDER — ACETAMINOPHEN 650 MG RE SUPP: 650.0000 mg | Freq: Four times a day (QID) | RECTAL | Status: DC | PRN

## 2021-09-19 MED ORDER — MORPHINE SULFATE (PF) 2 MG/ML IV SOLN: 2.0000 mg | INTRAVENOUS | Status: DC | PRN

## 2021-09-19 MED ORDER — HYDROCODONE-ACETAMINOPHEN 5-325 MG PO TABS: 1.0000 | ORAL_TABLET | ORAL | Status: DC | PRN

## 2021-09-19 MED ORDER — HYDROCHLOROTHIAZIDE 12.5 MG PO TABS
12.5000 mg | ORAL_TABLET | Freq: Every day | ORAL | Status: DC
  Administered 2021-09-19 – 2021-09-20 (×2): 12.5 mg via ORAL
  Filled 2021-09-19 (×2): qty 1

## 2021-09-19 MED ORDER — ONDANSETRON HCL 4 MG/2ML IJ SOLN: 4.0000 mg | Freq: Four times a day (QID) | INTRAMUSCULAR | Status: DC | PRN

## 2021-09-19 MED ORDER — ONDANSETRON HCL 4 MG PO TABS: 4.0000 mg | ORAL_TABLET | Freq: Four times a day (QID) | ORAL | Status: DC | PRN

## 2021-09-19 MED ORDER — TECHNETIUM TC 99M MEBROFENIN IV KIT
5.0000 | PACK | Freq: Once | INTRAVENOUS | Status: AC
  Administered 2021-09-19: 4.58 via INTRAVENOUS
  Filled 2021-09-19: qty 5

## 2021-09-19 MED ORDER — FAMOTIDINE 20 MG PO TABS
20.0000 mg | ORAL_TABLET | Freq: Every day | ORAL | Status: DC
  Administered 2021-09-19 – 2021-09-20 (×2): 20 mg via ORAL
  Filled 2021-09-19 (×2): qty 1

## 2021-09-19 MED ORDER — ENOXAPARIN SODIUM 80 MG/0.8ML IJ SOSY
0.5000 mg/kg | PREFILLED_SYRINGE | INTRAMUSCULAR | Status: DC
  Filled 2021-09-19: qty 0.63

## 2021-09-19 MED ORDER — ACETAMINOPHEN 325 MG PO TABS: 650.0000 mg | ORAL_TABLET | Freq: Four times a day (QID) | ORAL | Status: DC | PRN

## 2021-09-19 MED ORDER — FENOFIBRATE 160 MG PO TABS
160.0000 mg | ORAL_TABLET | Freq: Every day | ORAL | Status: DC
  Administered 2021-09-19 – 2021-09-20 (×2): 160 mg via ORAL
  Filled 2021-09-19 (×2): qty 1

## 2021-09-19 MED ORDER — PANTOPRAZOLE SODIUM 40 MG IV SOLR
40.0000 mg | Freq: Two times a day (BID) | INTRAVENOUS | Status: DC
  Administered 2021-09-19 – 2021-09-20 (×2): 40 mg via INTRAVENOUS
  Filled 2021-09-19 (×2): qty 10

## 2021-09-19 MED ORDER — TIZANIDINE HCL 4 MG PO TABS
4.0000 mg | ORAL_TABLET | Freq: Three times a day (TID) | ORAL | Status: DC
  Administered 2021-09-19 – 2021-09-20 (×4): 4 mg via ORAL
  Filled 2021-09-19: qty 1
  Filled 2021-09-19: qty 2
  Filled 2021-09-19: qty 1
  Filled 2021-09-19: qty 2

## 2021-09-19 MED ORDER — GABAPENTIN 300 MG PO CAPS
300.0000 mg | ORAL_CAPSULE | Freq: Four times a day (QID) | ORAL | Status: DC
  Administered 2021-09-19: 300 mg via ORAL
  Filled 2021-09-19 (×2): qty 1

## 2021-09-19 MED ORDER — GABAPENTIN 300 MG PO CAPS
600.0000 mg | ORAL_CAPSULE | Freq: Two times a day (BID) | ORAL | Status: DC
Start: 2021-09-19 — End: 2021-09-20
  Administered 2021-09-19 – 2021-09-20 (×2): 600 mg via ORAL
  Filled 2021-09-19 (×2): qty 2

## 2021-09-19 MED ORDER — SODIUM CHLORIDE 0.9 % IV SOLN: INTRAVENOUS | Status: DC

## 2021-09-19 NOTE — Assessment & Plan Note (Deleted)
We will check overnight oximetry.  No documentation of low pulse ox except for physicians note.

## 2021-09-19 NOTE — ED Notes (Signed)
Patient provided sandwich tray, diet cola per request.

## 2021-09-19 NOTE — Progress Notes (Signed)
Admission profile updated. ?

## 2021-09-19 NOTE — Consult Note (Signed)
[facility] SURGICAL CONSULTATION NOTE (initial) - cpt: [phone]  HISTORY OF PRESENT ILLNESS (HPI):  66 y.o. male presented to [facility] ED last night for evaluation of upper abdominal pain/lower chest pain. Patient reported the acute onset of right sided flak/back pain the day prior to presentation at 9 PM. He did endorse this was about an hour after dinner which was a pork sandwich, No history of previous post-prandial pain or discomfort. This seemed to radiate into his right upper abdomen/epigastrium/right lower chest. He thought this may be indigestion. He does have a history of back pain with right sided radiculopathy but endorses this is a different type of pain. No fever, chills, nausea, emesis, or bowel changes. No history of similar pain. Previous intra-abdominal surgeries include appendectomy and hernia repair x2. Work up in the ED revealed a mild leukocytosis to 12.4K, lipase was normal at 39, LFTs and bilirubin levels were also normal. He initially underwent CTA Chest/Abdomen/Pelvis which was reassuring and showed a gallstone. He was ultimately admitted to the medicine service for persistent hypoxia to 88%. RUQ [place] was obtained given CT finding of cholelithiasis which was confirmed but he was without evidence of cholecystitis.   Surgery is consulted by hospitalist physician Dr. Loletha Grayer, MD in this context for evaluation and management of cholelithiasis without obvious evidence of cholecystitis.  PAST MEDICAL HISTORY (PMH):  Past Medical History:  Diagnosis Date   Actinic keratosis    Allergic state    Allergy    Arthritis    Atypical chest pain    GERD (gastroesophageal reflux disease)    Gout    Hyperglycemia    Hyperlipidemia    Hypertension    Neuromuscular disorder (HCC)    nerve damage right leg   Numbness of toes    Sleep apnea    wears CPAP   Squamous cell carcinoma of skin 11/16/2018   left nasal supratip (Dr. Evorn Gong). Tx: LN2, 5FU/Calcipotriene cream    Squamous cell carcinoma of skin    L temple, txted yrs ago     PAST SURGICAL HISTORY ([place]):  Past Surgical History:  Procedure Laterality Date   APPENDECTOMY     BACK SURGERY  01/05/2018   CATARACT EXTRACTION Bilateral    COLONOSCOPY N/A 05/14/2015   Procedure: COLONOSCOPY;  Surgeon: Manya Silvas, MD;  Location: [place];  Service: Endoscopy;  Laterality: N/A;   COLONOSCOPY     HERNIA REPAIR     x2   ROTATOR CUFF REPAIR Right    TONSILLECTOMY       MEDICATIONS:  Prior to Admission medications   Medication Sig Start Date End Date Taking? Authorizing Provider  famotidine (PEPCID) 20 MG tablet Take 20 mg by mouth daily.    Yes [provider]  fenofibrate 160 MG tablet Take 160 mg by mouth daily.   Yes [provider]  fexofenadine (ALLEGRA) 180 MG tablet Take 180 mg by mouth daily.   Yes [provider]  gabapentin (NEURONTIN) 300 MG capsule Take 300 mg by mouth 4 (four) times daily.   Yes [provider]  hydrochlorothiazide (HYDRODIURIL) 12.5 MG tablet Take 1 tablet by mouth daily. 12/06/20  Yes [provider]  losartan (COZAAR) 50 MG tablet Take 50 mg by mouth daily.   Yes [provider]  tiZANidine (ZANAFLEX) 4 MG tablet Take 4 mg by mouth 3 (three) times daily. Only uses PRN 12/08/17  Yes [provider]  diazepam (VALIUM) 5 MG tablet Take by mouth. Patient not  taking: Reported on 09/19/2021 09/13/21   [provider]     ALLERGIES:  Allergies  Allergen Reactions   Crestor [Rosuvastatin] Other (See Comments)    Joint pain   Lyrica [Pregabalin]     "Hung around too long and made me feel funny"     SOCIAL HISTORY:  Social History   Socioeconomic History   Marital status: Married    Spouse name: Not on file   Number of children: Not on file   Years of education: Not on file   Highest education level: Not on file  Occupational History   Not on file  Tobacco Use   Smoking status: Never    Smokeless tobacco: Never  Vaping Use   Vaping Use: Never used  Substance and Sexual Activity   Alcohol use: Yes    Alcohol/week: 3.0 standard drinks of alcohol    Types: 3 Cans of beer per week   Drug use: Never   Sexual activity: Not on file  Other Topics Concern   Not on file  Social History Narrative   Not on file   Social Determinants of Health   Financial Resource Strain: Not on file  Food Insecurity: Not on file  Transportation Needs: Not on file  Physical Activity: Not on file  Stress: Not on file  Social Connections: Not on file  Intimate Partner Violence: Not on file     FAMILY HISTORY:  Family History  Problem Relation Age of Onset   Colon cancer Neg Hx    Esophageal cancer Neg Hx    Rectal cancer Neg Hx    Stomach cancer Neg Hx       REVIEW OF SYSTEMS:  Review of Systems  Constitutional:  Negative for chills and fever.  HENT:  Negative for congestion and sore throat.   Respiratory:  Negative for cough and shortness of breath.   Cardiovascular:  Negative for chest pain and palpitations.  Gastrointestinal:  Positive for abdominal pain (Improved). Negative for nausea and vomiting.  Genitourinary:  Negative for dysuria and urgency.  All other systems reviewed and are negative.   VITAL SIGNS:  Temp:  [99.6 F (37.6 C)] 99.6 F (37.6 C) (06/21 2142) Pulse Rate:  [66-103] 70 (06/22 0800) Resp:  [13-21] 16 (06/22 0800) BP: (109-155)/(67-111) 131/80 (06/22 0800) SpO2:  [90 %-96 %] 93 % (06/22 0800) Weight:  [419 kg] 127 kg (06/21 2142)     Height: '5\' 10"'$  (177.8 cm) Weight: 127 kg BMI (Calculated): 40.18   INTAKE/OUTPUT:  No intake/output data recorded.  PHYSICAL EXAM:  Physical Exam Vitals and nursing note reviewed. Exam conducted with a chaperone present.  Constitutional:      General: He is not in acute distress.    Appearance: Normal appearance. He is obese. He is not ill-appearing.     Comments: Patient resting in bed, NAD. Wife at bedside    HENT:     Head: Normocephalic and atraumatic.     Mouth/Throat:     Mouth: Mucous membranes are moist.     Pharynx: Oropharynx is clear.  Eyes:     General: No scleral icterus.    Conjunctiva/sclera: Conjunctivae normal.  Cardiovascular:     Rate and Rhythm: Normal rate.     Pulses: Normal pulses.     Heart sounds: No murmur heard. Pulmonary:     Effort: Pulmonary effort is normal. No respiratory distress.     Breath sounds: Normal breath sounds.  Abdominal:     General: Abdomen  is protuberant. There is no distension.     Palpations: Abdomen is soft.     Tenderness: There is abdominal tenderness in the right upper quadrant. There is no guarding or rebound. Negative signs include Murphy's sign.     Comments: Abdomen is obese, soft, he has very mild discomfort in the RUQ, no overt tenderness appreciable, non-distended, no rebound/guarding. Negative Murphy's Sign   Genitourinary:    Comments: Deferred Musculoskeletal:     Right lower leg: No edema.     Left lower leg: No edema.  Skin:    General: Skin is warm and dry.     Coloration: Skin is not jaundiced.     Findings: No erythema.  Neurological:     General: No focal deficit present.     Mental Status: He is alert and oriented to person, place, and time.  Psychiatric:        Mood and Affect: Mood normal.        Behavior: Behavior normal.      Labs:     Latest Ref Rng & Units 09/18/2021   10:26 PM 01/05/2018   10:12 AM 12/23/2017    8:39 AM  CBC  WBC 4.0 - 10.5 K/uL 12.4   11.1   Hemoglobin 13.0 - 17.0 g/dL 16.1  13.6  15.0   Hematocrit 39.0 - 52.0 % 48.3  40.0  46.1   Platelets 150 - 400 K/uL 320   255       Latest Ref Rng & Units 09/18/2021   10:26 PM 01/05/2018   10:12 AM 12/23/2017    8:39 AM  CMP  Glucose 70 - 99 mg/dL 115  100  103   BUN 8 - 23 mg/dL 14   16   Creatinine 0.61 - 1.24 mg/dL 0.74   0.87   Sodium 135 - 145 mmol/L 137  139  138   Potassium 3.5 - 5.1 mmol/L 3.7  3.6  3.3   Chloride 98 - 111  mmol/L 102   103   CO2 22 - 32 mmol/L 26   26   Calcium 8.9 - 10.3 mg/dL 10.7   9.7   Total Protein 6.5 - 8.1 g/dL 8.4     Total Bilirubin 0.3 - 1.2 mg/dL 0.8     Alkaline Phos 38 - 126 U/L 35     AST 15 - 41 U/L 32     ALT 0 - 44 U/L 29        Imaging studies:   CTA Chest/Abdomen/Pelvis (09/18/2021) personally reviewed which show cholelithiasis, no gallbladder changes nor pericholecystic fluid, no other intra-abdomina findings to explain pain, and radiologist report reviewed below: IMPRESSION: 1. Negative for acute aortic dissection. Mild aneurysmal dilatation of ascending aorta up to 4.1 cm. Recommend annual imaging followup by CTA or MRA. This recommendation follows 2010 ACCF/AHA/AATS/ACR/ASA/SCA/SCAI/SIR/STS/SVM Guidelines for the Diagnosis and Management of Patients with Thoracic Aortic Disease. Circulation. 2010; 121: A213-Y865. Aortic aneurysm NOS (ICD10-I71.9) 2. Mild to moderate aortic atherosclerosis without aneurysm, dissection, or significant stenosis within the abdomen or pelvis. 3. Gallstone. 4. No CT evidence for acute intra-abdominal or pelvic abnormality.    RUQ [place] (09/19/2021) personally reviewed which showed 17 mm gallstone near BD neck, again there are no evidence of cholecystitis, and radiologist report reviewed below:  IMPRESSION: 1. 17 mm gallstone fixed at the gallbladder neck. No signs of acute cholecystitis. 2. Hepatic steatosis.   Assessment/Plan: (ICD-10's: K88.20) 66 y.o. male presenting to ED with right upper abdominal pain without  clear etiology, incidentally found to have large gallstone near gallbladder neck without evidence of cholecystitis.   - Etiology of upper abdominal pain not clear cut, but this could certainly be secondary to incidentally found gallstone (ex: symptomatic cholelithiasis). Fortunately, his examination is reassuring and there is no evidence of cholecystitis on imaging. I do not think he warrants any emergent surgical  intervention. Will go ahead and obtain HIDA scan to definitively rule in/out his gallbladder as source of his symptoms.   - He understands that if the HIDA scan is positive, he will likely benefit from laparoscopic cholecystectomy. I reviewed proceed with the patient and his wife at their request  - NPO for planned HIDA - No indication for IV Abx currently  - Pain control prn (hold narcotics for HIDA, last given at 2300 on 60/21)  - Antiemetics prn  - Further management per primary service; we will follow   All of the above findings and recommendations were discussed with the patient and his family (wife at bedside), and all of their questions were answered to their expressed satisfaction.  Thank you for the opportunity to participate in this patient's care.   -- Edison Simon, PA-C [facility] 09/19/2021, 9:54 AM M-F: 7am - 4pm

## 2021-09-19 NOTE — Assessment & Plan Note (Addendum)
BMI 40.18.  Complicating factor to overall prognosis and care

## 2021-09-19 NOTE — Progress Notes (Signed)
Pt does not want to use one of our CPAP's tonight. Pt stated he would have family bring his in from home tomorrow if needed. Pt aware that a CPAP will be made available if he changes his mind.

## 2021-09-19 NOTE — Assessment & Plan Note (Addendum)
Continue gabapentin.

## 2021-09-19 NOTE — Assessment & Plan Note (Deleted)
Suspect related to sleep apnea Supplemental oxygen to keep sats over 92

## 2021-09-19 NOTE — Progress Notes (Signed)
Progress Note   Patient: Rodney Clark IEP:[id number] DOB: 06-Dec-1955 DOA: 09/18/2021     0 DOS: the patient was seen and examined on 09/19/2021     Assessment and Plan: * Upper abdominal pain Case discussed with general surgery and gastroenterology.  Since liver function test are normal no need for MRCP or ERCP at this point.  General surgery saw the patient and ordered a HIDA scan to see if the gallbladder is dysfunctional.  We will recheck liver function test tomorrow.  The patient on ultrasound does have a stone near the gallbladder neck.  Cholelithiasis HIDA scan ordered  Thoracic aortic aneurysm without rupture (HCC) 4.1 cm aneurysmal dilation.  Recommend yearly imaging.   Polyradiculitis Continue gabapentin  Obesity, Class III, BMI 40-49.9 (morbid obesity) (HCC) BMI 40.18.  Complicating factor to overall prognosis and care  Essential hypertension Continue home hydrochlorothiazide and losartan  OSA (obstructive sleep apnea) CPAP nightly        Subjective: Patient complaining of pain in his right back that radiated around to his right abdomen into the epigastric area.  Patient does have some heartburn and he does take Pepcid at home.  Patient's cardiac enzymes were negative and CT scan of the chest did not show any aortic dissection.  Right upper quadrant ultrasound showed a 17 mm gallstone fixed in the gallbladder neck.  Physical Exam: Vitals:   09/19/21 0530 09/19/21 0600 09/19/21 0700 09/19/21 0800  BP: 127/77 109/67 118/72 131/80  Pulse: 75 86 84 70  Resp: 17 (!) '21 17 16  '$ Temp:      TempSrc:      SpO2: 96% 91% 94% 93%  Weight:      Height:       Physical Exam HENT:     Head: Normocephalic.     Mouth/Throat:     Pharynx: No oropharyngeal exudate.  Eyes:     General: Lids are normal.     Conjunctiva/sclera: Conjunctivae normal.  Cardiovascular:     Rate and Rhythm: Normal rate and regular rhythm.     Heart sounds: Normal heart sounds, S1 normal and  S2 normal.  Pulmonary:     Breath sounds: No decreased breath sounds, wheezing, rhonchi or rales.  Abdominal:     Palpations: Abdomen is soft.     Tenderness: There is abdominal tenderness in the right upper quadrant.  Musculoskeletal:     Right lower leg: Swelling present.     Left lower leg: Swelling present.  Skin:    General: Skin is warm.     Findings: No rash.  Neurological:     Mental Status: He is alert and oriented to person, place, and time.     Data Reviewed: Bilirubin 0.8, AST 32, ALT 29, lipase 39, glucose 115, high-sensitivity troponin 4 and repeat is 5, white blood cell count 12.4, hemoglobin 16.1, platelet count 320 CT angio of the chest showed negative for any acute or aortic dissection but did show aneurysmal dilation of ascending aorta up to 4.1 cm recommend annual imaging Right upper quadrant ultrasound showed 17 mm gallstone fixed at the gallbladder neck no signs of acute cholecystitis and hepatic steatosis  Family Communication: Spoke with wife on the phone  Disposition: Status is: Observation The patient remains OBS appropriate and will d/c before 2 midnights.  Patient receiving a HIDA scan and surgical evaluation about potential gallbladder cause of patient's pain.  Planned Discharge Destination: Home   Author: Loletha Grayer, MD 09/19/2021 1:25 PM  For on call  review [email or web address].

## 2021-09-19 NOTE — Progress Notes (Signed)
Anticoagulation monitoring(Lovenox):  66 yo male ordered Lovenox 40 mg Q24h    Filed Weights   09/18/21 2142  Weight: 127 kg (280 lb)   BMI 40.18   Lab Results  Component Value Date   CREATININE 0.74 09/18/2021   CREATININE 0.87 12/23/2017   CREATININE 1.13 11/05/2013   Estimated Creatinine Clearance: 121.5 mL/min (by C-G formula based on SCr of 0.74 mg/dL). Hemoglobin & Hematocrit     Component Value Date/Time   HGB 16.1 09/18/2021 2226   HGB 16.1 11/05/2013 1930   HCT 48.3 09/18/2021 2226   HCT 47.3 11/05/2013 1930     Per Protocol for Patient with estCrcl > 30 ml/min and BMI > 30, will transition to Lovenox 62.5 mg Q24h.

## 2021-09-19 NOTE — Assessment & Plan Note (Addendum)
HIDA scan ordered 

## 2021-09-19 NOTE — Assessment & Plan Note (Addendum)
Case discussed with general surgery and gastroenterology.  Since liver function test are normal no need for MRCP or ERCP at this point.  General surgery saw the patient and ordered a HIDA scan to see if the gallbladder is dysfunctional.  We will recheck liver function test tomorrow.  The patient on ultrasound does have a stone near the gallbladder neck.

## 2021-09-19 NOTE — ED Notes (Signed)
Patient to nuc med at this time.

## 2021-09-19 NOTE — Progress Notes (Signed)
HIDA scan came back negative  We will give IV Protonix and reevaluate tomorrow morning.  If still not feeling well may get an endoscopy.  Patient's back pain is lower at the right sacroiliac area.  We will give 1 dose of Solu-Medrol today.  Dr. Loletha Grayer

## 2021-09-19 NOTE — Assessment & Plan Note (Addendum)
4.1 cm aneurysmal dilation.  Recommend yearly imaging.

## 2021-09-20 DIAGNOSIS — G4733 Obstructive sleep apnea (adult) (pediatric): Secondary | ICD-10-CM | POA: Diagnosis not present

## 2021-09-20 DIAGNOSIS — I7121 Aneurysm of the ascending aorta, without rupture: Secondary | ICD-10-CM | POA: Diagnosis not present

## 2021-09-20 DIAGNOSIS — M461 Sacroiliitis, not elsewhere classified: Secondary | ICD-10-CM | POA: Diagnosis not present

## 2021-09-20 DIAGNOSIS — R1011 Right upper quadrant pain: Secondary | ICD-10-CM

## 2021-09-20 DIAGNOSIS — K8020 Calculus of gallbladder without cholecystitis without obstruction: Secondary | ICD-10-CM | POA: Diagnosis not present

## 2021-09-20 DIAGNOSIS — I10 Essential (primary) hypertension: Secondary | ICD-10-CM | POA: Diagnosis not present

## 2021-09-20 DIAGNOSIS — G629 Polyneuropathy, unspecified: Secondary | ICD-10-CM | POA: Diagnosis not present

## 2021-09-20 LAB — COMPREHENSIVE METABOLIC PANEL
ALT: 25 U/L (ref 0–44)
AST: 28 U/L (ref 15–41)
Albumin: 4.1 g/dL (ref 3.5–5.0)
Alkaline Phosphatase: 33 U/L — ABNORMAL LOW (ref 38–126)
Anion gap: 4 — ABNORMAL LOW (ref 5–15)
BUN: 18 mg/dL (ref 8–23)
CO2: 29 mmol/L (ref 22–32)
Calcium: 9.5 mg/dL (ref 8.9–10.3)
Chloride: 104 mmol/L (ref 98–111)
Creatinine, Ser: 0.87 mg/dL (ref 0.61–1.24)
GFR, Estimated: >60 mL/min (ref >60)
Glucose, Bld: 142 mg/dL — ABNORMAL HIGH (ref 70–99)
Potassium: 4 mmol/L (ref 3.5–5.1)
Sodium: 137 mmol/L (ref 135–145)
Total Bilirubin: 0.7 mg/dL (ref 0.3–1.2)
Total Protein: 7.8 g/dL (ref 6.5–8.1)

## 2021-09-20 LAB — CBC
HCT: 43.4 % (ref 39.0–52.0)
Hemoglobin: 14.6 g/dL (ref 13.0–17.0)
MCH: 29.3 pg (ref 26.0–34.0)
MCHC: 33.6 g/dL (ref 30.0–36.0)
MCV: 87.1 fL (ref 80.0–100.0)
Platelets: 309 10*3/uL (ref 150–400)
RBC: 4.98 MIL/uL (ref 4.22–5.81)
RDW: 12.1 % (ref 11.5–15.5)
WBC: 11.7 10*3/uL — ABNORMAL HIGH (ref 4.0–10.5)
nRBC: 0 % (ref 0.0–0.2)

## 2021-09-20 MED ORDER — PANTOPRAZOLE SODIUM 40 MG PO TBEC: 40.0000 mg | DELAYED_RELEASE_TABLET | Freq: Every day | ORAL | 0 refills | Status: DC

## 2021-09-20 MED ORDER — PREDNISONE 10 MG PO TABS: ORAL_TABLET | ORAL | 0 refills | Status: DC

## 2021-09-20 MED ORDER — GABAPENTIN 300 MG PO CAPS: 300.0000 mg | ORAL_CAPSULE | Freq: Every day | ORAL | Status: DC

## 2021-09-20 MED ORDER — GABAPENTIN 300 MG PO CAPS: 600.0000 mg | ORAL_CAPSULE | Freq: Two times a day (BID) | ORAL | Status: DC

## 2021-09-20 MED ORDER — PREDNISONE 10 MG PO TABS
10.0000 mg | ORAL_TABLET | Freq: Every day | ORAL | Status: DC
  Administered 2021-09-20: 10 mg via ORAL
  Filled 2021-09-20: qty 1

## 2021-09-21 LAB — PARATHYROID HORMONE, INTACT (NO CA): PTH: 19 pg/mL (ref 15–65)

## 2021-09-25 DIAGNOSIS — G4733 Obstructive sleep apnea (adult) (pediatric): Secondary | ICD-10-CM | POA: Diagnosis not present

## 2021-09-25 DIAGNOSIS — I7121 Aneurysm of the ascending aorta, without rupture: Secondary | ICD-10-CM | POA: Diagnosis not present

## 2021-09-25 DIAGNOSIS — K8020 Calculus of gallbladder without cholecystitis without obstruction: Secondary | ICD-10-CM | POA: Diagnosis not present

## 2021-09-25 DIAGNOSIS — I2510 Atherosclerotic heart disease of native coronary artery without angina pectoris: Secondary | ICD-10-CM | POA: Diagnosis not present

## 2021-09-25 DIAGNOSIS — M5412 Radiculopathy, cervical region: Secondary | ICD-10-CM | POA: Diagnosis not present

## 2021-09-25 DIAGNOSIS — R7303 Prediabetes: Secondary | ICD-10-CM | POA: Diagnosis not present

## 2021-09-25 DIAGNOSIS — I10 Essential (primary) hypertension: Secondary | ICD-10-CM | POA: Diagnosis not present

## 2021-09-25 DIAGNOSIS — K219 Gastro-esophageal reflux disease without esophagitis: Secondary | ICD-10-CM | POA: Diagnosis not present

## 2021-09-25 DIAGNOSIS — R1011 Right upper quadrant pain: Secondary | ICD-10-CM | POA: Diagnosis not present

## 2021-09-26 ENCOUNTER — Ambulatory Visit
Admission: RE | Admit: 2021-09-26 | Discharge: 2021-09-26 | Disposition: A | Discharge status: Home / Self Care | Payer: PPO | Source: Ambulatory Visit | Attending: Student | Admitting: Student

## 2021-09-26 ENCOUNTER — Telehealth: Payer: Self-pay

## 2021-09-26 DIAGNOSIS — M4317 Spondylolisthesis, lumbosacral region: Secondary | ICD-10-CM | POA: Diagnosis not present

## 2021-09-26 DIAGNOSIS — R531 Weakness: Secondary | ICD-10-CM | POA: Diagnosis not present

## 2021-09-26 DIAGNOSIS — M25551 Pain in right hip: Secondary | ICD-10-CM | POA: Diagnosis not present

## 2021-09-26 DIAGNOSIS — R200 Anesthesia of skin: Secondary | ICD-10-CM | POA: Diagnosis not present

## 2021-09-26 MED ORDER — GADOBENATE DIMEGLUMINE 529 MG/ML IV SOLN
20.0000 mL | Freq: Once | INTRAVENOUS | Status: AC | PRN
  Administered 2021-09-26: 20 mL via INTRAVENOUS

## 2021-09-26 NOTE — Telephone Encounter (Signed)
Mychart, Generic  Rodney Clark "Rodney Clark" 20 hours ago (12:30 PM)   GM Appointment Information:     Visit Type: New Patient Appointment         Date: 10/02/2021                 Dept: [facility]                 Provider: Jonathon Bellows                 Time: 2:00 PM                 Length: 30 min   Appt Status: Canceled       Cancel Reason: Feeling Better

## 2021-10-02 ENCOUNTER — Ambulatory Visit: Payer: PPO | Admitting: Gastroenterology

## 2021-10-03 ENCOUNTER — Other Ambulatory Visit: Payer: PPO

## 2021-10-03 DIAGNOSIS — M5030 Other cervical disc degeneration, unspecified cervical region: Secondary | ICD-10-CM | POA: Diagnosis not present

## 2021-10-03 DIAGNOSIS — Z9889 Other specified postprocedural states: Secondary | ICD-10-CM | POA: Diagnosis not present

## 2021-10-03 DIAGNOSIS — M4802 Spinal stenosis, cervical region: Secondary | ICD-10-CM | POA: Diagnosis not present

## 2021-10-03 DIAGNOSIS — M47816 Spondylosis without myelopathy or radiculopathy, lumbar region: Secondary | ICD-10-CM | POA: Diagnosis not present

## 2021-10-03 DIAGNOSIS — M5416 Radiculopathy, lumbar region: Secondary | ICD-10-CM | POA: Diagnosis not present

## 2021-10-03 DIAGNOSIS — M48062 Spinal stenosis, lumbar region with neurogenic claudication: Secondary | ICD-10-CM | POA: Diagnosis not present

## 2021-10-03 DIAGNOSIS — M5136 Other intervertebral disc degeneration, lumbar region: Secondary | ICD-10-CM | POA: Diagnosis not present

## 2021-10-03 DIAGNOSIS — M1611 Unilateral primary osteoarthritis, right hip: Secondary | ICD-10-CM | POA: Diagnosis not present

## 2021-10-03 DIAGNOSIS — M5412 Radiculopathy, cervical region: Secondary | ICD-10-CM | POA: Diagnosis not present

## 2021-10-04 ENCOUNTER — Inpatient Hospital Stay: Payer: PPO | Admitting: Surgery

## 2021-10-04 DIAGNOSIS — M5412 Radiculopathy, cervical region: Secondary | ICD-10-CM | POA: Diagnosis not present

## 2021-10-04 DIAGNOSIS — M4802 Spinal stenosis, cervical region: Secondary | ICD-10-CM | POA: Diagnosis not present

## 2021-10-11 DIAGNOSIS — R739 Hyperglycemia, unspecified: Secondary | ICD-10-CM | POA: Diagnosis not present

## 2021-10-11 DIAGNOSIS — K219 Gastro-esophageal reflux disease without esophagitis: Secondary | ICD-10-CM | POA: Diagnosis not present

## 2021-10-11 DIAGNOSIS — M519 Unspecified thoracic, thoracolumbar and lumbosacral intervertebral disc disorder: Secondary | ICD-10-CM | POA: Diagnosis not present

## 2021-10-11 DIAGNOSIS — E7849 Other hyperlipidemia: Secondary | ICD-10-CM | POA: Diagnosis not present

## 2021-10-11 DIAGNOSIS — I10 Essential (primary) hypertension: Secondary | ICD-10-CM | POA: Diagnosis not present

## 2021-10-11 DIAGNOSIS — I2510 Atherosclerotic heart disease of native coronary artery without angina pectoris: Secondary | ICD-10-CM | POA: Diagnosis not present

## 2021-10-18 DIAGNOSIS — G4733 Obstructive sleep apnea (adult) (pediatric): Secondary | ICD-10-CM | POA: Diagnosis not present

## 2021-10-18 DIAGNOSIS — M519 Unspecified thoracic, thoracolumbar and lumbosacral intervertebral disc disorder: Secondary | ICD-10-CM | POA: Diagnosis not present

## 2021-10-18 DIAGNOSIS — I7121 Aneurysm of the ascending aorta, without rupture: Secondary | ICD-10-CM | POA: Diagnosis not present

## 2021-10-18 DIAGNOSIS — M5090 Cervical disc disorder, unspecified, unspecified cervical region: Secondary | ICD-10-CM | POA: Diagnosis not present

## 2021-10-18 DIAGNOSIS — R7303 Prediabetes: Secondary | ICD-10-CM | POA: Diagnosis not present

## 2021-10-18 DIAGNOSIS — E7849 Other hyperlipidemia: Secondary | ICD-10-CM | POA: Diagnosis not present

## 2021-10-18 DIAGNOSIS — Z6839 Body mass index (BMI) 39.0-39.9, adult: Secondary | ICD-10-CM | POA: Diagnosis not present

## 2021-10-18 DIAGNOSIS — I10 Essential (primary) hypertension: Secondary | ICD-10-CM | POA: Diagnosis not present

## 2021-10-18 DIAGNOSIS — M5412 Radiculopathy, cervical region: Secondary | ICD-10-CM | POA: Diagnosis not present

## 2021-10-18 DIAGNOSIS — Z125 Encounter for screening for malignant neoplasm of prostate: Secondary | ICD-10-CM | POA: Diagnosis not present

## 2021-10-18 DIAGNOSIS — K219 Gastro-esophageal reflux disease without esophagitis: Secondary | ICD-10-CM | POA: Diagnosis not present

## 2021-10-21 ENCOUNTER — Other Ambulatory Visit: Payer: Self-pay | Admitting: Neurosurgery

## 2021-11-04 DIAGNOSIS — M5136 Other intervertebral disc degeneration, lumbar region: Secondary | ICD-10-CM | POA: Diagnosis not present

## 2021-11-04 DIAGNOSIS — M5412 Radiculopathy, cervical region: Secondary | ICD-10-CM | POA: Diagnosis not present

## 2021-11-04 DIAGNOSIS — M5416 Radiculopathy, lumbar region: Secondary | ICD-10-CM | POA: Diagnosis not present

## 2021-11-04 DIAGNOSIS — M5030 Other cervical disc degeneration, unspecified cervical region: Secondary | ICD-10-CM | POA: Diagnosis not present

## 2021-11-19 DIAGNOSIS — M1711 Unilateral primary osteoarthritis, right knee: Secondary | ICD-10-CM | POA: Diagnosis not present

## 2021-11-27 DIAGNOSIS — M5412 Radiculopathy, cervical region: Secondary | ICD-10-CM | POA: Diagnosis not present

## 2021-12-17 NOTE — Progress Notes (Signed)
Surgical Instructions    Your procedure is scheduled on Tuesday September 26th.  Report to [facility] Main Entrance "A" at 6 A.M., then check in with the Admitting office.  Call this number if you have problems the morning of surgery:  [phone]   If you have any questions prior to your surgery date call [phone]: Open Monday-Friday 8am-4pm    Remember:  Do not eat or drink after midnight the night before your surgery     Take these medicines the morning of surgery with A SIP OF WATER: famotidine (PEPCID) 20 MG tablet fenofibrate 160 MG tablet fexofenadine (ALLEGRA) 180 MG tablet predniSONE (DELTASONE) 10 MG tablet (if still taking)  IF NEEDED  tiZANidine (ZANAFLEX) 4 MG tablet traMADol (ULTRAM) 50 MG tablet    As of today, STOP taking any Aspirin (unless otherwise instructed by your surgeon) Aleve, Naproxen, Ibuprofen, Motrin, Advil, Goody's, BC's, all herbal medications, fish oil, and all vitamins.           Do not wear jewelry  Do not wear lotions, powders, cologne or deodorant. Do not shave 48 hours prior to surgery.  Men may shave face and neck. Do not bring valuables to the hospital. Do not wear nail polish, gel polish, artificial nails, or any other type of covering on natural nails (fingers and toes) If you have artificial nails   [facility] is not responsible for any belongings or valuables.    Do NOT Smoke (Tobacco/Vaping)  24 hours prior to your procedure  If you use a CPAP at night, you may bring your mask for your overnight stay.   Contacts, glasses, hearing aids, dentures or partials may not be worn into surgery, please bring cases for these belongings   For patients admitted to the hospital, discharge time will be determined by your treatment team.   Patients discharged the day of surgery will not be allowed to drive home, and someone needs to stay with them for 24 hours.   SURGICAL WAITING ROOM VISITATION Patients having surgery or a  procedure may have no more than 2 support people in the waiting area - these visitors may rotate.   Children under the age of 5 must have an adult with them who is not the patient. If the patient needs to stay at the hospital during part of their recovery, the visitor guidelines for inpatient rooms apply. Pre-op nurse will coordinate an appropriate time for 1 support person to accompany patient in pre-op.  This support person may not rotate.   Please refer to the [facility] website for the visitor guidelines for Inpatients (after your surgery is over and you are in a regular room).    Special instructions:    Oral Hygiene is also important to reduce your risk of infection.  Remember - BRUSH YOUR TEETH THE MORNING OF SURGERY WITH YOUR REGULAR TOOTHPASTE   Rodney Clark- Preparing For Surgery  Before surgery, you can play an important role. Because skin is not sterile, your skin needs to be as free of germs as possible. You can reduce the number of germs on your skin by washing with CHG (chlorahexidine gluconate) Soap before surgery.  CHG is an antiseptic cleaner which kills germs and bonds with the skin to continue killing germs even after washing.     Please do not use if you have an allergy to CHG or antibacterial soaps. If your skin becomes reddened/irritated stop using the CHG.  Do not shave (including legs and underarms) for at  least 48 hours prior to first CHG shower. It is OK to shave your face.  Please follow these instructions carefully.     Shower the NIGHT BEFORE SURGERY and the MORNING OF SURGERY with CHG Soap.   If you chose to wash your hair, wash your hair first as usual with your normal shampoo. After you shampoo, rinse your hair and body thoroughly to remove the shampoo.  Then [employer] and genitals (private parts) with your normal soap and rinse thoroughly to remove soap.  After that Use CHG Soap as you would any other liquid soap. You can apply CHG directly to the skin and  wash gently with a scrungie or a clean washcloth.   Apply the CHG Soap to your body ONLY FROM THE NECK DOWN.  Do not use on open wounds or open sores. Avoid contact with your eyes, ears, mouth and genitals (private parts). Wash Face and genitals (private parts)  with your normal soap.   Wash thoroughly, paying special attention to the area where your surgery will be performed.  Thoroughly rinse your body with warm water from the neck down.  DO NOT shower/wash with your normal soap after using and rinsing off the CHG Soap.  Pat yourself dry with a CLEAN TOWEL.  Wear CLEAN PAJAMAS to bed the night before surgery  Place CLEAN SHEETS on your bed the night before your surgery  DO NOT SLEEP WITH PETS.   Day of Surgery:  Take a shower with CHG soap. Wear Clean/Comfortable clothing the morning of surgery Do not apply any deodorants/lotions.   Remember to brush your teeth WITH YOUR REGULAR TOOTHPASTE.    If you received a COVID test during your pre-op visit, it is requested that you wear a mask when out in public, stay away from anyone that may not be feeling well, and notify your surgeon if you develop symptoms. If you have been in contact with anyone that has tested positive in the last 10 days, please notify your surgeon.    Please read over the following fact sheets that you were given.

## 2021-12-18 ENCOUNTER — Encounter (HOSPITAL_COMMUNITY)
Admission: RE | Admit: 2021-12-18 | Discharge: 2021-12-18 | Disposition: A | Discharge status: Home / Self Care | Payer: PPO | Source: Ambulatory Visit | Attending: Neurosurgery | Admitting: Neurosurgery

## 2021-12-18 ENCOUNTER — Other Ambulatory Visit: Payer: Self-pay

## 2021-12-18 ENCOUNTER — Encounter (HOSPITAL_COMMUNITY): Payer: Self-pay

## 2021-12-18 VITALS — BP 152/76 | HR 59 | Temp 98.1°F | Resp 18 | Ht 70.0 in | Wt 291.0 lb

## 2021-12-18 DIAGNOSIS — Z01812 Encounter for preprocedural laboratory examination: Secondary | ICD-10-CM | POA: Diagnosis not present

## 2021-12-18 DIAGNOSIS — I447 Left bundle-branch block, unspecified: Secondary | ICD-10-CM | POA: Insufficient documentation

## 2021-12-18 DIAGNOSIS — Z85828 Personal history of other malignant neoplasm of skin: Secondary | ICD-10-CM | POA: Diagnosis not present

## 2021-12-18 DIAGNOSIS — Z01818 Encounter for other preprocedural examination: Secondary | ICD-10-CM

## 2021-12-18 DIAGNOSIS — E669 Obesity, unspecified: Secondary | ICD-10-CM | POA: Diagnosis not present

## 2021-12-18 DIAGNOSIS — I10 Essential (primary) hypertension: Secondary | ICD-10-CM | POA: Diagnosis not present

## 2021-12-18 DIAGNOSIS — Z6841 Body Mass Index (BMI) 40.0 and over, adult: Secondary | ICD-10-CM | POA: Insufficient documentation

## 2021-12-18 DIAGNOSIS — K219 Gastro-esophageal reflux disease without esophagitis: Secondary | ICD-10-CM | POA: Insufficient documentation

## 2021-12-18 DIAGNOSIS — I712 Thoracic aortic aneurysm, without rupture, unspecified: Secondary | ICD-10-CM | POA: Insufficient documentation

## 2021-12-18 DIAGNOSIS — R0789 Other chest pain: Secondary | ICD-10-CM | POA: Diagnosis not present

## 2021-12-18 DIAGNOSIS — E785 Hyperlipidemia, unspecified: Secondary | ICD-10-CM | POA: Insufficient documentation

## 2021-12-18 DIAGNOSIS — G4733 Obstructive sleep apnea (adult) (pediatric): Secondary | ICD-10-CM | POA: Insufficient documentation

## 2021-12-18 DIAGNOSIS — R739 Hyperglycemia, unspecified: Secondary | ICD-10-CM | POA: Diagnosis not present

## 2021-12-18 HISTORY — DX: Thoracic aortic aneurysm, without rupture, unspecified: I71.20

## 2021-12-18 LAB — CBC
HCT: 48.2 % (ref 39.0–52.0)
Hemoglobin: 15.9 g/dL (ref 13.0–17.0)
MCH: 29.3 pg (ref 26.0–34.0)
MCHC: 33 g/dL (ref 30.0–36.0)
MCV: 88.8 fL (ref 80.0–100.0)
Platelets: 259 10*3/uL (ref 150–400)
RBC: 5.43 MIL/uL (ref 4.22–5.81)
RDW: 12.7 % (ref 11.5–15.5)
WBC: 7.7 10*3/uL (ref 4.0–10.5)
nRBC: 0 % (ref 0.0–0.2)

## 2021-12-18 LAB — BASIC METABOLIC PANEL
Anion gap: 8 (ref 5–15)
BUN: 15 mg/dL (ref 8–23)
CO2: 26 mmol/L (ref 22–32)
Calcium: 9.5 mg/dL (ref 8.9–10.3)
Chloride: 104 mmol/L (ref 98–111)
Creatinine, Ser: 0.93 mg/dL (ref 0.61–1.24)
GFR, Estimated: >60 mL/min (ref >60)
Glucose, Bld: 120 mg/dL — ABNORMAL HIGH (ref 70–99)
Potassium: 3.9 mmol/L (ref 3.5–5.1)
Sodium: 138 mmol/L (ref 135–145)

## 2021-12-18 LAB — SURGICAL PCR SCREEN
MRSA, PCR: NEGATIVE
Staphylococcus aureus: NEGATIVE

## 2021-12-18 NOTE — Progress Notes (Signed)
PCP - Dr. Ramonita Lab Cardiologist - denies. Patient states Dr. Serafina Royals read the report for his stress test that was done in July but he has never seen a Cardiologist.   PPM/ICD - n/a Device Orders - n/a Rep Notified - n/a  Chest x-ray - n/a EKG - 09/18/21 Stress Test - 10/11/21. Care Everywhere ECHO - denies Cardiac Cath - denies  Sleep Study - +OSA. Wears CPAP nightly. Does not know pressure settings.   Blood Thinner Instructions: n/a Aspirin Instructions: n/a  ERAS Protcol - NPO  COVID TEST- n/a   Anesthesia review: Yes. Recent hospitalization at [facility] 08/2021 for epigastric pain. Gallstone detected per patient.   Patient denies shortness of breath, fever, cough and chest pain at PAT appointment   All instructions explained to the patient, with a verbal understanding of the material. Patient agrees to go over the instructions while at home for a better understanding. The opportunity to ask questions was provided.

## 2021-12-19 NOTE — Anesthesia Preprocedure Evaluation (Addendum)
Anesthesia Evaluation  Patient identified by MRN, date of birth, ID band Patient awake    Reviewed: Allergy & Precautions, NPO status , Patient's Chart, lab work & pertinent test results  Airway Mallampati: IV  TM Distance: >3 FB Neck ROM: Limited    Dental  (+) Teeth Intact, Dental Advisory Given   Pulmonary sleep apnea and Continuous Positive Airway Pressure Ventilation ,    Pulmonary exam normal        Cardiovascular hypertension,  Rhythm:Regular Rate:Normal  - Thoracic Aneurysm   Neuro/Psych  Neuromuscular disease negative psych ROS   GI/Hepatic Neg liver ROS, GERD  ,  Endo/Other  negative endocrine ROS  Renal/GU negative Renal ROS     Musculoskeletal  (+) Arthritis ,   Abdominal Normal abdominal exam  (+)   Peds  Hematology negative hematology ROS (+)   Anesthesia Other Findings   Reproductive/Obstetrics                          Anesthesia Physical Anesthesia Plan  ASA: 3  Anesthesia Plan: General   Post-op Pain Management:    Induction: Intravenous  PONV Risk Score and Plan: 3 and Ondansetron, Dexamethasone and Midazolam  Airway Management Planned: Oral ETT and Video Laryngoscope Planned  Additional Equipment: None  Intra-op Plan:   Post-operative Plan: Extubation in OR  Informed Consent: I have reviewed the patients History and Physical, chart, labs and discussed the procedure including the risks, benefits and alternatives for the proposed anesthesia with the patient or authorized representative who has indicated his/her understanding and acceptance.     Dental advisory given  Plan Discussed with: CRNA  Anesthesia Plan Comments: (PAT note written 12/19/2021 by Myra Gianotti, PA-C. )      Anesthesia Quick Evaluation

## 2021-12-19 NOTE — Progress Notes (Signed)
Anesthesia Chart Review:  Case: [id number] Date/Time: 12/24/21 0745   Procedure: ACDF C5-C6 - C6-C7 - 3C   Anesthesia type: General   Pre-op diagnosis: Stenosis   Location: [facility] OR ROOM 34 / [place] OR   Surgeons: Earnie Larsson, MD       DISCUSSION: Patient is a 66 year old male scheduled for the above procedure.  History includes never smoker, HTN, HLD, thoracic aortic aneurysm (4.1 cm), hyperglycemia (A1c 5.9% 10/11/21), GERD, atypical chest pain (with history of incomplete LBBB; non-ischemic stress tests 10/28/12 & 10/11/21), OSA (uses CPAP), skin cancer (SCC), gout, spinal surgery (L5-S1 PLIF/posterolateral arthrodesis 01/05/18). BMI is consistent with obesity.   admission 09/18/21-09/20/21 for upper abdominal pain. Cardiac enzymes negative. CTA of the chest showed incidental extensive coronary calcifications and a 4.1 cm ascending thoracic aortic aneurysm with annual imaging recommended. US showed hepatic steatosis and a 17 mm gallstone without signs of acute cholecystitis. HIDA scan was unremarkable. He was treated with Protonix and given prednisone for back pain/right sacroiliitis. He did have PCP follow-up with Dr. Ramonita Lab on 09/25/21 who ordered a stress test given his epigastric symptoms and coronary calcifications. 10/11/21 Nuclear stress test was non-ischemic, EF 58%.  Anesthesia team to evaluate on the day of surgery.    VS: BP (!) 152/76   Pulse (!) 59   Temp 36.7 C   Resp 18   Ht '5\' 10"'$  (1.778 m)   Wt 132 kg   SpO2 96%   BMI 41.75 kg/m   PROVIDERS: Adin Hector, MD is PCP    LABS: Labs reviewed: Acceptable for surgery. A1c 5.9% 10/11/21 ([facility]). (all labs ordered are listed, but only abnormal results are displayed)  Labs Reviewed  BASIC METABOLIC PANEL - Abnormal; Notable for the following components:      Result Value   Glucose, Bld 120 (*)    All other components within normal limits  SURGICAL PCR SCREEN  CBC     IMAGES: MRI L-spine  09/26/21: IMPRESSION: 1. Overall, no significant interval change since 06/10/2020. 2. Stable postsurgical changes reflecting posterior fusion and decompression at L5-S1 with unchanged grade 1 anterolisthesis and moderate right and mild to moderate left neural foraminal stenosis. 3. Adjacent segment disease at L4-L5 resulting in severe right and moderate left neural foraminal stenosis. Degenerative endplate marrow signal abnormality with edema at this level is not significantly changed. 4. Moderate left neural foraminal stenosis at L2-L3 and moderate left worse than right neural foraminal stenosis at L3-L4, unchanged.   CTA Chest/abd/pelvis 09/18/21: IMPRESSION: 1. Negative for acute aortic dissection. Mild aneurysmal dilatation of ascending aorta up to 4.1 cm. Recommend annual imaging followup by CTA or MRA. This recommendation follows 2010 ACCF/AHA/AATS/ACR/ASA/SCA/SCAI/SIR/STS/SVM Guidelines for the Diagnosis and Management of Patients with Thoracic Aortic Disease. Circulation. 2010; 121: X528-U132. Aortic aneurysm NOS (ICD10-I71.9) 2. Mild to moderate aortic atherosclerosis without aneurysm, dissection, or significant stenosis within the abdomen or pelvis. 3. Gallstone. 4. No CT evidence for acute intra-abdominal or pelvic abnormality.   MRI C-spine 08/1521: IMPRESSION: 1. Cervical spine degeneration with notable progression at C5-6 and C6-7 since 2021. Discogenic endplate edema is present at C6-7. 2. Up to mild spinal stenosis at C5-6. 3. Left foraminal impingement at C6-7. 4. C3-4 ankylosis.      EKG: 09/18/21: Sinus rhythm Left axis deviation Anterior infarct, old - He has known history of at least incomplete left BBB on tracings dating back to 12/23/17 and 06/11/01 (MUSE).   CV: Nuclear stress test 10/11/21 [facility]  [facility]):  IMPRESSION: Normal Lexiscan infusion EKG. LVEF 58%. Normal myocardial perfusion without evidence of myocardial ischemia    Echo 10/28/12: Summary:  1.  Left ventricular ejection fraction, by visual estimation, is 70 to  75%.  2. Normal global left ventricular systolic function.  3. Mild mitral valve regurgitation.  4. Mild aortic valve sclerosis without stenosis.  5. Moderately increased left ventricular posterior wall thickness.  6. Mild tricuspid regurgitation.    Past Medical History:  Diagnosis Date   Actinic keratosis    Allergic state    Allergy    Arthritis    Atypical chest pain    GERD (gastroesophageal reflux disease)    Gout    Hyperglycemia    Hyperlipidemia    Hypertension    Neuromuscular disorder (HCC)    nerve damage right leg   Numbness of toes    Sleep apnea    wears CPAP   Squamous cell carcinoma of skin 11/16/2018   left nasal supratip (Dr. Evorn Gong). Tx: LN2, 5FU/Calcipotriene cream   Squamous cell carcinoma of skin    L temple, txted yrs ago   Thoracic aortic aneurysm [facility])     Past Surgical History:  Procedure Laterality Date   APPENDECTOMY     BACK SURGERY  01/05/2018   CATARACT EXTRACTION Bilateral    COLONOSCOPY N/A 05/14/2015   Procedure: COLONOSCOPY;  Surgeon: Manya Silvas, MD;  Location: [place];  Service: Endoscopy;  Laterality: N/A;   COLONOSCOPY     HERNIA REPAIR     x2   ROTATOR CUFF REPAIR Right    TONSILLECTOMY      MEDICATIONS:  famotidine (PEPCID) 20 MG tablet   fenofibrate 160 MG tablet   fexofenadine (ALLEGRA) 180 MG tablet   gabapentin (NEURONTIN) 300 MG capsule   gabapentin (NEURONTIN) 300 MG capsule   hydrochlorothiazide (HYDRODIURIL) 12.5 MG tablet   losartan (COZAAR) 50 MG tablet   Menthol, Topical Analgesic, (BIOFREEZE) 10 % CREA   NON FORMULARY   pantoprazole (PROTONIX) 40 MG tablet   pravastatin (PRAVACHOL) 10 MG tablet   predniSONE (DELTASONE) 10 MG tablet   tiZANidine (ZANAFLEX) 4 MG tablet   traMADol (ULTRAM) 50 MG tablet   No current facility-administered medications for this encounter.    Myra Gianotti, PA-C Surgical Short  Stay/Anesthesiology [facility] Phone [phone] [facility] Phone [phone] 12/19/2021 9:07 AM

## 2021-12-24 ENCOUNTER — Ambulatory Visit (HOSPITAL_COMMUNITY): Payer: PPO

## 2021-12-24 ENCOUNTER — Ambulatory Visit (HOSPITAL_COMMUNITY): Payer: PPO | Admitting: Vascular Surgery

## 2021-12-24 ENCOUNTER — Other Ambulatory Visit: Payer: Self-pay

## 2021-12-24 ENCOUNTER — Ambulatory Visit (HOSPITAL_BASED_OUTPATIENT_CLINIC_OR_DEPARTMENT_OTHER): Payer: PPO | Admitting: Certified Registered Nurse Anesthetist

## 2021-12-24 ENCOUNTER — Observation Stay (HOSPITAL_COMMUNITY)
Admission: RE | Admit: 2021-12-24 | Discharge: 2021-12-25 | Disposition: A | Discharge status: Home / Self Care | Payer: PPO | Attending: Neurosurgery | Admitting: Neurosurgery

## 2021-12-24 ENCOUNTER — Encounter (HOSPITAL_COMMUNITY)
Admission: RE | Disposition: A | Discharge status: Home / Self Care | Payer: Self-pay | Source: Home / Self Care | Attending: Neurosurgery

## 2021-12-24 ENCOUNTER — Encounter (HOSPITAL_COMMUNITY): Payer: Self-pay | Admitting: Neurosurgery

## 2021-12-24 DIAGNOSIS — M4802 Spinal stenosis, cervical region: Secondary | ICD-10-CM

## 2021-12-24 DIAGNOSIS — M5412 Radiculopathy, cervical region: Secondary | ICD-10-CM | POA: Diagnosis not present

## 2021-12-24 DIAGNOSIS — Z981 Arthrodesis status: Secondary | ICD-10-CM | POA: Diagnosis not present

## 2021-12-24 DIAGNOSIS — G959 Disease of spinal cord, unspecified: Secondary | ICD-10-CM | POA: Insufficient documentation

## 2021-12-24 DIAGNOSIS — M4712 Other spondylosis with myelopathy, cervical region: Principal | ICD-10-CM | POA: Insufficient documentation

## 2021-12-24 DIAGNOSIS — I10 Essential (primary) hypertension: Secondary | ICD-10-CM | POA: Insufficient documentation

## 2021-12-24 DIAGNOSIS — M50122 Cervical disc disorder at C5-C6 level with radiculopathy: Secondary | ICD-10-CM | POA: Diagnosis not present

## 2021-12-24 DIAGNOSIS — Z85828 Personal history of other malignant neoplasm of skin: Secondary | ICD-10-CM | POA: Diagnosis not present

## 2021-12-24 DIAGNOSIS — Z79899 Other long term (current) drug therapy: Secondary | ICD-10-CM | POA: Diagnosis not present

## 2021-12-24 DIAGNOSIS — M50022 Cervical disc disorder at C5-C6 level with myelopathy: Secondary | ICD-10-CM | POA: Diagnosis not present

## 2021-12-24 DIAGNOSIS — M4322 Fusion of spine, cervical region: Secondary | ICD-10-CM | POA: Diagnosis not present

## 2021-12-24 HISTORY — PX: ANTERIOR CERVICAL DECOMP/DISCECTOMY FUSION: SHX1161

## 2021-12-24 SURGERY — ANTERIOR CERVICAL DECOMPRESSION/DISCECTOMY FUSION 2 LEVELS
Anesthesia: General | Site: Spine Cervical

## 2021-12-24 MED ORDER — SODIUM CHLORIDE 0.9 % IV SOLN
250.0000 mL | INTRAVENOUS | Status: DC
  Administered 2021-12-24: 250 mL via INTRAVENOUS

## 2021-12-24 MED ORDER — PRAVASTATIN SODIUM 10 MG PO TABS: 10.0000 mg | ORAL_TABLET | ORAL | Status: DC

## 2021-12-24 MED ORDER — LOSARTAN POTASSIUM 50 MG PO TABS
50.0000 mg | ORAL_TABLET | Freq: Every day | ORAL | Status: DC
  Administered 2021-12-25: 50 mg via ORAL
  Filled 2021-12-24: qty 1

## 2021-12-24 MED ORDER — ONDANSETRON HCL 4 MG PO TABS: 4.0000 mg | ORAL_TABLET | Freq: Four times a day (QID) | ORAL | Status: DC | PRN

## 2021-12-24 MED ORDER — CHLORHEXIDINE GLUCONATE CLOTH 2 % EX PADS: 6.0000 | MEDICATED_PAD | Freq: Once | CUTANEOUS | Status: DC

## 2021-12-24 MED ORDER — CEFAZOLIN SODIUM-DEXTROSE 1-4 GM/50ML-% IV SOLN
1.0000 g | Freq: Three times a day (TID) | INTRAVENOUS | Status: AC
  Administered 2021-12-24 (×2): 1 g via INTRAVENOUS
  Filled 2021-12-24 (×2): qty 50

## 2021-12-24 MED ORDER — PANTOPRAZOLE SODIUM 40 MG PO TBEC
40.0000 mg | DELAYED_RELEASE_TABLET | Freq: Every day | ORAL | Status: DC
  Administered 2021-12-24: 40 mg via ORAL
  Filled 2021-12-24: qty 1

## 2021-12-24 MED ORDER — ACETAMINOPHEN 325 MG PO TABS: 650.0000 mg | ORAL_TABLET | ORAL | Status: DC | PRN

## 2021-12-24 MED ORDER — HEMOSTATIC AGENTS (NO CHARGE) OPTIME
TOPICAL | Status: DC | PRN
  Administered 2021-12-24: 1 via TOPICAL

## 2021-12-24 MED ORDER — ORAL CARE MOUTH RINSE: 15.0000 mL | Freq: Once | OROMUCOSAL | Status: AC

## 2021-12-24 MED ORDER — ACETAMINOPHEN 10 MG/ML IV SOLN: 1000.0000 mg | Freq: Once | INTRAVENOUS | Status: DC | PRN

## 2021-12-24 MED ORDER — TRAMADOL HCL 50 MG PO TABS: 50.0000 mg | ORAL_TABLET | Freq: Four times a day (QID) | ORAL | Status: DC | PRN

## 2021-12-24 MED ORDER — GABAPENTIN 300 MG PO CAPS
300.0000 mg | ORAL_CAPSULE | Freq: Every day | ORAL | Status: DC
  Administered 2021-12-24: 300 mg via ORAL
  Filled 2021-12-24: qty 1

## 2021-12-24 MED ORDER — LIDOCAINE 2% (20 MG/ML) 5 ML SYRINGE
INTRAMUSCULAR | Status: DC | PRN
  Administered 2021-12-24: 100 mg via INTRAVENOUS

## 2021-12-24 MED ORDER — PHENOL 1.4 % MT LIQD
1.0000 | OROMUCOSAL | Status: DC | PRN
  Administered 2021-12-24: 1 via OROMUCOSAL
  Filled 2021-12-24: qty 177

## 2021-12-24 MED ORDER — HYDROCODONE-ACETAMINOPHEN 5-325 MG PO TABS: 1.0000 | ORAL_TABLET | ORAL | Status: DC | PRN

## 2021-12-24 MED ORDER — MENTHOL 3 MG MT LOZG
1.0000 | LOZENGE | OROMUCOSAL | Status: DC | PRN
  Filled 2021-12-24: qty 9

## 2021-12-24 MED ORDER — THROMBIN 5000 UNITS EX SOLR
CUTANEOUS | Status: AC
  Filled 2021-12-24: qty 10000

## 2021-12-24 MED ORDER — FENTANYL CITRATE (PF) 250 MCG/5ML IJ SOLN
INTRAMUSCULAR | Status: AC
  Filled 2021-12-24: qty 5

## 2021-12-24 MED ORDER — THROMBIN 5000 UNITS EX SOLR: OROMUCOSAL | Status: DC | PRN

## 2021-12-24 MED ORDER — ONDANSETRON HCL 4 MG/2ML IJ SOLN
INTRAMUSCULAR | Status: AC
  Filled 2021-12-24: qty 2

## 2021-12-24 MED ORDER — TIZANIDINE HCL 4 MG PO TABS
4.0000 mg | ORAL_TABLET | Freq: Three times a day (TID) | ORAL | Status: DC | PRN
  Administered 2021-12-24 (×2): 4 mg via ORAL
  Filled 2021-12-24 (×2): qty 1

## 2021-12-24 MED ORDER — ROCURONIUM BROMIDE 10 MG/ML (PF) SYRINGE
PREFILLED_SYRINGE | INTRAVENOUS | Status: DC | PRN
  Administered 2021-12-24: 70 mg via INTRAVENOUS

## 2021-12-24 MED ORDER — GABAPENTIN 300 MG PO CAPS: 300.0000 mg | ORAL_CAPSULE | Freq: Every day | ORAL | Status: DC

## 2021-12-24 MED ORDER — ACETAMINOPHEN 325 MG PO TABS: 325.0000 mg | ORAL_TABLET | Freq: Once | ORAL | Status: DC | PRN

## 2021-12-24 MED ORDER — AMISULPRIDE (ANTIEMETIC) 5 MG/2ML IV SOLN: 10.0000 mg | Freq: Once | INTRAVENOUS | Status: DC | PRN

## 2021-12-24 MED ORDER — LIDOCAINE 2% (20 MG/ML) 5 ML SYRINGE
INTRAMUSCULAR | Status: AC
  Filled 2021-12-24: qty 5

## 2021-12-24 MED ORDER — DEXMEDETOMIDINE HCL IN NACL 80 MCG/20ML IV SOLN
INTRAVENOUS | Status: DC | PRN
  Administered 2021-12-24: 12 ug via BUCCAL
  Administered 2021-12-24: 8 ug via BUCCAL

## 2021-12-24 MED ORDER — GABAPENTIN 300 MG PO CAPS
300.0000 mg | ORAL_CAPSULE | Freq: Every day | ORAL | Status: DC
  Filled 2021-12-24: qty 1

## 2021-12-24 MED ORDER — MIDAZOLAM HCL 2 MG/2ML IJ SOLN
INTRAMUSCULAR | Status: AC
  Filled 2021-12-24: qty 2

## 2021-12-24 MED ORDER — HYDROMORPHONE HCL 1 MG/ML IJ SOLN: 1.0000 mg | INTRAMUSCULAR | Status: DC | PRN

## 2021-12-24 MED ORDER — ACETAMINOPHEN 160 MG/5ML PO SOLN: 325.0000 mg | Freq: Once | ORAL | Status: DC | PRN

## 2021-12-24 MED ORDER — ROCURONIUM BROMIDE 10 MG/ML (PF) SYRINGE
PREFILLED_SYRINGE | INTRAVENOUS | Status: AC
  Filled 2021-12-24: qty 10

## 2021-12-24 MED ORDER — HYDROCODONE-ACETAMINOPHEN 10-325 MG PO TABS
2.0000 | ORAL_TABLET | ORAL | Status: DC | PRN
  Administered 2021-12-24 – 2021-12-25 (×4): 2 via ORAL
  Filled 2021-12-24 (×4): qty 2

## 2021-12-24 MED ORDER — LACTATED RINGERS IV SOLN: INTRAVENOUS | Status: DC | PRN

## 2021-12-24 MED ORDER — LACTATED RINGERS IV SOLN: INTRAVENOUS | Status: DC

## 2021-12-24 MED ORDER — THROMBIN 5000 UNITS EX SOLR
CUTANEOUS | Status: DC | PRN
  Administered 2021-12-24 (×2): 5000 [IU] via TOPICAL

## 2021-12-24 MED ORDER — FENOFIBRATE 160 MG PO TABS
160.0000 mg | ORAL_TABLET | Freq: Every day | ORAL | Status: DC
  Administered 2021-12-25: 160 mg via ORAL
  Filled 2021-12-24: qty 1

## 2021-12-24 MED ORDER — DEXMEDETOMIDINE HCL IN NACL 80 MCG/20ML IV SOLN
INTRAVENOUS | Status: AC
  Filled 2021-12-24: qty 20

## 2021-12-24 MED ORDER — MEPERIDINE HCL 25 MG/ML IJ SOLN: 6.2500 mg | INTRAMUSCULAR | Status: DC | PRN

## 2021-12-24 MED ORDER — PROPOFOL 10 MG/ML IV BOLUS
INTRAVENOUS | Status: AC
  Filled 2021-12-24: qty 20

## 2021-12-24 MED ORDER — SODIUM CHLORIDE 0.9% FLUSH: 3.0000 mL | INTRAVENOUS | Status: DC | PRN

## 2021-12-24 MED ORDER — ONDANSETRON HCL 4 MG/2ML IJ SOLN: 4.0000 mg | Freq: Four times a day (QID) | INTRAMUSCULAR | Status: DC | PRN

## 2021-12-24 MED ORDER — PHENYLEPHRINE HCL-NACL 20-0.9 MG/250ML-% IV SOLN
INTRAVENOUS | Status: DC | PRN
  Administered 2021-12-24: 10 ug/min via INTRAVENOUS

## 2021-12-24 MED ORDER — CHLORHEXIDINE GLUCONATE 0.12 % MT SOLN
15.0000 mL | Freq: Once | OROMUCOSAL | Status: AC
  Administered 2021-12-24: 15 mL via OROMUCOSAL
  Filled 2021-12-24: qty 15

## 2021-12-24 MED ORDER — ONDANSETRON HCL 4 MG/2ML IJ SOLN
INTRAMUSCULAR | Status: DC | PRN
  Administered 2021-12-24: 4 mg via INTRAVENOUS

## 2021-12-24 MED ORDER — PROPOFOL 10 MG/ML IV BOLUS
INTRAVENOUS | Status: DC | PRN
  Administered 2021-12-24: 130 mg via INTRAVENOUS

## 2021-12-24 MED ORDER — HYDROMORPHONE HCL 1 MG/ML IJ SOLN
0.2500 mg | INTRAMUSCULAR | Status: DC | PRN
  Administered 2021-12-24: 0.5 mg via INTRAVENOUS
  Administered 2021-12-24: 1 mg via INTRAVENOUS

## 2021-12-24 MED ORDER — HYDROMORPHONE HCL 1 MG/ML IJ SOLN
INTRAMUSCULAR | Status: AC
  Filled 2021-12-24: qty 2

## 2021-12-24 MED ORDER — SUCCINYLCHOLINE CHLORIDE 200 MG/10ML IV SOSY
PREFILLED_SYRINGE | INTRAVENOUS | Status: AC
  Filled 2021-12-24: qty 10

## 2021-12-24 MED ORDER — DEXAMETHASONE SODIUM PHOSPHATE 10 MG/ML IJ SOLN
INTRAMUSCULAR | Status: DC | PRN
  Administered 2021-12-24: 10 mg via INTRAVENOUS

## 2021-12-24 MED ORDER — LORATADINE 10 MG PO TABS
10.0000 mg | ORAL_TABLET | Freq: Every day | ORAL | Status: DC
  Filled 2021-12-24: qty 1

## 2021-12-24 MED ORDER — FENTANYL CITRATE (PF) 250 MCG/5ML IJ SOLN
INTRAMUSCULAR | Status: DC | PRN
  Administered 2021-12-24 (×5): 50 ug via INTRAVENOUS

## 2021-12-24 MED ORDER — PROMETHAZINE HCL 25 MG/ML IJ SOLN: 6.2500 mg | INTRAMUSCULAR | Status: DC | PRN

## 2021-12-24 MED ORDER — MIDAZOLAM HCL 5 MG/5ML IJ SOLN
INTRAMUSCULAR | Status: DC | PRN
  Administered 2021-12-24: 2 mg via INTRAVENOUS

## 2021-12-24 MED ORDER — GABAPENTIN 300 MG PO CAPS
600.0000 mg | ORAL_CAPSULE | Freq: Two times a day (BID) | ORAL | Status: DC
  Administered 2021-12-24 – 2021-12-25 (×2): 600 mg via ORAL
  Filled 2021-12-24 (×2): qty 2

## 2021-12-24 MED ORDER — CYCLOBENZAPRINE HCL 10 MG PO TABS: 10.0000 mg | ORAL_TABLET | Freq: Three times a day (TID) | ORAL | Status: DC | PRN

## 2021-12-24 MED ORDER — 0.9 % SODIUM CHLORIDE (POUR BTL) OPTIME
TOPICAL | Status: DC | PRN
  Administered 2021-12-24: 1000 mL

## 2021-12-24 MED ORDER — HYDROCHLOROTHIAZIDE 12.5 MG PO TABS
12.5000 mg | ORAL_TABLET | Freq: Every day | ORAL | Status: DC
  Administered 2021-12-25: 12.5 mg via ORAL
  Filled 2021-12-24: qty 1

## 2021-12-24 MED ORDER — FAMOTIDINE 20 MG PO TABS
20.0000 mg | ORAL_TABLET | Freq: Every day | ORAL | Status: DC
  Administered 2021-12-25: 20 mg via ORAL
  Filled 2021-12-24: qty 1

## 2021-12-24 MED ORDER — ACETAMINOPHEN 650 MG RE SUPP: 650.0000 mg | RECTAL | Status: DC | PRN

## 2021-12-24 MED ORDER — SUGAMMADEX SODIUM 200 MG/2ML IV SOLN
INTRAVENOUS | Status: DC | PRN
  Administered 2021-12-24: 200 mg via INTRAVENOUS

## 2021-12-24 MED ORDER — CEFAZOLIN SODIUM-DEXTROSE 2-4 GM/100ML-% IV SOLN
2.0000 g | INTRAVENOUS | Status: AC
  Administered 2021-12-24: 2 g via INTRAVENOUS
  Filled 2021-12-24: qty 100

## 2021-12-24 MED ORDER — SODIUM CHLORIDE 0.9% FLUSH
3.0000 mL | Freq: Two times a day (BID) | INTRAVENOUS | Status: DC
  Administered 2021-12-24 (×2): 3 mL via INTRAVENOUS

## 2021-12-24 MED ORDER — DEXAMETHASONE SODIUM PHOSPHATE 10 MG/ML IJ SOLN
INTRAMUSCULAR | Status: AC
  Filled 2021-12-24: qty 1

## 2021-12-24 SURGICAL SUPPLY — 58 items
BAG COUNTER SPONGE SURGICOUNT (BAG) ×1 IMPLANT
BAG DECANTER FOR FLEXI CONT (MISCELLANEOUS) ×1 IMPLANT
BAND RUBBER #18 3X1/16 STRL (MISCELLANEOUS) ×2 IMPLANT
BENZOIN TINCTURE PRP APPL 2/3 (GAUZE/BANDAGES/DRESSINGS) ×1 IMPLANT
BIT DRILL 13 (BIT) IMPLANT
BUR MATCHSTICK NEURO 3.0 LAGG (BURR) ×1 IMPLANT
CAGE PEEK 6X14X11 (Cage) ×1 IMPLANT
CAGE PEEK 7X14X11 (Cage) ×1 IMPLANT
CANISTER SUCT 3000ML PPV (MISCELLANEOUS) ×1 IMPLANT
CARTRIDGE OIL MAESTRO DRILL (MISCELLANEOUS) ×1 IMPLANT
CLSR STERI-STRIP ANTIMIC 1/2X4 (GAUZE/BANDAGES/DRESSINGS) IMPLANT
DIFFUSER DRILL AIR PNEUMATIC (MISCELLANEOUS) ×1 IMPLANT
DRAPE C-ARM 42X72 X-RAY (DRAPES) ×2 IMPLANT
DRAPE LAPAROTOMY 100X72 PEDS (DRAPES) ×1 IMPLANT
DRAPE MICROSCOPE SLANT 54X150 (MISCELLANEOUS) ×1 IMPLANT
DURAPREP 6ML APPLICATOR 50/CS (WOUND CARE) ×1 IMPLANT
ELECT COATED BLADE 2.86 ST (ELECTRODE) ×1 IMPLANT
ELECT REM PT RETURN 9FT ADLT (ELECTROSURGICAL) ×1
ELECTRODE REM PT RTRN 9FT ADLT (ELECTROSURGICAL) ×1 IMPLANT
GAUZE 4X4 16PLY ~~LOC~~+RFID DBL (SPONGE) IMPLANT
GAUZE SPONGE 4X4 12PLY STRL (GAUZE/BANDAGES/DRESSINGS) ×1 IMPLANT
GAUZE SPONGE 4X4 12PLY STRL LF (GAUZE/BANDAGES/DRESSINGS) IMPLANT
GLOVE ECLIPSE 9.0 STRL (GLOVE) ×1 IMPLANT
GLOVE EXAM NITRILE XL STR (GLOVE) IMPLANT
GOWN STRL REUS W/ TWL LRG LVL3 (GOWN DISPOSABLE) IMPLANT
GOWN STRL REUS W/ TWL XL LVL3 (GOWN DISPOSABLE) ×1 IMPLANT
GOWN STRL REUS W/TWL 2XL LVL3 (GOWN DISPOSABLE) IMPLANT
GOWN STRL REUS W/TWL LRG LVL3 (GOWN DISPOSABLE) ×1
GOWN STRL REUS W/TWL XL LVL3 (GOWN DISPOSABLE) ×4
HALTER HD/CHIN CERV TRACTION D (MISCELLANEOUS) ×1 IMPLANT
HEMOSTAT POWDER KIT SURGIFOAM (HEMOSTASIS) IMPLANT
HEMOSTAT SURGICEL 2X14 (HEMOSTASIS) IMPLANT
KIT BASIN OR (CUSTOM PROCEDURE TRAY) ×1 IMPLANT
KIT TURNOVER KIT B (KITS) ×1 IMPLANT
NDL SPNL 20GX3.5 QUINCKE YW (NEEDLE) ×1 IMPLANT
NEEDLE SPNL 20GX3.5 QUINCKE YW (NEEDLE) ×1 IMPLANT
NS IRRIG 1000ML POUR BTL (IV SOLUTION) ×1 IMPLANT
OIL CARTRIDGE MAESTRO DRILL (MISCELLANEOUS)
PACK LAMINECTOMY NEURO (CUSTOM PROCEDURE TRAY) ×1 IMPLANT
PAD ARMBOARD 7.5X6 YLW CONV (MISCELLANEOUS) ×3 IMPLANT
PLATE 45MM (Plate) ×1 IMPLANT
PLATE 45XATL VS ELT (Plate) IMPLANT
SCREW ST 13X4XST VA NS SPNE (Screw) IMPLANT
SCREW ST VAR 4 ATL (Screw) ×6 IMPLANT
SPACER SPNL 11X14X6XPEEK CVD (Cage) IMPLANT
SPACER SPNL 11X14X7XPEEK CVD (Cage) IMPLANT
SPCR SPNL 11X14X6XPEEK CVD (Cage) ×1 IMPLANT
SPCR SPNL 11X14X7XPEEK CVD (Cage) ×1 IMPLANT
SPONGE INTESTINAL PEANUT (DISPOSABLE) ×1 IMPLANT
SPONGE SURGIFOAM ABS GEL SZ50 (HEMOSTASIS) ×1 IMPLANT
STRIP CLOSURE SKIN 1/2X4 (GAUZE/BANDAGES/DRESSINGS) ×1 IMPLANT
SUT VIC AB 3-0 SH 8-18 (SUTURE) ×1 IMPLANT
SUT VIC AB 4-0 RB1 18 (SUTURE) ×1 IMPLANT
TAPE CLOTH 4X10 WHT NS (GAUZE/BANDAGES/DRESSINGS) ×1 IMPLANT
TOWEL GREEN STERILE (TOWEL DISPOSABLE) ×1 IMPLANT
TOWEL GREEN STERILE FF (TOWEL DISPOSABLE) ×1 IMPLANT
TRAP SPECIMEN MUCUS 40CC (MISCELLANEOUS) ×1 IMPLANT
WATER STERILE IRR 1000ML POUR (IV SOLUTION) ×1 IMPLANT

## 2021-12-24 NOTE — H&P (Signed)
Rodney Clark is an 66 y.o. male.   Chief Complaint: Neck pain HPI: 66 year old male with severe neck pain with radiation to both proximal shoulders.  Patient with some intermittent radiating numbness and paresthesias in both distal upper extremities.  No definite weakness.  No lower extremities function.  Work-up demonstrates evidence of severe cervical disc degeneration with degenerative anterolisthesis of C5 on C6 and degenerative retrolisthesis on C6 and C7 causing significant spinal stenosis with severe neuroforaminal stenosis at C5-6 and C6-7.  Patient presents now for two-level anterior cervical decompression and fusion in hopes of improving his symptoms.  Past Medical History:  Diagnosis Date   Actinic keratosis    Allergic state    Allergy    Arthritis    Atypical chest pain    GERD (gastroesophageal reflux disease)    Gout    Hyperglycemia    Hyperlipidemia    Hypertension    Neuromuscular disorder (HCC)    nerve damage right leg   Numbness of toes    Sleep apnea    wears CPAP   Squamous cell carcinoma of skin 11/16/2018   left nasal supratip (Dr. Evorn Gong). Tx: LN2, 5FU/Calcipotriene cream   Squamous cell carcinoma of skin    L temple, txted yrs ago   Thoracic aortic aneurysm [facility])     Past Surgical History:  Procedure Laterality Date   APPENDECTOMY     BACK SURGERY  01/05/2018   CATARACT EXTRACTION Bilateral    COLONOSCOPY N/A 05/14/2015   Procedure: COLONOSCOPY;  Surgeon: Manya Silvas, MD;  Location: [place];  Service: Endoscopy;  Laterality: N/A;   COLONOSCOPY     HERNIA REPAIR     x2   ROTATOR CUFF REPAIR Right    TONSILLECTOMY      Family History  Problem Relation Age of Onset   Hypertension Mother    Colon cancer Neg Hx    Esophageal cancer Neg Hx    Rectal cancer Neg Hx    Stomach cancer Neg Hx    Social History:  reports that he has never smoked. He has never used smokeless tobacco. He reports current alcohol use of about 3.0 standard  drinks of alcohol per week. He reports that he does not use drugs.  Allergies:  Allergies  Allergen Reactions   Crestor [Rosuvastatin] Other (See Comments)    Joint pain   Lyrica [Pregabalin]     "Hung around too long and made me feel funny"    Medications Prior to Admission  Medication Sig Dispense Refill   famotidine (PEPCID) 20 MG tablet Take 20 mg by mouth daily.      fenofibrate 160 MG tablet Take 160 mg by mouth daily.     fexofenadine (ALLEGRA) 180 MG tablet Take 180 mg by mouth daily.     gabapentin (NEURONTIN) 300 MG capsule Take 2 capsules (600 mg total) by mouth 2 (two) times daily at 10 AM and 5 PM. (Patient taking differently: Take 300 mg by mouth 5 (five) times daily.)     hydrochlorothiazide (HYDRODIURIL) 12.5 MG tablet Take 1 tablet by mouth daily.     losartan (COZAAR) 50 MG tablet Take 50 mg by mouth daily.     NON FORMULARY Apply 1 Application topically daily as needed (pain). CBD Cream     pantoprazole (PROTONIX) 40 MG tablet Take 1 tablet (40 mg total) by mouth at bedtime. 30 tablet 0   pravastatin (PRAVACHOL) 10 MG tablet Take 10 mg by mouth every Friday.  tiZANidine (ZANAFLEX) 4 MG tablet Take 4 mg by mouth 3 (three) times daily. Only uses PRN  4   traMADol (ULTRAM) 50 MG tablet Take 50 mg by mouth every 6 (six) hours as needed for moderate pain.     gabapentin (NEURONTIN) 300 MG capsule Take 1 capsule (300 mg total) by mouth at bedtime. (Patient not taking: Reported on 12/12/2021)     Menthol, Topical Analgesic, (BIOFREEZE) 10 % CREA Apply 1 Application topically daily as needed (pain).     predniSONE (DELTASONE) 10 MG tablet One tab po daily for two days then 1/2 tab po daily for four days 4 tablet 0    No results found for this or any previous visit (from the past 48 hour(s)). No results found.  Pertinent items noted in HPI and remainder of comprehensive ROS otherwise negative.  Blood pressure (!) 151/79, pulse (!) 54, temperature 97.8 F (36.6 C), resp.  rate 18, height '5\' 10"'$  (1.778 m), weight 129.3 kg, SpO2 96 %.  Patient is awake and alert.  He is oriented and appropriate.  Speech is fluent.  Judgment insight are intact.  Cranial nerve function normal bilateral.  Motor examination reveals intact motor strength bilaterally.  Sensory examination some decrease sensation pinprick and light touch in the C6 and C7 dermatomes bilaterally.  Reflexes are hypoactive but symmetric.  No evidence of long track signs.  Gait normal.  Examination head ears eyes nose and throat is unremarkable chest and abdomen is benign.  Extremities are free from injury or deformity. Assessment/Plan Cervical stenosis with deformity and radiculopathy.  Plan C5-6, C6-7 anterior cervical discectomy and fusion.  Risks and benefits been explained.  Patient wishes to proceed.  Cooper Render Tannor Pyon 12/24/2021, 7:46 AM

## 2021-12-24 NOTE — Progress Notes (Signed)
Orthopedic Tech Progress Note Patient Details:  Rodney Clark 1956/02/10 [id number]  Patient has collar per RN   Patient ID: Rodney Clark, male   DOB: 02-08-1956, 66 y.o.   MRN: [id number]  [name] 12/24/2021, 1:33 PM

## 2021-12-24 NOTE — Op Note (Signed)
Date of procedure: 12/24/2021  Date of dictation: Same  Service: Neurosurgery  Preoperative diagnosis: Cervical stenosis with myelopathy and radiculopathy  Postoperative diagnosis: Same  Procedure Name: C5-6, C6-7 anterior cervical discectomy with interbody fusion utilizing interbody cages, locally harvested autograft, and anterior plate instrumentation  Surgeon:Sharlette Jansma A.Madalynne Gutmann, M.D.  Asst. Surgeon: Reinaldo Meeker, NP  Anesthesia: General  Indication: 66 year old male with severe neck pain with bilateral radicular symptoms failing conservative management.  Work-up demonstrates evidence of severe disc degeneration with degenerative listhesis and severe stenosis at C5-6 and C6-7.  Patient presents now for two-level anterior cervical decompression and fusion in hopes of improving his symptoms.  Operative note: After induction of anesthesia, patient positioned supine with neck slightly extended and held placed halter traction.  Patient's anterior cervical region prepped and draped sterilely.  Incision made overlying C6.  Dissection performed on the right.  Retractor placed.  Fluoroscopy used.  Levels confirmed.  Anterior osteophytes at C5-6 and C6-7 were resected.  Disc spaces were identified.  The spaces were then cleaned of all soft tissue while progressively distracting the interspaces.  Microscope brought into the field used for microdissection spinal canal.  Remaining aspects of annulus and osteophytes removed using high-speed drill down to level the posterior logical ligament.  Posterior logical was then elevated and resected in a piecemeal fashion.  Underlying thecal sac was identified.  A wide central decompression then performed undercutting the bodies of C5 and C6.  Decompression then proceeded into each neural foramina.  Wide anterior foraminotomies were performed on the course exiting C6 nerve roots bilaterally.  At this point a very thorough decompression of been achieved.  There was no evidence of  injury to thecal sac or nerve roots.  The procedure was then repeated at the C6-7 level again without complications.  Wound was then irrigated.  Gelfoam was placed topically for hemostasis then removed.  [employer] anatomic peek cages were then packed with locally harvested autograft and then impacted into place.  Each cage was recessed slightly from the anterior cortical margin.  45 mm Atlantis anterior cervical plate was then placed over the C5, C6 and C7 level.  This then attached under fluoroscopic guidance using 13 mm variable angle screws to each at all 3 levels.  All screws given final tightening found to be solidly within the bone.  Locking screws engaged all levels.  Final images reveal good position of the cages and the hardware at the proper operative level with normal alignment of spine.  Wound is then irrigated 1 final time.  Hemostasis was assured with bipolar cautery.  Wounds then closed in layers with Vicryl sutures.  Steri-Strips and sterile dressing were applied.  No apparent complications.  Patient tolerated the procedure well and he returns to the recovery room postop.

## 2021-12-24 NOTE — Transfer of Care (Signed)
Immediate Anesthesia Transfer of Care Note  Patient: Rodney Clark  Procedure(s) Performed: Anterior Cervical Decompression Fusion  Cervical five-Cervical six - Cervical six-Cervical seven (Spine Cervical)  Patient Location: PACU  Anesthesia Type:General  Level of Consciousness: awake, alert  and oriented  Airway & Oxygen Therapy: Patient Spontanous Breathing and Patient connected to face mask oxygen  Post-op Assessment: Report given to RN, Post -op Vital signs reviewed and stable and Patient moving all extremities X 4  Post vital signs: Reviewed and stable  Last Vitals:  Vitals Value Taken Time  BP 133/73 12/24/21 1045  Temp    Pulse 57 12/24/21 1045  Resp 19 12/24/21 1045  SpO2 92 % 12/24/21 1045  Vitals shown include unvalidated device data.  Last Pain:  Vitals:   12/24/21 1499  PainSc: 3          Complications: No notable events documented.

## 2021-12-24 NOTE — Brief Op Note (Signed)
12/24/2021  10:31 AM  PATIENT:  Rodney Clark  66 y.o. male  PRE-OPERATIVE DIAGNOSIS:  Stenosis  POST-OPERATIVE DIAGNOSIS:  Stenosis  PROCEDURE:  Procedure(s): Anterior Cervical Decompression Fusion  Cervical five-Cervical six - Cervical six-Cervical seven (N/A)  SURGEON:  Surgeon(s) and Role:    * Earnie Larsson, MD - Primary  PHYSICIAN ASSISTANT:   ASSISTANTSMearl Latin   ANESTHESIA:   general  EBL:  300 mL   BLOOD ADMINISTERED:none  DRAINS: none   LOCAL MEDICATIONS USED:  None     SPECIMEN:  No Specimen  DISPOSITION OF SPECIMEN:  N/A  COUNTS:  YES  TOURNIQUET:  * No tourniquets in log *  DICTATION: .Dragon Dictation  PLAN OF CARE: Admit for overnight observation  PATIENT DISPOSITION:  PACU - hemodynamically stable.   Delay start of Pharmacological VTE agent (>24hrs) due to surgical blood loss or risk of bleeding: yes

## 2021-12-24 NOTE — Anesthesia Procedure Notes (Signed)
Procedure Name: Intubation Date/Time: 12/24/2021 8:11 AM  Performed by: Lowella Dell, CRNAPre-anesthesia Checklist: Patient identified, Emergency Drugs available, Suction available and Patient being monitored Patient Re-evaluated:Patient Re-evaluated prior to induction Oxygen Delivery Method: Circle System Utilized Preoxygenation: Pre-oxygenation with 100% oxygen Induction Type: IV induction Ventilation: Mask ventilation without difficulty and Oral airway inserted - appropriate to patient size Laryngoscope Size: Glidescope and 4 Grade View: Grade I Tube type: Oral Tube size: 7.5 mm Number of attempts: 1 Airway Equipment and Method: Oral airway, Rigid stylet and Video-laryngoscopy Placement Confirmation: ETT inserted through vocal cords under direct vision, positive ETCO2 and breath sounds checked- equal and bilateral Secured at: 23 cm Tube secured with: Tape Dental Injury: Teeth and Oropharynx as per pre-operative assessment

## 2021-12-24 NOTE — Anesthesia Postprocedure Evaluation (Signed)
Anesthesia Post Note  Patient: TREMOND SHIMABUKURO  Procedure(s) Performed: Anterior Cervical Decompression Fusion  Cervical five-Cervical six - Cervical six-Cervical seven (Spine Cervical)     Patient location during evaluation: PACU Anesthesia Type: General Level of consciousness: awake and alert Pain management: pain level controlled Vital Signs Assessment: post-procedure vital signs reviewed and stable Respiratory status: spontaneous breathing, nonlabored ventilation, respiratory function stable and patient connected to nasal cannula oxygen Cardiovascular status: blood pressure returned to baseline and stable Postop Assessment: no apparent nausea or vomiting Anesthetic complications: no   No notable events documented.  Last Vitals:  Vitals:   12/24/21 1130 12/24/21 1145  BP: (!) 147/74 (!) 140/72  Pulse: (!) 51 (!) 52  Resp: 13 12  Temp:  (!) 36.4 C  SpO2: 93% 95%    Last Pain:  Vitals:   12/24/21 1145  PainSc: 0-No pain                 Effie Berkshire

## 2021-12-25 ENCOUNTER — Encounter (HOSPITAL_COMMUNITY): Payer: Self-pay | Admitting: Neurosurgery

## 2021-12-25 DIAGNOSIS — M4712 Other spondylosis with myelopathy, cervical region: Secondary | ICD-10-CM | POA: Diagnosis not present

## 2021-12-25 MED ORDER — HYDROCODONE-ACETAMINOPHEN 5-325 MG PO TABS: 1.0000 | ORAL_TABLET | ORAL | 0 refills | Status: DC | PRN

## 2021-12-25 NOTE — Evaluation (Signed)
Occupational Therapy Evaluation and Discharge Patient Details Name: Rodney Clark MRN: [id number] DOB: [birth year] Today's Date: 12/25/2021   History of Present Illness Pt admitted on 12/24/21 for C5-6, C6-7.   Clinical Impression   Pt educated in cervical precautions, compensatory strategies for ADLs, IADLs to avoid and fall prevention. Pt and wife verbalized understanding. No further OT needs.      Recommendations for follow up therapy are one component of a multi-disciplinary discharge planning process, led by the attending physician.  Recommendations may be updated based on patient status, additional functional criteria and insurance authorization.   Follow Up Recommendations  No OT follow up    Assistance Recommended at Discharge PRN  Patient can return home with the following Assistance with cooking/housework;Assist for transportation;Help with stairs or ramp for entrance    Functional Status Assessment  Patient has had a recent decline in their functional status and demonstrates the ability to make significant improvements in function in a reasonable and predictable amount of time.  Equipment Recommendations  None recommended by OT    Recommendations for Other Services       Precautions / Restrictions Precautions Precautions: Cervical Precaution Booklet Issued: Yes (comment) Required Braces or Orthoses: Cervical Brace Cervical Brace: Soft collar;Other (comment) (off for showering and can walk to bathroom without) Restrictions Weight Bearing Restrictions: No      Mobility Bed Mobility Overal bed mobility: Modified Independent             General bed mobility comments: HOB up    Transfers Overall transfer level: Independent Equipment used: None                      Balance                                           ADL either performed or assessed with clinical judgement   ADL Overall ADL's : Modified independent                                        General ADL Comments: Educated in compensatory strategies for ADLs and IADLs to avoid.     Vision Baseline Vision/History: 1 Wears glasses Patient Visual Report: No change from baseline       Perception     Praxis      Pertinent Vitals/Pain Pain Assessment Pain Assessment: Faces Faces Pain Scale: Hurts a little bit Pain Location: neck Pain Descriptors / Indicators: Discomfort, Sore Pain Intervention(s): Monitored during session, Premedicated before session     Hand Dominance Right   Extremity/Trunk Assessment Upper Extremity Assessment Upper Extremity Assessment: Overall WFL for tasks assessed   Lower Extremity Assessment Lower Extremity Assessment: Overall WFL for tasks assessed   Cervical / Trunk Assessment Cervical / Trunk Assessment: Neck Surgery   Communication Communication Communication: No difficulties   Cognition Arousal/Alertness: Awake/alert Behavior During Therapy: WFL for tasks assessed/performed Overall Cognitive Status: Within Functional Limits for tasks assessed                                       General Comments       Exercises     Shoulder Instructions  Home Living Family/patient expects to be discharged to:: Private residence Living Arrangements: Spouse/significant other Available Help at Discharge: Family;Available 24 hours/day Type of Home: [place]: Two level     Bathroom Shower/Tub: Occupational psychologist: Handicapped height     Home Equipment: Shower seat          Prior Functioning/Environment Prior Level of Function : Independent/Modified Independent               ADLs Comments: sits to shower, retired        OT Problem List:        OT Treatment/Interventions:      OT Goals(Current goals can be found in the care plan section)    OT Frequency:      Co-evaluation              AM-PAC OT "6 Clicks" Daily  Activity     Outcome Measure Help from another person eating meals?: None Help from another person taking care of personal grooming?: None Help from another person toileting, which includes using toliet, bedpan, or urinal?: None Help from another person bathing (including washing, rinsing, drying)?: None Help from another person to put on and taking off regular upper body clothing?: None Help from another person to put on and taking off regular lower body clothing?: None 6 Click Score: 24   End of Session    Activity Tolerance: Patient tolerated treatment well Patient left: in bed;with call bell/phone within reach;with family/visitor present  OT Visit Diagnosis: Pain                Time: 1791-5056 OT Time Calculation (min): 15 min Charges:  OT General Charges $OT Visit: 1 Visit OT Evaluation $OT Eval Low Complexity: 1 Low  Rodney Clark, OTR/L Acute Rehabilitation Services Office: [phone]  Rodney Clark 12/25/2021, 9:39 AM

## 2021-12-25 NOTE — Plan of Care (Signed)

## 2021-12-25 NOTE — Discharge Instructions (Signed)

## 2021-12-25 NOTE — Discharge Summary (Signed)
Physician Discharge Summary  Patient ID: Rodney Clark MRN: [id number] DOB/AGE: 1955-09-16 66 y.o.  Admit date: 12/24/2021 Discharge date: 12/25/2021  Admission Diagnoses:  Discharge Diagnoses:  Principal Problem:   Cervical spondylosis with myelopathy and radiculopathy   Discharged Condition: good  Hospital Course: Patient admitted to the hospital where he underwent uncomplicated two-level anterior cervical decompression and fusion.  Postoperatively doing well.  Preoperative neck and upper extremity symptoms much improved.  Moderate dysphagia.  Voice strong.  Ambulating and voiding without difficulty.  Ready for discharge home.  Consults:   Significant Diagnostic Studies:   Treatments:   Discharge Exam: Blood pressure (!) 145/73, pulse 71, temperature 98.2 F (36.8 C), temperature source Oral, resp. rate 18, height '5\' 10"'$  (1.778 m), weight 129.3 kg, SpO2 93 %. Awake and alert.  Oriented and appropriate.  Motor and sensory function intact.  Wound clean and dry.  Chest and abdomen benign.  Neck soft.  Voice strong.    Disposition: Discharge disposition: 01-Home or Self Care        Allergies as of 12/25/2021       Reactions   Crestor [rosuvastatin] Other (See Comments)   Joint pain   Lyrica [pregabalin]    "Hung around too long and made me feel funny"        Medication List     TAKE these medications    Biofreeze 10 % Crea Generic drug: Menthol (Topical Analgesic) Apply 1 Application topically daily as needed (pain).   famotidine 20 MG tablet Commonly known as: PEPCID Take 20 mg by mouth daily.   fenofibrate 160 MG tablet Take 160 mg by mouth daily.   fexofenadine 180 MG tablet Commonly known as: ALLEGRA Take 180 mg by mouth daily.   gabapentin 300 MG capsule Commonly known as: NEURONTIN Take 2 capsules (600 mg total) by mouth 2 (two) times daily at 10 AM and 5 PM. What changed:  how much to take when to take this   gabapentin 300 MG  capsule Commonly known as: NEURONTIN Take 1 capsule (300 mg total) by mouth at bedtime. What changed: Another medication with the same name was changed. Make sure you understand how and when to take each.   hydrochlorothiazide 12.5 MG tablet Commonly known as: HYDRODIURIL Take 1 tablet by mouth daily.   HYDROcodone-acetaminophen 5-325 MG tablet Commonly known as: NORCO/VICODIN Take 1 tablet by mouth every 4 (four) hours as needed for moderate pain ((score 4 to 6)).   losartan 50 MG tablet Commonly known as: COZAAR Take 50 mg by mouth daily.   NON FORMULARY Apply 1 Application topically daily as needed (pain). CBD Cream   pantoprazole 40 MG tablet Commonly known as: Protonix Take 1 tablet (40 mg total) by mouth at bedtime.   pravastatin 10 MG tablet Commonly known as: PRAVACHOL Take 10 mg by mouth every Friday.   predniSONE 10 MG tablet Commonly known as: DELTASONE One tab po daily for two days then 1/2 tab po daily for four days   tiZANidine 4 MG tablet Commonly known as: ZANAFLEX Take 4 mg by mouth 3 (three) times daily. Only uses PRN   traMADol 50 MG tablet Commonly known as: ULTRAM Take 50 mg by mouth every 6 (six) hours as needed for moderate pain.         Signed: Cooper Render Hazley Dezeeuw 12/25/2021, 9:40 AM

## 2022-01-06 ENCOUNTER — Ambulatory Visit: Payer: PPO | Admitting: Dermatology

## 2022-01-23 DIAGNOSIS — M5412 Radiculopathy, cervical region: Secondary | ICD-10-CM | POA: Diagnosis not present

## 2022-02-26 ENCOUNTER — Ambulatory Visit: Payer: PPO | Admitting: Dermatology

## 2022-02-26 VITALS — BP 152/84 | HR 82

## 2022-02-26 DIAGNOSIS — Z1283 Encounter for screening for malignant neoplasm of skin: Secondary | ICD-10-CM | POA: Diagnosis not present

## 2022-02-26 DIAGNOSIS — L304 Erythema intertrigo: Secondary | ICD-10-CM

## 2022-02-26 DIAGNOSIS — L821 Other seborrheic keratosis: Secondary | ICD-10-CM

## 2022-02-26 DIAGNOSIS — L570 Actinic keratosis: Secondary | ICD-10-CM | POA: Diagnosis not present

## 2022-02-26 DIAGNOSIS — L814 Other melanin hyperpigmentation: Secondary | ICD-10-CM | POA: Diagnosis not present

## 2022-02-26 DIAGNOSIS — Z79899 Other long term (current) drug therapy: Secondary | ICD-10-CM | POA: Diagnosis not present

## 2022-02-26 DIAGNOSIS — L918 Other hypertrophic disorders of the skin: Secondary | ICD-10-CM | POA: Diagnosis not present

## 2022-02-26 DIAGNOSIS — L578 Other skin changes due to chronic exposure to nonionizing radiation: Secondary | ICD-10-CM

## 2022-02-26 DIAGNOSIS — Z5111 Encounter for antineoplastic chemotherapy: Secondary | ICD-10-CM

## 2022-02-26 DIAGNOSIS — L0292 Furuncle, unspecified: Secondary | ICD-10-CM

## 2022-02-26 DIAGNOSIS — D229 Melanocytic nevi, unspecified: Secondary | ICD-10-CM | POA: Diagnosis not present

## 2022-02-26 DIAGNOSIS — M5412 Radiculopathy, cervical region: Secondary | ICD-10-CM | POA: Diagnosis not present

## 2022-02-26 MED ORDER — DOXYCYCLINE HYCLATE 100 MG PO TABS: ORAL_TABLET | ORAL | 0 refills | Status: DC

## 2022-02-26 NOTE — Patient Instructions (Addendum)
Starting in January apply fluorouracil/calcipotriene mix twice daily for seven days at the R distal nose side x 1, R forehead above the brow x 2, R cheek x 1, L cheek x 1, forehead above the L lat brow x 1.   Use [facility] intertrigo mix twice daily to rash on waistline as needed.   Instructions for [facility] Medications  One or more of your medications was sent to the [facility] mail order compounding pharmacy. You will receive an email from them and can purchase the medicine through that link. It will then be mailed to your home at the address you confirmed. If for any reason you do not receive an email from them, please check your spam folder. If you still do not find the email, please let us know. [facility] phone number is [phone].   5-Fluorouracil/Calcipotriene Patient Education   Actinic keratoses are the dry, red scaly spots on the skin caused by sun damage. A portion of these spots can turn into skin cancer with time, and treating them can help prevent development of skin cancer.   Treatment of these spots requires removal of the defective skin cells. There are various ways to remove actinic keratoses, including freezing with liquid nitrogen, treatment with creams, or treatment with a blue light procedure in the office.   5-fluorouracil cream is a topical cream used to treat actinic keratoses. It works by interfering with the growth of abnormal fast-growing skin cells, such as actinic keratoses. These cells peel off and are replaced by healthy ones. THIS CREAM SHOULD BE KEPT OUT OF REACH OF CHILDREN AND PETS AND SHOULD NOT BE USED BY PREGNANT WOMEN.  5-fluorouracil/calcipotriene is a combination of the 5-fluorouracil cream with a vitamin D analog cream called calcipotriene. The calcipotriene alone does not treat actinic keratoses. However, when it is combined with 5-fluorouracil, it helps the 5-fluorouracil treat the actinic keratoses much faster so that the same  results can be achieved with a much shorter treatment time.  INSTRUCTIONS FOR 5-FLUOROURACIL/CALCIPOTRIENE CREAM:   5-fluorouracil/calcipotriene cream typically only needs to be used for 4-7 days. A thin layer should be applied twice a day to the treatment areas recommended by your physician.   If your physician prescribed you separate tubes of 5-fluourouracil and calcipotriene, apply a thin layer of 5-fluorouracil followed by a thin layer of calcipotriene.   Avoid contact with your eyes or nostrils. Avoid applying the cream to your eyelids or lips unless directed to apply there by your physician. Do not use 5-fluorouracil/calcipotriene cream on infected or open wounds.   You will develop redness, irritation and some crusting at areas where you have pre-cancer damage/actinic keratoses. IF YOU DEVELOP PAIN, BLEEDING, OR SIGNIFICANT CRUSTING, STOP THE TREATMENT EARLY - you have already gotten a good response and the actinic keratoses should clear up well.  Wash your hands after applying 5-fluorouracil 5% cream on your skin.   A moisturizer or sunscreen with a minimum SPF 30 should be applied each morning.   Once you have finished the treatment, you can apply a thin layer of Vaseline twice a day to irritated areas to soothe and calm the areas more quickly. If you experience significant discomfort, contact your physician.  For some patients it is necessary to repeat the treatment for best results.  SIDE EFFECTS: When using 5-fluorouracil/calcipotriene cream, you may have mild irritation, such as redness, dryness, swelling, or a mild burning sensation. This usually resolves within 2 weeks. The more actinic keratoses you have,  the more redness and inflammation you can expect during treatment. Eye irritation has been reported rarely. If this occurs, please let us know.   If you have any trouble using this cream, please send [place] a MyChart message or call the office. If you have any other questions about  this information, please do not hesitate to ask me before you leave the office or contact me on MyChart or by phone.   Due to recent changes in healthcare laws, you may see results of your pathology and/or laboratory studies on MyChart before the doctors have had a chance to review them. We understand that in some cases there may be results that are confusing or concerning to you. Please understand that not all results are received at the same time and often the doctors may need to interpret multiple results in order to provide you with the best plan of care or course of treatment. Therefore, we ask that you please give [place] 2 business days to thoroughly review all your results before contacting the office for clarification. Should we see a critical lab result, you will be contacted sooner.   If You Need Anything After Your Visit  If you have any questions or concerns for your doctor, please call our main line at [phone] and press option 4 to reach your doctor's medical assistant. If no one answers, please leave a voicemail as directed and we will return your call as soon as possible. Messages left after 4 pm will be answered the following business day.   You may also send [place] a message via [place]. We typically respond to MyChart messages within 1-2 business days.  For prescription refills, please ask your pharmacy to contact our office. Our fax number is [phone].  If you have an urgent issue when the clinic is closed that cannot wait until the next business day, you can page your doctor at the number below.    Please note that while we do our best to be available for urgent issues outside of office hours, we are not available 24/7.   If you have an urgent issue and are unable to reach [place], you may choose to seek medical care at your doctor's office, retail clinic, urgent care center, or emergency room.  If you have a medical emergency, please immediately call 911 or go to the emergency  department.  Pager Numbers  - Dr. Nehemiah Massed: [phone]  - Dr. Laurence Ferrari: [phone]  - Dr. Nicole Kindred: [phone]  In the event of inclement weather, please call our main line at [phone] for an update on the status of any delays or closures.  Dermatology Medication Tips: Please keep the boxes that topical medications come in in order to help keep track of the instructions about where and how to use these. Pharmacies typically print the medication instructions only on the boxes and not directly on the medication tubes.   If your medication is too expensive, please contact our office at [phone] option 4 or send [place] a message through [place].   We are unable to tell what your co-pay for medications will be in advance as this is different depending on your insurance coverage. However, we may be able to find a substitute medication at lower cost or fill out paperwork to get insurance to cover a needed medication.   If a prior authorization is required to get your medication covered by your insurance company, please allow [place] 1-2 business days to complete this process.  Drug prices often vary  depending on where the prescription is filled and some pharmacies may offer cheaper prices.  The website www.goodrx.com contains coupons for medications through different pharmacies. The prices here do not account for what the cost may be with help from insurance (it may be cheaper with your insurance), but the website can give you the [place] if you did not use any insurance.  - You can print the associated coupon and take it with your prescription to the pharmacy.  - You may also stop by our office during regular business hours and pick up a GoodRx coupon card.  - If you need your prescription sent electronically to a different pharmacy, notify our office through [facility] or by phone at [phone] option 4.     Si Usted Necesita Algo Despus de Su Visita  Tambin puede enviarnos un  mensaje a travs de Pharmacist, community. Por lo general respondemos a los mensajes de MyChart en el transcurso de 1 a 2 das hbiles.  Para renovar recetas, por favor pida a su farmacia que se ponga en contacto con nuestra oficina. Harland Dingwall de fax es [place] [phone].  Si tiene un asunto urgente cuando la clnica est cerrada y que no puede esperar hasta el siguiente da hbil, puede llamar/localizar a su doctor(a) al nmero que aparece a continuacin.   Por favor, tenga en cuenta que aunque hacemos todo lo posible para estar disponibles para asuntos urgentes fuera del horario de [place], no estamos disponibles las 24 horas del da, los 7 das de la [place].   Si tiene un problema urgente y no puede comunicarse con nosotros, puede optar por buscar atencin mdica  en el consultorio de su doctor(a), en una clnica privada, en un centro de atencin urgente o en una sala de emergencias.  Si tiene Engineering geologist, por favor llame inmediatamente al 911 o vaya a la sala de emergencias.  Nmeros de bper  - Dr. Nehemiah Massed: [phone]  - Dra. Moye: [phone]  - Dra. Nicole Kindred: [phone]  En caso de inclemencias del [place], por favor llame a Johnsie Kindred principal al [phone] para una actualizacin sobre el [place] de cualquier retraso o cierre.  Consejos para la medicacin en dermatologa: Por favor, guarde las cajas en las que vienen los medicamentos de uso tpico para ayudarle a seguir las instrucciones sobre dnde y cmo usarlos. Las farmacias generalmente imprimen las instrucciones del medicamento slo en las cajas y no directamente en los tubos del [place].   Si su medicamento es muy caro, por favor, pngase en contacto con Zigmund Daniel llamando al [phone] y presione la opcin 4 o envenos un mensaje a travs de Pharmacist, community.   No podemos decirle cul ser su copago por los medicamentos por adelantado ya que esto es diferente dependiendo de la cobertura de su seguro. Sin embargo,  es posible que podamos encontrar un medicamento sustituto a Electrical engineer un formulario para que el seguro cubra el medicamento que se considera necesario.   Si se requiere una autorizacin previa para que su compaa de seguros [place] su medicamento, por favor permtanos de 1 a 2 das hbiles para completar este proceso.  Los precios de los medicamentos varan con frecuencia dependiendo del Environmental consultant de dnde se surte la receta y alguna farmacias pueden ofrecer precios ms baratos.  El sitio web www.goodrx.com tiene cupones para medicamentos de Airline pilot. Los precios aqu no tienen en cuenta lo que podra costar con la ayuda del seguro (puede ser ms barato con su seguro),  pero el sitio web puede darle el precio si no Field seismologist.  - Puede imprimir el cupn correspondiente y llevarlo con su receta a la farmacia.  - Tambin puede pasar por nuestra oficina durante el horario de atencin regular y Charity fundraiser una tarjeta de cupones de GoodRx.  - Si necesita que su receta se enve electrnicamente a una farmacia diferente, informe a nuestra oficina a travs de MyChart de [place] o por telfono llamando al [phone] y presione la opcin 4.

## 2022-02-26 NOTE — Progress Notes (Signed)
Follow-Up Visit   Subjective  Rodney Clark is a 66 y.o. male who presents for the following: Annual Exam (Hx SCC, Aks - pt has an irregular lesion on the nose that has been there for a few months and he would like checked today). The patient presents for Total-Body Skin Exam (TBSE) for skin cancer screening and mole check.  The patient has spots, moles and lesions to be evaluated, some may be new or changing and the patient has concerns that these could be cancer.  The following portions of the chart were reviewed this encounter and updated as appropriate:   Tobacco  Allergies  Meds  Problems  Med Hx  Surg Hx  Fam Hx     Review of Systems:  No other skin or systemic complaints except as noted in HPI or Assessment and Plan.  Objective  Well appearing patient in no apparent distress; mood and affect are within normal limits.  A full examination was performed including scalp, head, eyes, ears, nose, lips, neck, chest, axillae, abdomen, back, buttocks, bilateral upper extremities, bilateral lower extremities, hands, feet, fingers, toes, fingernails, and toenails. All findings within normal limits unless otherwise noted below.  R distal nose side x 1, R forehead above the brow x 2, R cheek x 1, L cheek x 1, forehead above the L lat brow x 1 (6) Erythematous thin papules/macules with gritty scale.   Trunk Erythema.   Assessment & Plan  AK (actinic keratosis) (6) R distal nose side x 1, R forehead above the brow x 2, R cheek x 1, L cheek x 1, forehead above the L lat brow x 1 Destruction of lesion - R distal nose side x 1, R forehead above the brow x 2, R cheek x 1, L cheek x 1, forehead above the L lat brow x 1 Complexity: simple   Destruction method: cryotherapy   Informed consent: discussed and consent obtained   Timeout:  patient name, date of birth, surgical site, and procedure verified Lesion destroyed using liquid nitrogen: Yes   Region frozen until ice ball extended beyond  lesion: Yes   Outcome: patient tolerated procedure well with no complications   Post-procedure details: wound care instructions given    Erythema intertrigo Trunk Intertrigo is a chronic recurrent rash that occurs in skin fold areas that may be associated with friction; heat; moisture; yeast; fungus; and bacteria.  It is exacerbated by increased movement / activity; sweating; and higher atmospheric temperature. Start Skin Medicinals mix BID to aa's PRN.   Furunculosis Left Axilla x 2 Vs cyst vs less likely lymphadenopathy -  Start Doxycycline '100mg'$  po BID x 1 week. Doxycycline should be taken with food to prevent nausea. Do not lay down for 30 minutes after taking. Be cautious with sun exposure and use good sun protection while on this medication. Pregnant women should not take this medication.  Discussed excision if they continue to be an issue. Pt to contact office in one month to report on condition.   doxycycline (VIBRA-TABS) 100 MG tablet - Left Axilla x 2 Take one tab po BID with food x 1 week.  Lentigines - Scattered tan macules - Due to sun exposure - Benign-appearing, observe - Recommend daily broad spectrum sunscreen SPF 30+ to sun-exposed areas, reapply every 2 hours as needed. - Call for any changes  Seborrheic Keratoses - Stuck-on, waxy, tan-brown papules and/or plaques  - Benign-appearing - Discussed benign etiology and prognosis. - Observe - Call for any  changes  Melanocytic Nevi - Tan-brown and/or pink-flesh-colored symmetric macules and papules - Benign appearing on exam today - Observation - Call clinic for new or changing moles - Recommend daily use of broad spectrum spf 30+ sunscreen to sun-exposed areas.   Hemangiomas - Red papules - Discussed benign nature - Observe - Call for any changes  Actinic Damage with PreCancerous Actinic Keratoses Counseling for Topical Chemotherapy Management: Patient exhibits: - Severe, confluent actinic changes with  pre-cancerous actinic keratoses that is secondary to cumulative UV radiation exposure over time - Condition that is severe; chronic, not at goal. - diffuse scaly erythematous macules and papules with underlying dyspigmentation - Discussed Prescription "Field Treatment" topical Chemotherapy for Severe, Chronic Confluent Actinic Changes with Pre-Cancerous Actinic Keratoses Field treatment involves treatment of an entire area of skin that has confluent Actinic Changes (Sun/ Ultraviolet light damage) and PreCancerous Actinic Keratoses by method of PhotoDynamic Therapy (PDT) and/or prescription Topical Chemotherapy agents such as 5-fluorouracil, 5-fluorouracil/calcipotriene, and/or imiquimod.  The purpose is to decrease the number of clinically evident and subclinical PreCancerous lesions to prevent progression to development of skin cancer by chemically destroying early precancer changes that may or may not be visible.  It has been shown to reduce the risk of developing skin cancer in the treated area. As a result of treatment, redness, scaling, crusting, and open sores may occur during treatment course. One or more than one of these methods may be used and may have to be used several times to control, suppress and eliminate the PreCancerous changes. Discussed treatment course, expected reaction, and possible side effects. - Recommend daily broad spectrum sunscreen SPF 30+ to sun-exposed areas, reapply every 2 hours as needed.  - Staying in the shade or wearing long sleeves, sun glasses (UVA+UVB protection) and wide brim hats (4-inch brim around the entire circumference of the hat) are also recommended. - Call for new or changing lesions. - Start 5FU/Calcipotriene mix BID x 7 days to the   Acrochordons (Skin Tags) - Fleshy, skin-colored pedunculated papules - Benign appearing.  - Observe. - If desired, they can be removed with an in office procedure that is not covered by insurance. - Please call the  clinic if you notice any new or changing lesions.  Skin cancer screening performed today.  Return in about 6 months (around 08/27/2022) for AK follow up .  Luther Redo, CMA, am acting as scribe for [name], MD . Documentation: I have reviewed the above documentation for accuracy and completeness, and I agree with the above.  [name], MD

## 2022-03-06 DIAGNOSIS — I10 Essential (primary) hypertension: Secondary | ICD-10-CM | POA: Diagnosis not present

## 2022-03-06 DIAGNOSIS — M79606 Pain in leg, unspecified: Secondary | ICD-10-CM | POA: Diagnosis not present

## 2022-03-06 DIAGNOSIS — M519 Unspecified thoracic, thoracolumbar and lumbosacral intervertebral disc disorder: Secondary | ICD-10-CM | POA: Diagnosis not present

## 2022-03-06 DIAGNOSIS — M5090 Cervical disc disorder, unspecified, unspecified cervical region: Secondary | ICD-10-CM | POA: Diagnosis not present

## 2022-03-06 DIAGNOSIS — I7121 Aneurysm of the ascending aorta, without rupture: Secondary | ICD-10-CM | POA: Diagnosis not present

## 2022-03-09 ENCOUNTER — Encounter: Payer: Self-pay | Admitting: Dermatology

## 2022-03-12 ENCOUNTER — Telehealth: Payer: Self-pay

## 2022-03-12 NOTE — Telephone Encounter (Signed)
Patient called today to let Dr. Nehemiah Massed know PCP reviewed under arms with furunculosis at his follow up. PCP was OK with the way things were healing but did prescribe one more round of antibiotics. Patient wanted to make you aware.  Skin Medicinals RX was not sent in on 11/29 office visit. Advised patient RX was sent in today for intertrigo mix and 66f/Calcipotriene mix.

## 2022-04-09 ENCOUNTER — Ambulatory Visit: Payer: PPO | Admitting: Dermatology

## 2022-04-21 DIAGNOSIS — M519 Unspecified thoracic, thoracolumbar and lumbosacral intervertebral disc disorder: Secondary | ICD-10-CM | POA: Diagnosis not present

## 2022-04-21 DIAGNOSIS — I7121 Aneurysm of the ascending aorta, without rupture: Secondary | ICD-10-CM | POA: Diagnosis not present

## 2022-04-21 DIAGNOSIS — E7849 Other hyperlipidemia: Secondary | ICD-10-CM | POA: Diagnosis not present

## 2022-04-21 DIAGNOSIS — Z125 Encounter for screening for malignant neoplasm of prostate: Secondary | ICD-10-CM | POA: Diagnosis not present

## 2022-04-21 DIAGNOSIS — R7303 Prediabetes: Secondary | ICD-10-CM | POA: Diagnosis not present

## 2022-04-21 DIAGNOSIS — K219 Gastro-esophageal reflux disease without esophagitis: Secondary | ICD-10-CM | POA: Diagnosis not present

## 2022-04-21 DIAGNOSIS — M5090 Cervical disc disorder, unspecified, unspecified cervical region: Secondary | ICD-10-CM | POA: Diagnosis not present

## 2022-04-28 DIAGNOSIS — Z0000 Encounter for general adult medical examination without abnormal findings: Secondary | ICD-10-CM | POA: Diagnosis not present

## 2022-04-28 DIAGNOSIS — M1611 Unilateral primary osteoarthritis, right hip: Secondary | ICD-10-CM | POA: Diagnosis not present

## 2022-04-28 DIAGNOSIS — I10 Essential (primary) hypertension: Secondary | ICD-10-CM | POA: Diagnosis not present

## 2022-04-28 DIAGNOSIS — E7849 Other hyperlipidemia: Secondary | ICD-10-CM | POA: Diagnosis not present

## 2022-04-28 DIAGNOSIS — R7303 Prediabetes: Secondary | ICD-10-CM | POA: Diagnosis not present

## 2022-04-28 DIAGNOSIS — R972 Elevated prostate specific antigen [PSA]: Secondary | ICD-10-CM | POA: Diagnosis not present

## 2022-04-28 DIAGNOSIS — M48062 Spinal stenosis, lumbar region with neurogenic claudication: Secondary | ICD-10-CM | POA: Diagnosis not present

## 2022-04-28 DIAGNOSIS — K219 Gastro-esophageal reflux disease without esophagitis: Secondary | ICD-10-CM | POA: Diagnosis not present

## 2022-04-28 DIAGNOSIS — I7121 Aneurysm of the ascending aorta, without rupture: Secondary | ICD-10-CM | POA: Diagnosis not present

## 2022-04-28 DIAGNOSIS — M5030 Other cervical disc degeneration, unspecified cervical region: Secondary | ICD-10-CM | POA: Diagnosis not present

## 2022-04-28 DIAGNOSIS — M5090 Cervical disc disorder, unspecified, unspecified cervical region: Secondary | ICD-10-CM | POA: Diagnosis not present

## 2022-04-28 DIAGNOSIS — M5416 Radiculopathy, lumbar region: Secondary | ICD-10-CM | POA: Diagnosis not present

## 2022-04-28 DIAGNOSIS — M25511 Pain in right shoulder: Secondary | ICD-10-CM | POA: Diagnosis not present

## 2022-04-28 DIAGNOSIS — M5412 Radiculopathy, cervical region: Secondary | ICD-10-CM | POA: Diagnosis not present

## 2022-04-28 DIAGNOSIS — G4733 Obstructive sleep apnea (adult) (pediatric): Secondary | ICD-10-CM | POA: Diagnosis not present

## 2022-04-28 DIAGNOSIS — M519 Unspecified thoracic, thoracolumbar and lumbosacral intervertebral disc disorder: Secondary | ICD-10-CM | POA: Diagnosis not present

## 2022-04-28 DIAGNOSIS — M4802 Spinal stenosis, cervical region: Secondary | ICD-10-CM | POA: Diagnosis not present

## 2022-05-02 DIAGNOSIS — M5416 Radiculopathy, lumbar region: Secondary | ICD-10-CM | POA: Diagnosis not present

## 2022-05-02 DIAGNOSIS — M48062 Spinal stenosis, lumbar region with neurogenic claudication: Secondary | ICD-10-CM | POA: Diagnosis not present

## 2022-05-12 DIAGNOSIS — Q741 Congenital malformation of knee: Secondary | ICD-10-CM | POA: Diagnosis not present

## 2022-05-12 DIAGNOSIS — M1711 Unilateral primary osteoarthritis, right knee: Secondary | ICD-10-CM | POA: Diagnosis not present

## 2022-06-02 DIAGNOSIS — M5136 Other intervertebral disc degeneration, lumbar region: Secondary | ICD-10-CM | POA: Diagnosis not present

## 2022-06-02 DIAGNOSIS — M5416 Radiculopathy, lumbar region: Secondary | ICD-10-CM | POA: Diagnosis not present

## 2022-06-02 DIAGNOSIS — M5412 Radiculopathy, cervical region: Secondary | ICD-10-CM | POA: Diagnosis not present

## 2022-06-02 DIAGNOSIS — M4802 Spinal stenosis, cervical region: Secondary | ICD-10-CM | POA: Diagnosis not present

## 2022-06-02 DIAGNOSIS — M48062 Spinal stenosis, lumbar region with neurogenic claudication: Secondary | ICD-10-CM | POA: Diagnosis not present

## 2022-06-02 DIAGNOSIS — M1611 Unilateral primary osteoarthritis, right hip: Secondary | ICD-10-CM | POA: Diagnosis not present

## 2022-06-02 DIAGNOSIS — M5030 Other cervical disc degeneration, unspecified cervical region: Secondary | ICD-10-CM | POA: Diagnosis not present

## 2022-07-07 DIAGNOSIS — M1711 Unilateral primary osteoarthritis, right knee: Secondary | ICD-10-CM | POA: Diagnosis not present

## 2022-07-07 DIAGNOSIS — Q741 Congenital malformation of knee: Secondary | ICD-10-CM | POA: Diagnosis not present

## 2022-07-07 DIAGNOSIS — G8929 Other chronic pain: Secondary | ICD-10-CM | POA: Diagnosis not present

## 2022-07-07 DIAGNOSIS — M25551 Pain in right hip: Secondary | ICD-10-CM | POA: Diagnosis not present

## 2022-07-08 ENCOUNTER — Other Ambulatory Visit: Payer: Self-pay | Admitting: Surgery

## 2022-07-08 DIAGNOSIS — M25551 Pain in right hip: Secondary | ICD-10-CM

## 2022-07-11 ENCOUNTER — Other Ambulatory Visit: Payer: PPO

## 2022-07-11 ENCOUNTER — Ambulatory Visit
Admission: RE | Admit: 2022-07-11 | Discharge: 2022-07-11 | Disposition: A | Discharge status: Home / Self Care | Payer: PPO | Source: Ambulatory Visit | Attending: Surgery | Admitting: Surgery

## 2022-07-11 DIAGNOSIS — M25551 Pain in right hip: Secondary | ICD-10-CM | POA: Diagnosis not present

## 2022-07-16 ENCOUNTER — Other Ambulatory Visit: Payer: Self-pay | Admitting: Surgery

## 2022-07-22 ENCOUNTER — Encounter
Admission: RE | Admit: 2022-07-22 | Discharge: 2022-07-22 | Disposition: A | Discharge status: Home / Self Care | Payer: PPO | Source: Ambulatory Visit | Attending: Surgery | Admitting: Surgery

## 2022-07-22 ENCOUNTER — Other Ambulatory Visit: Payer: Self-pay

## 2022-07-22 VITALS — Ht 71.0 in | Wt 290.0 lb

## 2022-07-22 DIAGNOSIS — G4733 Obstructive sleep apnea (adult) (pediatric): Secondary | ICD-10-CM

## 2022-07-22 DIAGNOSIS — I10 Essential (primary) hypertension: Secondary | ICD-10-CM

## 2022-07-22 HISTORY — DX: Prediabetes: R73.03

## 2022-07-22 NOTE — Patient Instructions (Addendum)
Your procedure is scheduled on: Wednesday 07/30/22 To find out your arrival time, please call [phone] between 1PM - 3PM on:   Tuesday 07/29/22 Report to the Registration Desk on the 1st floor of the [facility]. Valet parking is available.  If your arrival time is 6:00 am, do not arrive before that time as the [facility] entrance doors do not open until 6:00 am.  REMEMBER: Instructions that are not followed completely may result in serious medical risk, up to and including death; or upon the discretion of your surgeon and anesthesiologist your surgery may need to be rescheduled.  Do not eat food after midnight the night before surgery.  No gum chewing or hard candies.  You may however, drink CLEAR liquids up to 2 hours before you are scheduled to arrive for your surgery. Do not drink anything within 2 hours of your scheduled arrival time.  Clear liquids include: - water  - apple juice without pulp - gatorade (not RED colors) - black coffee or tea (Do NOT add milk or creamers to the coffee or tea) Do NOT drink anything that is not on this list.  Type 1 and Type 2 diabetics should only drink water.  In addition, your doctor has ordered for you to drink the provided:  Ensure Pre-Surgery Clear Carbohydrate Drink  Drinking this carbohydrate drink up to two hours before surgery helps to reduce insulin resistance and improve patient outcomes. Please complete drinking 2 hours before scheduled arrival time.  One week prior to surgery: Stop Anti-inflammatories (NSAIDS) such as Advil, Aleve, Ibuprofen, Motrin, Naproxen, Naprosyn and Aspirin based products such as Excedrin, Goody's Powder, BC Powder. You may however, continue to take Tylenol if needed for pain up until the day of surgery.  Stop ANY OVER THE COUNTER supplements or vitamins until after surgery.  Continue taking all prescribed medications.  TAKE ONLY THESE MEDICATIONS THE MORNING OF SURGERY WITH A SIP OF  WATER:  fenofibrate 160 MG tablet  gabapentin (NEURONTIN) 600 MG   famotidine (PEPCID) 20 MG tablet  pravastatin (PRAVACHOL) 10 MG tablet   No Alcohol for 24 hours before or after surgery.  No Smoking including e-cigarettes for 24 hours before surgery.  No chewable tobacco products for at least 6 hours before surgery.  No nicotine patches on the day of surgery.  Do not use any "recreational" drugs for at least a week (preferably 2 weeks) before your surgery.  Please be advised that the combination of cocaine and anesthesia may have negative outcomes, up to and including death. If you test positive for cocaine, your surgery will be cancelled.  On the morning of surgery brush your teeth with toothpaste and water, you may rinse your mouth with mouthwash if you wish. Do not swallow any toothpaste or mouthwash.  Use CHG Soap or wipes as directed on instruction sheet.  Do not wear lotions, powders, or perfumes.   Do not shave body hair from the neck down 48 hours before surgery.  Wear comfortable clothing (specific to your surgery type) to the hospital.  Do not wear jewelry, make-up, hairpins, clips or nail polish.  Contact lenses, hearing aids and dentures may not be worn into surgery.  Bring your C-PAP to the hospital in case you may have to spend the night.   Do not bring valuables to the hospital. [facility] is not responsible for any missing/lost belongings or valuables.   Notify your doctor if there is any change in your medical condition (cold, fever,  infection).  If you are being discharged the day of surgery, you will not be allowed to drive home. You will need a responsible individual to drive you home and stay with you for 24 hours after surgery.   If you are taking public transportation, you will need to have a responsible individual with you.  If you are being admitted to the hospital overnight, leave your suitcase in the car. After surgery it may be brought to your  room.  In case of increased patient census, it may be necessary for you, the patient, to continue your postoperative care in the Same Day Surgery department.  After surgery, you can help prevent lung complications by doing breathing exercises.  Take deep breaths and cough every 1-2 hours. Your doctor may order a device called an Incentive Spirometer to help you take deep breaths. When coughing or sneezing, hold a pillow firmly against your incision with both hands. This is called "splinting." Doing this helps protect your incision. It also decreases belly discomfort.  Surgery Visitation Policy:  Patients undergoing a surgery or procedure may have two family members or support persons with them as long as the person is not COVID-19 positive or experiencing its symptoms.   Inpatient Visitation:    Visiting hours are 7 a.m. to 8 p.m. Up to four visitors are allowed at one time in a patient room. The visitors may rotate out with other people during the day. One designated support person (adult) may remain overnight.  Please call the Pre-admissions Testing Dept. at [phone] if you have any questions about these instructions.     Preparing for Surgery with CHLORHEXIDINE GLUCONATE (CHG) Soap  Chlorhexidine Gluconate (CHG) Soap  o An antiseptic cleaner that kills germs and bonds with the skin to continue killing germs even after washing  o Used for showering the night before surgery and morning of surgery  Before surgery, you can play an important role by reducing the number of germs on your skin.  CHG (Chlorhexidine gluconate) soap is an antiseptic cleanser which kills germs and bonds with the skin to continue killing germs even after washing.  Please do not use if you have an allergy to CHG or antibacterial soaps. If your skin becomes reddened/irritated stop using the CHG.  1. Shower the NIGHT BEFORE SURGERY and the MORNING OF SURGERY with CHG soap.  2. If you choose to wash your  hair, wash your hair first as usual with your normal shampoo.  3. After shampooing, rinse your hair and body thoroughly to remove the shampoo.  4. Use CHG as you would any other liquid soap. You can apply CHG directly to the skin and wash gently with a scrungie or a clean washcloth.  5. Apply the CHG soap to your body only from the neck down. Do not use on open wounds or open sores. Avoid contact with your eyes, ears, mouth, and genitals (private parts). Wash face and genitals (private parts) with your normal soap.  6. Wash thoroughly, paying special attention to the area where your surgery will be performed.  7. Thoroughly rinse your body with warm water.  8. Do not shower/wash with your normal soap after using and rinsing off the CHG soap.  9. Pat yourself dry with a clean towel.  10. Wear clean pajamas to bed the night before surgery.  12. Place clean sheets on your bed the night of your first shower and do not sleep with pets.  13. Shower again with the  CHG soap on the day of surgery prior to arriving at the hospital.  14. Do not apply any deodorants/lotions/powders.  15. Please wear clean clothes to the hospital.

## 2022-07-23 ENCOUNTER — Encounter
Admission: RE | Admit: 2022-07-23 | Discharge: 2022-07-23 | Disposition: A | Discharge status: Home / Self Care | Payer: PPO | Source: Ambulatory Visit | Attending: Surgery | Admitting: Surgery

## 2022-07-23 ENCOUNTER — Encounter: Payer: Self-pay | Admitting: Urgent Care

## 2022-07-23 DIAGNOSIS — I10 Essential (primary) hypertension: Secondary | ICD-10-CM | POA: Insufficient documentation

## 2022-07-23 DIAGNOSIS — G4733 Obstructive sleep apnea (adult) (pediatric): Secondary | ICD-10-CM | POA: Insufficient documentation

## 2022-07-23 DIAGNOSIS — M25451 Effusion, right hip: Secondary | ICD-10-CM | POA: Diagnosis not present

## 2022-07-23 DIAGNOSIS — Z01810 Encounter for preprocedural cardiovascular examination: Secondary | ICD-10-CM | POA: Insufficient documentation

## 2022-07-23 DIAGNOSIS — G8929 Other chronic pain: Secondary | ICD-10-CM | POA: Diagnosis not present

## 2022-07-23 DIAGNOSIS — M1611 Unilateral primary osteoarthritis, right hip: Secondary | ICD-10-CM | POA: Diagnosis not present

## 2022-07-27 ENCOUNTER — Other Ambulatory Visit: Payer: PPO

## 2022-07-29 MED ORDER — CEFAZOLIN IN SODIUM CHLORIDE 3-0.9 GM/100ML-% IV SOLN
3.0000 g | INTRAVENOUS | Status: DC
  Filled 2022-07-29: qty 100

## 2022-07-30 ENCOUNTER — Ambulatory Visit: Payer: PPO | Admitting: Anesthesiology

## 2022-07-30 ENCOUNTER — Other Ambulatory Visit: Payer: Self-pay

## 2022-07-30 ENCOUNTER — Encounter
Admission: RE | Disposition: A | Discharge status: Home / Self Care | Payer: Self-pay | Source: Home / Self Care | Attending: Surgery

## 2022-07-30 ENCOUNTER — Encounter: Payer: Self-pay | Admitting: Surgery

## 2022-07-30 ENCOUNTER — Ambulatory Visit
Admission: RE | Admit: 2022-07-30 | Discharge: 2022-07-30 | Disposition: A | Discharge status: Home / Self Care | Payer: PPO | Attending: Surgery | Admitting: Surgery

## 2022-07-30 DIAGNOSIS — R200 Anesthesia of skin: Secondary | ICD-10-CM | POA: Insufficient documentation

## 2022-07-30 DIAGNOSIS — G4730 Sleep apnea, unspecified: Secondary | ICD-10-CM | POA: Insufficient documentation

## 2022-07-30 DIAGNOSIS — I10 Essential (primary) hypertension: Secondary | ICD-10-CM | POA: Diagnosis not present

## 2022-07-30 DIAGNOSIS — I712 Thoracic aortic aneurysm, without rupture, unspecified: Secondary | ICD-10-CM | POA: Diagnosis not present

## 2022-07-30 DIAGNOSIS — K219 Gastro-esophageal reflux disease without esophagitis: Secondary | ICD-10-CM | POA: Diagnosis not present

## 2022-07-30 DIAGNOSIS — Z981 Arthrodesis status: Secondary | ICD-10-CM | POA: Diagnosis not present

## 2022-07-30 DIAGNOSIS — G4733 Obstructive sleep apnea (adult) (pediatric): Secondary | ICD-10-CM | POA: Diagnosis not present

## 2022-07-30 DIAGNOSIS — M94261 Chondromalacia, right knee: Secondary | ICD-10-CM | POA: Insufficient documentation

## 2022-07-30 DIAGNOSIS — Q741 Congenital malformation of knee: Secondary | ICD-10-CM | POA: Insufficient documentation

## 2022-07-30 DIAGNOSIS — Z6841 Body Mass Index (BMI) 40.0 and over, adult: Secondary | ICD-10-CM | POA: Insufficient documentation

## 2022-07-30 DIAGNOSIS — M1711 Unilateral primary osteoarthritis, right knee: Secondary | ICD-10-CM | POA: Diagnosis not present

## 2022-07-30 HISTORY — PX: KNEE ARTHROSCOPY: SHX127

## 2022-07-30 SURGERY — ARTHROSCOPY, KNEE
Anesthesia: General | Site: Knee | Laterality: Right

## 2022-07-30 MED ORDER — ORAL CARE MOUTH RINSE: 15.0000 mL | Freq: Once | OROMUCOSAL | Status: AC

## 2022-07-30 MED ORDER — SODIUM CHLORIDE 0.9 % IV SOLN: INTRAVENOUS | Status: DC

## 2022-07-30 MED ORDER — PROMETHAZINE HCL 25 MG/ML IJ SOLN: 6.2500 mg | INTRAMUSCULAR | Status: DC | PRN

## 2022-07-30 MED ORDER — LACTATED RINGERS IV SOLN: INTRAVENOUS | Status: DC

## 2022-07-30 MED ORDER — ACETAMINOPHEN 10 MG/ML IV SOLN
INTRAVENOUS | Status: AC
  Filled 2022-07-30: qty 100

## 2022-07-30 MED ORDER — FENTANYL CITRATE (PF) 100 MCG/2ML IJ SOLN
INTRAMUSCULAR | Status: DC | PRN
  Administered 2022-07-30: 50 ug via INTRAVENOUS

## 2022-07-30 MED ORDER — BUPIVACAINE HCL (PF) 0.5 % IJ SOLN
INTRAMUSCULAR | Status: AC
  Filled 2022-07-30: qty 30

## 2022-07-30 MED ORDER — GLYCOPYRROLATE 0.2 MG/ML IJ SOLN
INTRAMUSCULAR | Status: DC | PRN
  Administered 2022-07-30: .2 mg via INTRAVENOUS

## 2022-07-30 MED ORDER — SUCCINYLCHOLINE CHLORIDE 200 MG/10ML IV SOSY
PREFILLED_SYRINGE | INTRAVENOUS | Status: DC | PRN
  Administered 2022-07-30: 140 mg via INTRAVENOUS

## 2022-07-30 MED ORDER — METOCLOPRAMIDE HCL 10 MG PO TABS: 5.0000 mg | ORAL_TABLET | Freq: Three times a day (TID) | ORAL | Status: DC | PRN

## 2022-07-30 MED ORDER — KETOROLAC TROMETHAMINE 15 MG/ML IJ SOLN: 15.0000 mg | Freq: Once | INTRAMUSCULAR | Status: DC

## 2022-07-30 MED ORDER — ACETAMINOPHEN 10 MG/ML IV SOLN: 1000.0000 mg | Freq: Once | INTRAVENOUS | Status: DC | PRN

## 2022-07-30 MED ORDER — EPINEPHRINE PF 1 MG/ML IJ SOLN
INTRAMUSCULAR | Status: AC
  Filled 2022-07-30: qty 1

## 2022-07-30 MED ORDER — ACETAMINOPHEN 10 MG/ML IV SOLN
INTRAVENOUS | Status: DC | PRN
  Administered 2022-07-30: 1000 mg via INTRAVENOUS

## 2022-07-30 MED ORDER — ONDANSETRON HCL 4 MG PO TABS: 4.0000 mg | ORAL_TABLET | Freq: Four times a day (QID) | ORAL | Status: DC | PRN

## 2022-07-30 MED ORDER — PROPOFOL 10 MG/ML IV BOLUS
INTRAVENOUS | Status: DC | PRN
  Administered 2022-07-30: 200 mg via INTRAVENOUS

## 2022-07-30 MED ORDER — CHLORHEXIDINE GLUCONATE 0.12 % MT SOLN
OROMUCOSAL | Status: AC
  Filled 2022-07-30: qty 15

## 2022-07-30 MED ORDER — MIDAZOLAM HCL 2 MG/2ML IJ SOLN
INTRAMUSCULAR | Status: DC | PRN
  Administered 2022-07-30: 2 mg via INTRAVENOUS

## 2022-07-30 MED ORDER — OXYCODONE HCL 5 MG/5ML PO SOLN: 5.0000 mg | Freq: Once | ORAL | Status: AC | PRN

## 2022-07-30 MED ORDER — ROCURONIUM BROMIDE 100 MG/10ML IV SOLN
INTRAVENOUS | Status: DC | PRN
  Administered 2022-07-30: 30 mg via INTRAVENOUS

## 2022-07-30 MED ORDER — METOCLOPRAMIDE HCL 5 MG/ML IJ SOLN: 5.0000 mg | Freq: Three times a day (TID) | INTRAMUSCULAR | Status: DC | PRN

## 2022-07-30 MED ORDER — FENTANYL CITRATE (PF) 100 MCG/2ML IJ SOLN
INTRAMUSCULAR | Status: AC
  Filled 2022-07-30: qty 2

## 2022-07-30 MED ORDER — BUPIVACAINE-EPINEPHRINE (PF) 0.5% -1:200000 IJ SOLN
INTRAMUSCULAR | Status: DC | PRN
  Administered 2022-07-30 (×2): 30 mL

## 2022-07-30 MED ORDER — LIDOCAINE HCL 1 % IJ SOLN
INTRAMUSCULAR | Status: DC | PRN
  Administered 2022-07-30: 30 mL

## 2022-07-30 MED ORDER — OXYCODONE HCL 5 MG PO TABS
5.0000 mg | ORAL_TABLET | Freq: Once | ORAL | Status: AC | PRN
  Administered 2022-07-30: 5 mg via ORAL

## 2022-07-30 MED ORDER — GABAPENTIN 300 MG PO CAPS: 300.0000 mg | ORAL_CAPSULE | Freq: Every day | ORAL | Status: AC

## 2022-07-30 MED ORDER — OXYCODONE HCL 5 MG PO TABS
ORAL_TABLET | ORAL | Status: AC
  Filled 2022-07-30: qty 1

## 2022-07-30 MED ORDER — ONDANSETRON HCL 4 MG/2ML IJ SOLN: 4.0000 mg | Freq: Four times a day (QID) | INTRAMUSCULAR | Status: DC | PRN

## 2022-07-30 MED ORDER — CHLORHEXIDINE GLUCONATE 0.12 % MT SOLN
15.0000 mL | Freq: Once | OROMUCOSAL | Status: AC
  Administered 2022-07-30: 15 mL via OROMUCOSAL

## 2022-07-30 MED ORDER — GABAPENTIN 300 MG PO CAPS: 300.0000 mg | ORAL_CAPSULE | Freq: Every day | ORAL | Status: DC

## 2022-07-30 MED ORDER — SUGAMMADEX SODIUM 200 MG/2ML IV SOLN
INTRAVENOUS | Status: DC | PRN
  Administered 2022-07-30: 100 mg via INTRAVENOUS

## 2022-07-30 MED ORDER — RINGERS IRRIGATION IR SOLN
Status: DC | PRN
  Administered 2022-07-30: 3000 mL

## 2022-07-30 MED ORDER — ACETAMINOPHEN 325 MG PO TABS: 325.0000 mg | ORAL_TABLET | Freq: Four times a day (QID) | ORAL | Status: DC | PRN

## 2022-07-30 MED ORDER — LIDOCAINE HCL (CARDIAC) PF 100 MG/5ML IV SOSY
PREFILLED_SYRINGE | INTRAVENOUS | Status: DC | PRN
  Administered 2022-07-30: 100 mg via INTRAVENOUS

## 2022-07-30 MED ORDER — LIDOCAINE HCL (PF) 1 % IJ SOLN
INTRAMUSCULAR | Status: AC
  Filled 2022-07-30: qty 30

## 2022-07-30 MED ORDER — OXYCODONE HCL 5 MG PO TABS: 5.0000 mg | ORAL_TABLET | ORAL | 0 refills | Status: DC | PRN

## 2022-07-30 MED ORDER — DROPERIDOL 2.5 MG/ML IJ SOLN: 0.6250 mg | Freq: Once | INTRAMUSCULAR | Status: DC | PRN

## 2022-07-30 MED ORDER — MIDAZOLAM HCL 2 MG/2ML IJ SOLN
INTRAMUSCULAR | Status: AC
  Filled 2022-07-30: qty 2

## 2022-07-30 MED ORDER — OXYCODONE HCL 5 MG PO TABS: 5.0000 mg | ORAL_TABLET | ORAL | Status: DC | PRN

## 2022-07-30 MED ORDER — CEFAZOLIN IN SODIUM CHLORIDE 3-0.9 GM/100ML-% IV SOLN
3.0000 g | INTRAVENOUS | Status: AC
  Administered 2022-07-30: 3 g via INTRAVENOUS
  Filled 2022-07-30: qty 100

## 2022-07-30 MED ORDER — FENTANYL CITRATE (PF) 100 MCG/2ML IJ SOLN: 25.0000 ug | INTRAMUSCULAR | Status: DC | PRN

## 2022-07-30 MED ORDER — ONDANSETRON HCL 4 MG/2ML IJ SOLN
INTRAMUSCULAR | Status: DC | PRN
  Administered 2022-07-30 (×2): 4 mg via INTRAVENOUS

## 2022-07-30 MED ORDER — DEXAMETHASONE SODIUM PHOSPHATE 10 MG/ML IJ SOLN
INTRAMUSCULAR | Status: DC | PRN
  Administered 2022-07-30: 10 mg via INTRAVENOUS

## 2022-07-30 SURGICAL SUPPLY — 55 items
ANCH SUT 2 2.9 2 LD TPR NDL (Anchor) ×1 IMPLANT
ANCHOR JUGGERKNOT WTAP NDL 2.9 (Anchor) IMPLANT
APL PRP STRL LF DISP 70% ISPRP (MISCELLANEOUS) ×2
BIT DRILL JUGRKNT W/NDL BIT2.9 (DRILL) IMPLANT
BLADE FULL RADIUS 3.5 (BLADE) ×1 IMPLANT
BLADE SHAVER 4.5X7 STR FR (MISCELLANEOUS) ×1 IMPLANT
BNDG CMPR 5X62 HK CLSR LF (GAUZE/BANDAGES/DRESSINGS) ×1
BNDG ELASTIC 6INX 5YD STR LF (GAUZE/BANDAGES/DRESSINGS) ×1 IMPLANT
BNDG ESMARCH 6 X 12 STRL LF (GAUZE/BANDAGES/DRESSINGS) ×1
BNDG ESMARCH 6X12 STRL LF (GAUZE/BANDAGES/DRESSINGS) ×1 IMPLANT
BRACE KNEE POST OP SHORT (BRACE) IMPLANT
CATH ROBINSON RED A/P 12FR (CATHETERS) IMPLANT
CHLORAPREP W/TINT 26 (MISCELLANEOUS) ×1 IMPLANT
COLLECTOR GRAFT TISSUE (SYSTAGENIX WOUND MANAGEMENT)
CUFF TOURN SGL QUICK 24 (TOURNIQUET CUFF)
CUFF TOURN SGL QUICK 34 (TOURNIQUET CUFF)
CUFF TRNQT CYL 24X4X16.5-23 (TOURNIQUET CUFF) IMPLANT
CUFF TRNQT CYL 34X4.125X (TOURNIQUET CUFF) IMPLANT
CUP MEDICINE 2OZ PLAST GRAD ST (MISCELLANEOUS) ×1 IMPLANT
DRAPE ARTHRO LIMB 89X125 STRL (DRAPES) ×1 IMPLANT
DRAPE IMP U-DRAPE 54X76 (DRAPES) ×1 IMPLANT
DRILL JUGGERKNOT W/NDL BIT 2.9 (DRILL) ×1
ELECT REM PT RETURN 9FT ADLT (ELECTROSURGICAL) ×1
ELECTRODE REM PT RTRN 9FT ADLT (ELECTROSURGICAL) ×1 IMPLANT
GAUZE SPONGE 4X4 12PLY STRL (GAUZE/BANDAGES/DRESSINGS) ×1 IMPLANT
GLOVE BIO SURGEON STRL SZ8 (GLOVE) ×2 IMPLANT
GLOVE INDICATOR 8.0 STRL GRN (GLOVE) ×1 IMPLANT
GOWN STRL REUS W/ TWL LRG LVL3 (GOWN DISPOSABLE) ×1 IMPLANT
GOWN STRL REUS W/ TWL XL LVL3 (GOWN DISPOSABLE) ×2 IMPLANT
GOWN STRL REUS W/TWL LRG LVL3 (GOWN DISPOSABLE) ×1
GOWN STRL REUS W/TWL XL LVL3 (GOWN DISPOSABLE) ×2
IV LACTATED RINGER IRRG 3000ML (IV SOLUTION) ×1
IV LACTATED RINGERS 1000ML (IV SOLUTION) IMPLANT
IV LR IRRIG 3000ML ARTHROMATIC (IV SOLUTION) ×1 IMPLANT
KIT TURNOVER KIT A (KITS) ×1 IMPLANT
MANIFOLD NEPTUNE II (INSTRUMENTS) ×2 IMPLANT
NDL HYPO 21X1.5 SAFETY (NEEDLE) ×1 IMPLANT
NEEDLE HYPO 21X1.5 SAFETY (NEEDLE) ×1 IMPLANT
PACK ARTHROSCOPY KNEE (MISCELLANEOUS) ×1 IMPLANT
SLEEVE REMOTE CONTROL 5X12 (DRAPES) IMPLANT
STAPLER SKIN PROX 35W (STAPLE) IMPLANT
SUT PROLENE 4 0 PS 2 18 (SUTURE) ×1 IMPLANT
SUT VIC AB 0 CT1 36 (SUTURE) IMPLANT
SUT VIC AB 2-0 CT1 (SUTURE) IMPLANT
SUT VIC AB 4-0 SH 27 (SUTURE) ×1
SUT VIC AB 4-0 SH 27XANBCTRL (SUTURE) IMPLANT
SYR 30ML LL (SYRINGE) ×1 IMPLANT
SYR 50ML LL SCALE MARK (SYRINGE) ×1 IMPLANT
SYR BULB IRRIG 60ML STRL (SYRINGE) IMPLANT
SYR TOOMEY 50ML (SYRINGE) IMPLANT
TISSUE GRAFT COLLECTOR (SYSTAGENIX WOUND MANAGEMENT) IMPLANT
TRAP FLUID SMOKE EVACUATOR (MISCELLANEOUS) ×1 IMPLANT
TUBE SET DOUBLEFLO INFLOW (TUBING) ×1 IMPLANT
WAND WEREWOLF FLOW 90D (MISCELLANEOUS) ×1 IMPLANT
WATER STERILE IRR 500ML POUR (IV SOLUTION) ×1 IMPLANT

## 2022-07-30 NOTE — H&P (Signed)
History of Present Illness: The patient returns for follow-up of his right anterior knee pain secondary to a symptomatic bipartite patella. The symptoms began many years ago and developed without a specific cause or injury. He apparently saw Baldwin Jamaica, PA-C, about 6 months ago and received a steroid injection which helped for only 2 to 3 weeks before his symptoms recurred. The patient noticed a significant increase in his symptoms over the Christmas holidays, prompting him to make this appointment. He reports 3/10 pain. The pain is located along the anterior aspect of the knee. The pain is described as aching, boring, stabbing, and throbbing. The symptoms are aggravated using stairs, at higher levels of activity, and rising from a chair. He also describes no mechanical symptoms. He has associated swelling and deformity. He has tried acetaminophen, over-the-counter medications, bracing, steroid injections, ice, and heat with limited benefit.   The patient was last seen for these symptoms 8 weeks ago. He notes little change in his symptoms since his last visit. He is quite frustrated by the symptoms and is ready to discuss more aggressive treatment options.  Current Outpatient Medications: diazePAM (VALIUM) 5 MG tablet 1-2 30 minutes before procedure 2 tablet 0  famotidine (PEPCID) 20 MG tablet TAKE ONE TABLET EVERY DAY 90 tablet 1  fenofibrate 160 MG tablet TAKE ONE TABLET EVERY DAY 90 tablet 1  FEXOFENADINE HCL (ALLEGRA ORAL) Take by mouth One daily  gabapentin (NEURONTIN) 300 MG capsule TAKE 1 CAPSULE BY MOUTH 5 TIMES DAILY. 450 capsule 3  hydroCHLOROthiazide (HYDRODIURIL) 12.5 MG tablet TAKE ONE TABLET EVERY DAY 90 tablet 1  losartan (COZAAR) 50 MG tablet TAKE ONE TABLET EVERY DAY 90 tablet 1  pantoprazole (PROTONIX) 40 MG DR tablet Take 1 tablet (40 mg total) by mouth once daily 1 at night for 30days 90 tablet 3  pravastatin (PRAVACHOL) 10 MG tablet Take 1 tablet (10 mg total) by mouth once daily 90  tablet 3  tiZANidine (ZANAFLEX) 4 MG tablet TAKE ONE TABLET 3 TIMES DAILY AS NEEDED 270 tablet 1  traZODone (DESYREL) 50 MG tablet Take 1 tablet (50 mg total) by mouth at bedtime as needed for Sleep 30 tablet 11   Allergies:  Crestor [Rosuvastatin] Other (Joint pain)   Past Medical History:  Allergic state  allergic rhinitis  Aneurysm of ascending aorta without rupture (CMS-HCC) 09/24/2021  4.1 cm by CTA 6/23; yearly monitoring recommended  Arthritis (h/o orthopedics evaluation; DDD on CT 8/15)  Atypical chest pain  Myoview 8/14 negative for ischemia.  Epigastric pain 05/2009  Myoview negative for ischemia. Echo with normal heart function, mild pulmonary hypertension. Improved with H2 blockers  GERD (gastroesophageal reflux disease)  Gout  pt denies  History of cancer do not recall  Skin cancers  Hyperglycemia 02/15/2014  Hyperlipidemia (did not tolerate Crestor)  Hypertension  Numbness of toes (bilateral, consistent with peripheral neuropathy, likely from arthritis. B-12, TSH previously unremarkable).  Sleep apnea (on bipap)   Past Surgical History:  COLONOSCOPY N/A 05/14/2015  (10/15/09) Dr. Maggie Font @ [facility] [place]: CBF 05/2020  BACK SURGERY 2019  POSTERIOR LAMINECTOMY / DECOMPRESSION LUMBAR SPINE 12/2017  L5-S1; Dr. Dutch Quint  EXTRACTION CATARACT EXTRACAPSULAR W/INSERTION INTRAOCULAR PROSTHESIS Left 06/15/2019  Procedure: L- LENSX - EXTRACTION CATARACT EXTRACAPSULAR WITH PHACO WITH INSERTION INTRAOCULAR PROSTHESIS; Surgeon: Kizzie Fantasia, MD; Location: [facility] OR; Service: Ophthalmology; Laterality: Left;  EXTRACTION CATARACT EXTRACAPSULAR W/INSERTION INTRAOCULAR PROSTHESIS Right 07/06/2019  Procedure: R- LENSX - EXTRACTION CATARACT EXTRACAPSULAR WITH PHACO WITH INSERTION INTRAOCULAR PROSTHESIS; Surgeon: Alford Highland,  Lorenza Chick, MD; Location: [facility] OR; Service: Ophthalmology; Laterality: Right;  C5-6, C6-7 anterior cervical discectomy with interbody fusion utilizing interbody  cages, locally harvested autograft, and anterior plate instrumentation 12/24/2021  APPENDECTOMY  ARTHROSCOPIC ROTATOR CUFF REPAIR Right  HERNIA REPAIR (inguinal hernia x 2)  SKIN BIOPSY  TONSILLECTOMY   Family History:  Alzheimer's disease Mother  Osteoporosis (Thinning of bones) Mother  Other Father  Bladder polyps  Colon polyps Father  Glaucoma Neg Hx  Macular degeneration Neg Hx   Social History:   Socioeconomic History:  Marital status: Married  Tobacco Use  Smoking status: Never  Smokeless tobacco: Never  Vaping Use  Vaping Use: Never used  Substance and Sexual Activity  Alcohol use: Not Currently  Alcohol/week: 1.0 standard drink of alcohol  Types: 1 Cans of beer per week  Comment: occasionally  Drug use: Never  Sexual activity: Never  Partners: Female  Social History Narrative  Retired with part time job.   Review of Systems:  A comprehensive 14 point ROS was performed, reviewed, and the pertinent orthopaedic findings are documented in the HPI.  Physical Exam: Vitals:  07/07/22 0817  BP: 128/82  Weight: (!) 133.9 kg (295 lb 3.2 oz)  Height: 177.8 cm (5\' 10" )  PainSc: 4  PainLoc: Hip   General/Constitutional: Pleasant significantly overweight middle-age male in no acute distress. Neuro/Psych: Normal mood and affect, oriented to person, place and time. Eyes: Non-icteric. Pupils are equal, round, and reactive to light, and exhibit synchronous movement. ENT: Unremarkable. Lymphatic: No palpable adenopathy. Respiratory: Lungs clear to auscultation, Normal chest excursion, No wheezes, and Non-labored breathing Cardiovascular: Regular rate and rhythm. No murmurs. and No edema, swelling or tenderness, except as noted in detailed exam. Integumentary: No impressive skin lesions present, except as noted in detailed exam. Musculoskeletal: Unremarkable, except as noted in detailed exam.  Right knee exam: GAIT: Essentially normal and uses no assistive  devices. ALIGNMENT: Normal SKIN: Notable for a firm prominence over the superolateral portion of patella, otherwise unremarkable SWELLING: Mild focal swelling over superolateral portion of the patella EFFUSION: At most a trace WARMTH: None TENDERNESS: Mild-moderate focal tenderness over firm prominence involving superolateral portion of patella and minimal tenderness over tibial tubercle, but no medial or lateral joint line tenderness ROM: Full with minimal discomfort anteriorly in maximal flexion McMURRAY'S: Negative PATELLOFEMORAL: Normal tracking with negative apprehension sign CREPITUS: Fine patellofemoral crepitance LACHMAN'S: Negative PIVOT SHIFT: Negative ANTERIOR DRAWER: Negative POSTERIOR DRAWER: Negative VARUS/VALGUS: Stable  He is neurovascularly intact to both lower extremities, other than slightly decreased strength with dorsiflexion, plantarflexion, inversion, and eversion of the right foot. He has negative sitting straight leg raises bilaterally.  X-rays/MRI/Lab data:  Recent AP weightbearing of both knees, as well as lateral and merchant views of the right knee are available for review and have been reviewed by myself. These films demonstrate mild degenerative changes, primarily involving the medial and patellofemoral compartments with 10% medial joint space narrowing. Overall alignment is neutral. The films also are notable for a bipartite patella. No fractures, lytic lesions, or abnormal calcifications are noted.   Assessment: 1. Symptomatic bipartite patella, right knee.  2. Primary osteoarthritis of right knee.   Plan: The treatment options were discussed with the patient. In addition, patient educational materials were provided regarding the diagnosis and treatment options. Regarding his continued anterior right knee symptoms, the patient is quite frustrated by his persistent symptoms and functional limitations, and is ready to consider more aggressive treatment  options. Therefore, I have recommended a surgical  procedure, specifically a right knee scope with debridement, and excision of the symptomatic bipartite patella fragment. The procedure was discussed with the patient, as were the potential risks (including bleeding, infection, nerve and/or blood vessel injury, persistent or recurrent pain, loosening and/or failure of the components, dislocation, need for further surgery, blood clots, strokes, heart attacks and/or arhythmias, pneumonia, etc.) and benefits. The patient states his understanding and wishes to proceed. All of the patient's questions and concerns were answered. He can call any time with further concerns. He will follow up post-surgery, routine.    H&P reviewed and patient re-examined. No changes.

## 2022-07-30 NOTE — Transfer of Care (Signed)
Immediate Anesthesia Transfer of Care Note  Patient: Rodney Clark  Procedure(s) Performed: RIGHT KNEE ARTHROSCOPY WITH DEBRIDEMENT AND EXCISION OF SYMPTOMATIC BIPARTITE FRAGMENT. (Right: Knee)  Patient Location: PACU  Anesthesia Type:General  Level of Consciousness: awake, drowsy, and patient cooperative  Airway & Oxygen Therapy: Patient Spontanous Breathing and Patient connected to face mask oxygen  Post-op Assessment: Report given to RN and Post -op Vital signs reviewed and stable  Post vital signs: Reviewed and stable  Last Vitals:  Vitals Value Taken Time  BP 141/86 07/30/22 1030  Temp 36.1 C 07/30/22 1025  Pulse 71 07/30/22 1031  Resp 24 07/30/22 1031  SpO2 97 % 07/30/22 1031  Vitals shown include unvalidated device data.  Last Pain:  Vitals:   07/30/22 1025  TempSrc:   PainSc: Asleep      Patients Stated Pain Goal: 0 (07/30/22 0719)  Complications: No notable events documented.

## 2022-07-30 NOTE — Discharge Instructions (Addendum)
AMBULATORY SURGERY  DISCHARGE INSTRUCTIONS   The drugs that you were given will stay in your system until tomorrow so for the next 24 hours you should not:  Drive an automobile Make any legal decisions Drink any alcoholic beverage   You may resume regular meals tomorrow.  Today it is better to start with liquids and gradually work up to solid foods.  You may eat anything you prefer, but it is better to start with liquids, then soup and crackers, and gradually work up to solid foods.   Please notify your doctor immediately if you have any unusual bleeding, trouble breathing, redness and pain at the surgery site, drainage, fever, or pain not relieved by medication.    Additional Instructions:  Orthopedic discharge instructions: Keep dressing dry and intact.  May shower after dressing changed on post-op day #4 (Sunday).  Cover staples with Band-Aids after drying off. Apply ice frequently to knee. Take oxycodone as prescribed when needed.  May supplement with ES Tylenol if necessary. May weight-bear as tolerated so long as in brace locked in extension - use crutches or walker as needed. Follow-up in 10-14 days or as scheduled.   Please contact your physician with any problems or Same Day Surgery at [phone], Monday through Friday 6 am to 4 pm, or [place] at [facility] number at [phone].

## 2022-07-30 NOTE — Op Note (Signed)
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  Postoperative diagnosis:   Same  Procedure:   1. Arthroscopic debridement with abrasion chondroplasty of patella, right knee.  2. Open excision of symptomatic bipartite patellar fragment, right knee.  Surgeon:   Maryagnes Amos, MD  Anesthesia:   GET  Findings:   As above.  There were grade III chondromalacia changes noted diffusely through the femoral trochlear region, and grade II chondromalacial changes throughout the medial compartment.  The lateral compartment demonstrated grade I-II chondromalacia changes involving the tibial weightbearing surface.  The medial and lateral menisci were in satisfactory condition, as were the anterior and posterior cruciate ligaments.  Complications:   None  EBL:   5 cc.  Total fluids:   600 cc of crystalloid.  Tourniquet time:   None  Drains:   None  Closure:   4-0 Prolene interrupted sutures.  Brief clinical note:   The patient is a 67 year old male with a long history of anterior right knee pain. His symptoms have persisted despite medications, activity modification, etc. His history and examination are consistent with a symptomatic bipartite patella with underlying degenerative joint disease. The patient presents at this time for arthroscopy, debridement, and excision of the symptomatic bipartite fragment.  Procedure:   The patient was brought into the operating room and lain in the supine position. After adequate general endotracheal intubation and anesthesia was obtained, a timeout was performed to verify the appropriate side. The patient's right knee was injected sterilely using a solution of 30 cc of 1% lidocaine and 30 cc of 0.5% Sensorcaine with epinephrine. The right lower extremity was prepped with ChloraPrep solution before being draped sterilely. Preoperative antibiotics were  administered. The expected portal sites were injected with 0.5% Sensorcaine with epinephrine before the camera was placed in the anterolateral portal and instrumentation performed through the anteromedial portal.   The knee was sequentially examined beginning in the suprapatellar pouch, then progressing to the patellofemoral space, the medial gutter and compartment, the notch, and finally the lateral compartment and gutter. The findings were as described above. Abundant reactive synovial tissues anteriorly were debrided using the full-radius resector in order to improve visualization. The areas of grade III chondromalacia changes in the femoral trochlea were debrided back to stable margins using the full-radius resector.  In addition, the loose articular cartilage at the bipartite fragment fissure also were debrided back to stable margins both to improve symptoms as well as to better delineate the fragment. The medial and lateral menisci were probed and found to be stable, as were the anterior posterior cruciate ligaments. The instruments were removed from the joint after suctioning the excess fluid.   An approximately 3.5 to 4 cm longitudinal incision was made over the lateral half of the patella. This incision was carried down through the subcutaneous tissues and bursa to expose the quadriceps tendon. The tendon was incised longitudinally in line with its fibers to expose the bipartite fragment fissure. The bipartite fragment was scalloped out using sharp and blunt dissection to elevate the quadriceps tendon attachment off of it while maintaining integrity of the quadriceps tendon sleeve. The bipartite fragment was removed in its entirety and sent to pathology. The exposed portion of the patella remaining after the fragment was removed was roughened with a rongeur before a single [employer] 2.9 mm JuggerKnot anchor was inserted.   The two sets of sutures  were passed through the quadriceps tendon and tied  securely to repair this portion of the tendon. The quadriceps tendon was reapproximated using #0 Vicryl interrupted sutures before the bursal tissues were closed using 2-0 Vicryl interrupted sutures. The subcuticular layer also was closed using 2-0 Vicryl interrupted sutures before the skin was closed using staples.  The portal sites were closed using 4-0 Vicryl subcuticular sutures before the skin was closed using staples. A sterile bulky dressing was applied to the knee before the patient was placed into a hinged knee brace with the hinges set at 0-90 degrees but locked in extension to protect the quadriceps tendon repair. The patient was then awakened, extubated, and returned to the recovery room in satisfactory condition after tolerating the procedure well.

## 2022-07-30 NOTE — Anesthesia Procedure Notes (Signed)
Procedure Name: Intubation Date/Time: 07/30/2022 8:40 AM  Performed by: Mohammed Kindle, CRNAPre-anesthesia Checklist: Patient identified, Emergency Drugs available, Suction available and Patient being monitored Patient Re-evaluated:Patient Re-evaluated prior to induction Oxygen Delivery Method: Circle system utilized Preoxygenation: Pre-oxygenation with 100% oxygen Induction Type: IV induction Ventilation: Mask ventilation without difficulty Laryngoscope Size: McGraph and 3 Grade View: Grade I Tube type: Oral Tube size: 7.5 mm Number of attempts: 1 Airway Equipment and Method: Stylet and Oral airway Placement Confirmation: ETT inserted through vocal cords under direct vision, positive ETCO2, breath sounds checked- equal and bilateral and CO2 detector Secured at: 21 cm Tube secured with: Tape Dental Injury: Teeth and Oropharynx as per pre-operative assessment

## 2022-07-30 NOTE — Anesthesia Preprocedure Evaluation (Addendum)
Anesthesia Evaluation  Patient identified by MRN, date of birth, ID band Patient awake    Reviewed: Allergy & Precautions, NPO status , Patient's Chart, lab work & pertinent test results  Airway Mallampati: IV  TM Distance: >3 FB Neck ROM: Limited    Dental  (+) Missing   Pulmonary sleep apnea and Continuous Positive Airway Pressure Ventilation    Pulmonary exam normal        Cardiovascular hypertension,  Rhythm:Regular Rate:Normal  - Thoracic Aneurysm   Neuro/Psych S/p C5-6, C6-7 anterior cervical discectomy and fusion  Neuromuscular disease (R calf numbness)  negative psych ROS   GI/Hepatic Neg liver ROS,GERD  Controlled and Medicated,,  Endo/Other    Morbid obesity  Renal/GU negative Renal ROS     Musculoskeletal  (+) Arthritis ,    Abdominal  (+) + obese  Peds  Hematology negative hematology ROS (+)   Anesthesia Other Findings   Reproductive/Obstetrics                             Anesthesia Physical Anesthesia Plan  ASA: 3  Anesthesia Plan: General   Post-op Pain Management: Ofirmev IV (intra-op)* and Toradol IV (intra-op)*   Induction: Intravenous  PONV Risk Score and Plan: 3 and Ondansetron and Dexamethasone  Airway Management Planned: Oral ETT and Video Laryngoscope Planned  Additional Equipment: None  Intra-op Plan:   Post-operative Plan: Extubation in OR  Informed Consent: I have reviewed the patients History and Physical, chart, labs and discussed the procedure including the risks, benefits and alternatives for the proposed anesthesia with the patient or authorized representative who has indicated his/her understanding and acceptance.     Dental advisory given  Plan Discussed with: CRNA  Anesthesia Plan Comments:         Anesthesia Quick Evaluation

## 2022-07-30 NOTE — TOC Progression Note (Signed)
Transition of Care [facility]) - Progression Note    Patient Details  Name: Rodney Clark MRN: [id number] Date of Birth: 1956-01-25  [facility] of [facility] CM/SW Contact  Marlowe Sax, RN Phone Number: 07/30/2022, 11:36 AM  Clinical Narrative:     The patient was accepted for [facility] from [facility]        Expected Discharge Plan and Services         Expected Discharge Date: 07/30/22                                     Social Determinants of Health (SDOH) Interventions SDOH Screenings   Tobacco Use: Low Risk  (07/30/2022)    Readmission Risk Interventions     No data to display

## 2022-07-31 ENCOUNTER — Encounter: Payer: Self-pay | Admitting: Surgery

## 2022-07-31 DIAGNOSIS — I272 Pulmonary hypertension, unspecified: Secondary | ICD-10-CM | POA: Diagnosis not present

## 2022-07-31 DIAGNOSIS — M1711 Unilateral primary osteoarthritis, right knee: Secondary | ICD-10-CM | POA: Diagnosis not present

## 2022-07-31 DIAGNOSIS — I7121 Aneurysm of the ascending aorta, without rupture: Secondary | ICD-10-CM | POA: Diagnosis not present

## 2022-07-31 DIAGNOSIS — M21371 Foot drop, right foot: Secondary | ICD-10-CM | POA: Diagnosis not present

## 2022-07-31 DIAGNOSIS — Z9181 History of falling: Secondary | ICD-10-CM | POA: Diagnosis not present

## 2022-07-31 DIAGNOSIS — Z981 Arthrodesis status: Secondary | ICD-10-CM | POA: Diagnosis not present

## 2022-07-31 DIAGNOSIS — Z85828 Personal history of other malignant neoplasm of skin: Secondary | ICD-10-CM | POA: Diagnosis not present

## 2022-07-31 DIAGNOSIS — G4733 Obstructive sleep apnea (adult) (pediatric): Secondary | ICD-10-CM | POA: Diagnosis not present

## 2022-07-31 DIAGNOSIS — Z4789 Encounter for other orthopedic aftercare: Secondary | ICD-10-CM | POA: Diagnosis not present

## 2022-07-31 DIAGNOSIS — I10 Essential (primary) hypertension: Secondary | ICD-10-CM | POA: Diagnosis not present

## 2022-07-31 DIAGNOSIS — K219 Gastro-esophageal reflux disease without esophagitis: Secondary | ICD-10-CM | POA: Diagnosis not present

## 2022-07-31 DIAGNOSIS — Z9989 Dependence on other enabling machines and devices: Secondary | ICD-10-CM | POA: Diagnosis not present

## 2022-07-31 DIAGNOSIS — R7303 Prediabetes: Secondary | ICD-10-CM | POA: Diagnosis not present

## 2022-07-31 DIAGNOSIS — G629 Polyneuropathy, unspecified: Secondary | ICD-10-CM | POA: Diagnosis not present

## 2022-07-31 DIAGNOSIS — E785 Hyperlipidemia, unspecified: Secondary | ICD-10-CM | POA: Diagnosis not present

## 2022-07-31 DIAGNOSIS — J309 Allergic rhinitis, unspecified: Secondary | ICD-10-CM | POA: Diagnosis not present

## 2022-07-31 DIAGNOSIS — M519 Unspecified thoracic, thoracolumbar and lumbosacral intervertebral disc disorder: Secondary | ICD-10-CM | POA: Diagnosis not present

## 2022-07-31 DIAGNOSIS — M109 Gout, unspecified: Secondary | ICD-10-CM | POA: Diagnosis not present

## 2022-07-31 NOTE — Anesthesia Postprocedure Evaluation (Signed)
Anesthesia Post Note  Patient: Rodney Clark  Procedure(s) Performed: RIGHT KNEE ARTHROSCOPY WITH DEBRIDEMENT AND EXCISION OF SYMPTOMATIC BIPARTITE FRAGMENT. (Right: Knee)  Patient location during evaluation: PACU Anesthesia Type: General Level of consciousness: awake and alert Pain management: pain level controlled Vital Signs Assessment: post-procedure vital signs reviewed and stable Respiratory status: spontaneous breathing, nonlabored ventilation and respiratory function stable Cardiovascular status: blood pressure returned to baseline and stable Postop Assessment: no apparent nausea or vomiting Anesthetic complications: no   No notable events documented.   Last Vitals:  Vitals:   07/30/22 1105 07/30/22 1116  BP:  137/87  Pulse: 75 73  Resp: (!) 21 18  Temp:  (!) 36.1 C  SpO2: 94% 94%    Last Pain:  Vitals:   07/30/22 1116  TempSrc: Temporal  PainSc: 3                  Foye Deer

## 2022-08-01 LAB — SURGICAL PATHOLOGY

## 2022-08-18 DIAGNOSIS — M6281 Muscle weakness (generalized): Secondary | ICD-10-CM | POA: Diagnosis not present

## 2022-08-18 DIAGNOSIS — Z9889 Other specified postprocedural states: Secondary | ICD-10-CM | POA: Diagnosis not present

## 2022-08-18 DIAGNOSIS — M25561 Pain in right knee: Secondary | ICD-10-CM | POA: Diagnosis not present

## 2022-08-18 DIAGNOSIS — M25661 Stiffness of right knee, not elsewhere classified: Secondary | ICD-10-CM | POA: Diagnosis not present

## 2022-08-20 DIAGNOSIS — M25561 Pain in right knee: Secondary | ICD-10-CM | POA: Diagnosis not present

## 2022-08-20 DIAGNOSIS — M6281 Muscle weakness (generalized): Secondary | ICD-10-CM | POA: Diagnosis not present

## 2022-08-20 DIAGNOSIS — M25661 Stiffness of right knee, not elsewhere classified: Secondary | ICD-10-CM | POA: Diagnosis not present

## 2022-08-20 DIAGNOSIS — Z9889 Other specified postprocedural states: Secondary | ICD-10-CM | POA: Diagnosis not present

## 2022-08-22 ENCOUNTER — Other Ambulatory Visit: Payer: Self-pay | Admitting: Internal Medicine

## 2022-08-22 DIAGNOSIS — I10 Essential (primary) hypertension: Secondary | ICD-10-CM

## 2022-08-22 DIAGNOSIS — I7121 Aneurysm of the ascending aorta, without rupture: Secondary | ICD-10-CM

## 2022-08-27 ENCOUNTER — Ambulatory Visit: Payer: PPO | Admitting: Dermatology

## 2022-08-27 ENCOUNTER — Encounter: Payer: Self-pay | Admitting: Dermatology

## 2022-08-27 VITALS — BP 90/62 | HR 53

## 2022-08-27 DIAGNOSIS — X32XXXA Exposure to sunlight, initial encounter: Secondary | ICD-10-CM

## 2022-08-27 DIAGNOSIS — L578 Other skin changes due to chronic exposure to nonionizing radiation: Secondary | ICD-10-CM

## 2022-08-27 DIAGNOSIS — M25661 Stiffness of right knee, not elsewhere classified: Secondary | ICD-10-CM | POA: Diagnosis not present

## 2022-08-27 DIAGNOSIS — L570 Actinic keratosis: Secondary | ICD-10-CM | POA: Diagnosis not present

## 2022-08-27 DIAGNOSIS — Z8589 Personal history of malignant neoplasm of other organs and systems: Secondary | ICD-10-CM

## 2022-08-27 DIAGNOSIS — W908XXA Exposure to other nonionizing radiation, initial encounter: Secondary | ICD-10-CM | POA: Diagnosis not present

## 2022-08-27 DIAGNOSIS — Z9889 Other specified postprocedural states: Secondary | ICD-10-CM | POA: Diagnosis not present

## 2022-08-27 DIAGNOSIS — M6281 Muscle weakness (generalized): Secondary | ICD-10-CM | POA: Diagnosis not present

## 2022-08-27 DIAGNOSIS — L821 Other seborrheic keratosis: Secondary | ICD-10-CM | POA: Diagnosis not present

## 2022-08-27 DIAGNOSIS — L820 Inflamed seborrheic keratosis: Secondary | ICD-10-CM | POA: Diagnosis not present

## 2022-08-27 DIAGNOSIS — Z85828 Personal history of other malignant neoplasm of skin: Secondary | ICD-10-CM | POA: Diagnosis not present

## 2022-08-27 DIAGNOSIS — M25561 Pain in right knee: Secondary | ICD-10-CM | POA: Diagnosis not present

## 2022-08-27 NOTE — Patient Instructions (Addendum)
Actinic keratoses are precancerous spots that appear secondary to cumulative UV radiation exposure/sun exposure over time. They are chronic with expected duration over 1 year. A portion of actinic keratoses will progress to squamous cell carcinoma of the skin. It is not possible to reliably predict which spots will progress to skin cancer and so treatment is recommended to prevent development of skin cancer.  Recommend daily broad spectrum sunscreen SPF 30+ to sun-exposed areas, reapply every 2 hours as needed.  Recommend staying in the shade or wearing long sleeves, sun glasses (UVA+UVB protection) and wide brim hats (4-inch brim around the entire circumference of the hat). Call for new or changing lesions.   Cryotherapy Aftercare  Wash gently with soap and water everyday.   Apply Vaseline and Band-Aid daily until healed.   Seborrheic Keratosis  What causes seborrheic keratoses? Seborrheic keratoses are harmless, common skin growths that first appear during adult life.  As time goes by, more growths appear.  Some people may develop a large number of them.  Seborrheic keratoses appear on both covered and uncovered body parts.  They are not caused by sunlight.  The tendency to develop seborrheic keratoses can be inherited.  They vary in color from skin-colored to gray, brown, or even black.  They can be either smooth or have a rough, warty surface.   Seborrheic keratoses are superficial and look as if they were stuck on the skin.  Under the microscope this type of keratosis looks like layers upon layers of skin.  That is why at times the top layer may seem to fall off, but the rest of the growth remains and re-grows.    Treatment Seborrheic keratoses do not need to be treated, but can easily be removed in the office.  Seborrheic keratoses often cause symptoms when they rub on clothing or jewelry.  Lesions can be in the way of shaving.  If they become inflamed, they can cause itching, soreness, or  burning.  Removal of a seborrheic keratosis can be accomplished by freezing, burning, or surgery. If any spot bleeds, scabs, or grows rapidly, please return to have it checked, as these can be an indication of a skin cancer.          Due to recent changes in healthcare laws, you may see results of your pathology and/or laboratory studies on MyChart before the doctors have had a chance to review them. We understand that in some cases there may be results that are confusing or concerning to you. Please understand that not all results are received at the same time and often the doctors may need to interpret multiple results in order to provide you with the best plan of care or course of treatment. Therefore, we ask that you please give us 2 business days to thoroughly review all your results before contacting the office for clarification. Should we see a critical lab result, you will be contacted sooner.   If You Need Anything After Your Visit  If you have any questions or concerns for your doctor, please call our main line at [phone] and press option 4 to reach your doctor's medical assistant. If no one answers, please leave a voicemail as directed and we will return your call as soon as possible. Messages left after 4 pm will be answered the following business day.   You may also send us a message via MyChart. We typically respond to MyChart messages within 1-2 business days.  For prescription refills, please ask your pharmacy   to contact our office. Our fax number is [phone].  If you have an urgent issue when the clinic is closed that cannot wait until the next business day, you can page your doctor at the number below.    Please note that while we do our best to be available for urgent issues outside of office hours, we are not available 24/7.   If you have an urgent issue and are unable to reach us, you may choose to seek medical care at your doctor's office, retail clinic,  urgent care center, or emergency room.  If you have a medical emergency, please immediately call 911 or go to the emergency department.  Pager Numbers  - Dr. Kowalski: [phone]  - Dr. Moye: [phone]  - Dr. Stewart: [phone]  In the event of inclement weather, please call our main line at [phone] for an update on the status of any delays or closures.  Dermatology Medication Tips: Please keep the boxes that topical medications come in in order to help keep track of the instructions about where and how to use these. Pharmacies typically print the medication instructions only on the boxes and not directly on the medication tubes.   If your medication is too expensive, please contact our office at [phone] option 4 or send us a message through MyChart.   We are unable to tell what your co-pay for medications will be in advance as this is different depending on your insurance coverage. However, we may be able to find a substitute medication at lower cost or fill out paperwork to get insurance to cover a needed medication.   If a prior authorization is required to get your medication covered by your insurance company, please allow us 1-2 business days to complete this process.  Drug prices often vary depending on where the prescription is filled and some pharmacies may offer cheaper prices.  The website www.goodrx.com contains coupons for medications through different pharmacies. The prices here do not account for what the cost may be with help from insurance (it may be cheaper with your insurance), but the website can give you the Gad if you did not use any insurance.  - You can print the associated coupon and take it with your prescription to the pharmacy.  - You may also stop by our office during regular business hours and pick up a GoodRx coupon card.  - If you need your prescription sent electronically to a different pharmacy, notify our office through [place]  MyChart or by phone at [phone] option 4.     Si Usted Necesita Algo Despus de Su Visita  Tambin puede enviarnos un mensaje a travs de MyChart. Por lo general respondemos a los mensajes de MyChart en el transcurso de 1 a 2 das hbiles.  Para renovar recetas, por favor pida a su farmacia que se ponga en contacto con nuestra oficina. Nuestro nmero de fax es el [phone].  Si tiene un asunto urgente cuando la clnica est cerrada y que no puede esperar hasta el siguiente da hbil, puede llamar/localizar a su doctor(a) al nmero que aparece a continuacin.   Por favor, tenga en cuenta que aunque hacemos todo lo posible para estar disponibles para asuntos urgentes fuera del horario de oficina, no estamos disponibles las 24 horas del da, los 7 das de la semana.   Si tiene un problema urgente y no puede comunicarse con nosotros, puede optar por buscar atencin mdica  en el consultorio de su doctor(a), en una   clnica privada, en un centro de atencin urgente o en una sala de emergencias.  Si tiene una emergencia mdica, por favor llame inmediatamente al 911 o vaya a la sala de emergencias.  Nmeros de bper  - Dr. Kowalski: [phone]  - Dra. Moye: [phone]  - Dra. Stewart: [phone]  En caso de inclemencias del tiempo, por favor llame a nuestra lnea principal al [phone] para una actualizacin sobre el estado de cualquier retraso o cierre.  Consejos para la medicacin en dermatologa: Por favor, guarde las cajas en las que vienen los medicamentos de uso tpico para ayudarle a seguir las instrucciones sobre dnde y cmo usarlos. Las farmacias generalmente imprimen las instrucciones del medicamento slo en las cajas y no directamente en los tubos del medicamento.   Si su medicamento es muy caro, por favor, pngase en contacto con nuestra oficina llamando al [phone] y presione la opcin 4 o envenos un mensaje a travs de MyChart.   No podemos decirle cul  ser su copago por los medicamentos por adelantado ya que esto es diferente dependiendo de la cobertura de su seguro. Sin embargo, es posible que podamos encontrar un medicamento sustituto a menor costo o llenar un formulario para que el seguro cubra el medicamento que se considera necesario.   Si se requiere una autorizacin previa para que su compaa de seguros cubra su medicamento, por favor permtanos de 1 a 2 das hbiles para completar este proceso.  Los precios de los medicamentos varan con frecuencia dependiendo del lugar de dnde se surte la receta y alguna farmacias pueden ofrecer precios ms baratos.  El sitio web www.goodrx.com tiene cupones para medicamentos de diferentes farmacias. Los precios aqu no tienen en cuenta lo que podra costar con la ayuda del seguro (puede ser ms barato con su seguro), pero el sitio web puede darle el precio si no utiliz ningn seguro.  - Puede imprimir el cupn correspondiente y llevarlo con su receta a la farmacia.  - Tambin puede pasar por nuestra oficina durante el horario de atencin regular y recoger una tarjeta de cupones de GoodRx.  - Si necesita que su receta se enve electrnicamente a una farmacia diferente, informe a nuestra oficina a travs de MyChart de [facility] o por telfono llamando al [phone] y presione la opcin 4.  

## 2022-08-27 NOTE — Progress Notes (Signed)
Follow-Up Visit   Subjective  Rodney Clark is a 67 y.o. male who presents for the following: 6 month ak and isk follow up. Reports a rough spot at right wrist.  The patient has spots, moles and lesions to be evaluated, some may be new or changing and the patient may have concern these could be cancer.  The following portions of the chart were reviewed this encounter and updated as appropriate: medications, allergies, medical history  Review of Systems:  No other skin or systemic complaints except as noted in HPI or Assessment and Plan.  Objective  Well appearing patient in no apparent distress; mood and affect are within normal limits.  A focused examination was performed of the following areas: Face, scalp, ears, hands, arms  Relevant exam findings are noted in the Assessment and Plan.  face and scalp x 15 (15) Erythematous thin papules/macules with gritty scale.   right wrist/ right arm x 3 (3) Erythematous stuck-on, waxy papule or plaque   Assessment & Plan   Actinic keratosis (15) face and scalp x 15  Actinic keratoses are precancerous spots that appear secondary to cumulative UV radiation exposure/sun exposure over time. They are chronic with expected duration over 1 year. A portion of actinic keratoses will progress to squamous cell carcinoma of the skin. It is not possible to reliably predict which spots will progress to skin cancer and so treatment is recommended to prevent development of skin cancer.  Recommend daily broad spectrum sunscreen SPF 30+ to sun-exposed areas, reapply every 2 hours as needed.  Recommend staying in the shade or wearing long sleeves, sun glasses (UVA+UVB protection) and wide brim hats (4-inch brim around the entire circumference of the hat). Call for new or changing lesions.  Destruction of lesion - face and scalp x 15 Complexity: simple   Destruction method: cryotherapy   Informed consent: discussed and consent obtained   Timeout:   patient name, date of birth, surgical site, and procedure verified Lesion destroyed using liquid nitrogen: Yes   Region frozen until ice ball extended beyond lesion: Yes   Outcome: patient tolerated procedure well with no complications   Post-procedure details: wound care instructions given    Inflamed seborrheic keratosis (3) right wrist/ right arm x 3  Symptomatic, irritating, patient would like treated.  Destruction of lesion - right wrist/ right arm x 3 Complexity: simple   Destruction method: cryotherapy   Informed consent: discussed and consent obtained   Timeout:  patient name, date of birth, surgical site, and procedure verified Lesion destroyed using liquid nitrogen: Yes   Region frozen until ice ball extended beyond lesion: Yes   Outcome: patient tolerated procedure well with no complications   Post-procedure details: wound care instructions given    SEBORRHEIC KERATOSIS - Stuck-on, waxy, tan-brown papules and/or plaques  - Benign-appearing - Discussed benign etiology and prognosis. - Observe - Call for any changes  ACTINIC DAMAGE - chronic, secondary to cumulative UV radiation exposure/sun exposure over time - diffuse scaly erythematous macules with underlying dyspigmentation - Recommend daily broad spectrum sunscreen SPF 30+ to sun-exposed areas, reapply every 2 hours as needed.  - Recommend staying in the shade or wearing long sleeves, sun glasses (UVA+UVB protection) and wide brim hats (4-inch brim around the entire circumference of the hat). - Call for new or changing lesions.  HISTORY OF SQUAMOUS CELL CARCINOMA OF THE SKIN - No evidence of recurrence today - No lymphadenopathy - Recommend regular full body skin exams - Recommend  daily broad spectrum sunscreen SPF 30+ to sun-exposed areas, reapply every 2 hours as needed.  - Call if any new or changing lesions are noted between office visits  Return for 5 month ak follow up.  IAsher Muir, CMA, am  acting as scribe for Armida Sans, MD.  Documentation: I have reviewed the above documentation for accuracy and completeness, and I agree with the above.  Armida Sans, MD

## 2022-09-01 ENCOUNTER — Ambulatory Visit
Admission: RE | Admit: 2022-09-01 | Discharge: 2022-09-01 | Disposition: A | Discharge status: Home / Self Care | Payer: PPO | Source: Ambulatory Visit | Attending: Internal Medicine | Admitting: Internal Medicine

## 2022-09-01 DIAGNOSIS — I10 Essential (primary) hypertension: Secondary | ICD-10-CM | POA: Insufficient documentation

## 2022-09-01 DIAGNOSIS — I712 Thoracic aortic aneurysm, without rupture, unspecified: Secondary | ICD-10-CM | POA: Diagnosis not present

## 2022-09-01 DIAGNOSIS — I7121 Aneurysm of the ascending aorta, without rupture: Secondary | ICD-10-CM | POA: Diagnosis not present

## 2022-09-01 MED ORDER — IOHEXOL 350 MG/ML SOLN
75.0000 mL | Freq: Once | INTRAVENOUS | Status: AC | PRN
  Administered 2022-09-01: 100 mL via INTRAVENOUS

## 2022-09-02 DIAGNOSIS — Z9889 Other specified postprocedural states: Secondary | ICD-10-CM | POA: Diagnosis not present

## 2022-09-02 DIAGNOSIS — M25661 Stiffness of right knee, not elsewhere classified: Secondary | ICD-10-CM | POA: Diagnosis not present

## 2022-09-02 DIAGNOSIS — M25561 Pain in right knee: Secondary | ICD-10-CM | POA: Diagnosis not present

## 2022-09-02 DIAGNOSIS — M6281 Muscle weakness (generalized): Secondary | ICD-10-CM | POA: Diagnosis not present

## 2022-09-03 ENCOUNTER — Encounter: Payer: Self-pay | Admitting: Dermatology

## 2022-09-04 DIAGNOSIS — M6281 Muscle weakness (generalized): Secondary | ICD-10-CM | POA: Diagnosis not present

## 2022-09-04 DIAGNOSIS — M25561 Pain in right knee: Secondary | ICD-10-CM | POA: Diagnosis not present

## 2022-09-04 DIAGNOSIS — M25661 Stiffness of right knee, not elsewhere classified: Secondary | ICD-10-CM | POA: Diagnosis not present

## 2022-09-04 DIAGNOSIS — Z9889 Other specified postprocedural states: Secondary | ICD-10-CM | POA: Diagnosis not present

## 2022-09-09 DIAGNOSIS — M25661 Stiffness of right knee, not elsewhere classified: Secondary | ICD-10-CM | POA: Diagnosis not present

## 2022-09-09 DIAGNOSIS — Z9889 Other specified postprocedural states: Secondary | ICD-10-CM | POA: Diagnosis not present

## 2022-09-11 DIAGNOSIS — M25561 Pain in right knee: Secondary | ICD-10-CM | POA: Diagnosis not present

## 2022-09-11 DIAGNOSIS — M25661 Stiffness of right knee, not elsewhere classified: Secondary | ICD-10-CM | POA: Diagnosis not present

## 2022-09-11 DIAGNOSIS — Z9889 Other specified postprocedural states: Secondary | ICD-10-CM | POA: Diagnosis not present

## 2022-09-11 DIAGNOSIS — M6281 Muscle weakness (generalized): Secondary | ICD-10-CM | POA: Diagnosis not present

## 2022-09-12 ENCOUNTER — Other Ambulatory Visit: Payer: Self-pay | Admitting: Family Medicine

## 2022-09-12 DIAGNOSIS — M7581 Other shoulder lesions, right shoulder: Secondary | ICD-10-CM | POA: Diagnosis not present

## 2022-09-12 DIAGNOSIS — M50321 Other cervical disc degeneration at C4-C5 level: Secondary | ICD-10-CM | POA: Diagnosis not present

## 2022-09-12 DIAGNOSIS — M5412 Radiculopathy, cervical region: Secondary | ICD-10-CM | POA: Diagnosis not present

## 2022-09-12 DIAGNOSIS — M5030 Other cervical disc degeneration, unspecified cervical region: Secondary | ICD-10-CM | POA: Diagnosis not present

## 2022-09-12 DIAGNOSIS — M1711 Unilateral primary osteoarthritis, right knee: Secondary | ICD-10-CM | POA: Diagnosis not present

## 2022-09-12 DIAGNOSIS — M7582 Other shoulder lesions, left shoulder: Secondary | ICD-10-CM | POA: Diagnosis not present

## 2022-09-12 DIAGNOSIS — Q741 Congenital malformation of knee: Secondary | ICD-10-CM | POA: Diagnosis not present

## 2022-09-16 DIAGNOSIS — Z9889 Other specified postprocedural states: Secondary | ICD-10-CM | POA: Diagnosis not present

## 2022-09-16 DIAGNOSIS — M25661 Stiffness of right knee, not elsewhere classified: Secondary | ICD-10-CM | POA: Diagnosis not present

## 2022-09-16 DIAGNOSIS — M6281 Muscle weakness (generalized): Secondary | ICD-10-CM | POA: Diagnosis not present

## 2022-09-16 DIAGNOSIS — M25561 Pain in right knee: Secondary | ICD-10-CM | POA: Diagnosis not present

## 2022-09-19 DIAGNOSIS — M6281 Muscle weakness (generalized): Secondary | ICD-10-CM | POA: Diagnosis not present

## 2022-09-19 DIAGNOSIS — M25661 Stiffness of right knee, not elsewhere classified: Secondary | ICD-10-CM | POA: Diagnosis not present

## 2022-09-19 DIAGNOSIS — Z9889 Other specified postprocedural states: Secondary | ICD-10-CM | POA: Diagnosis not present

## 2022-09-19 DIAGNOSIS — M25561 Pain in right knee: Secondary | ICD-10-CM | POA: Diagnosis not present

## 2022-09-25 DIAGNOSIS — M25561 Pain in right knee: Secondary | ICD-10-CM | POA: Diagnosis not present

## 2022-09-25 DIAGNOSIS — M25661 Stiffness of right knee, not elsewhere classified: Secondary | ICD-10-CM | POA: Diagnosis not present

## 2022-09-25 DIAGNOSIS — Z9889 Other specified postprocedural states: Secondary | ICD-10-CM | POA: Diagnosis not present

## 2022-09-25 DIAGNOSIS — M6281 Muscle weakness (generalized): Secondary | ICD-10-CM | POA: Diagnosis not present

## 2022-09-27 ENCOUNTER — Ambulatory Visit
Admission: RE | Admit: 2022-09-27 | Discharge: 2022-09-27 | Disposition: A | Discharge status: Home / Self Care | Payer: PPO | Source: Ambulatory Visit | Attending: Family Medicine | Admitting: Family Medicine

## 2022-09-27 DIAGNOSIS — M5412 Radiculopathy, cervical region: Secondary | ICD-10-CM

## 2022-09-27 DIAGNOSIS — M50321 Other cervical disc degeneration at C4-C5 level: Secondary | ICD-10-CM | POA: Diagnosis not present

## 2022-09-27 DIAGNOSIS — Z981 Arthrodesis status: Secondary | ICD-10-CM | POA: Diagnosis not present

## 2022-10-08 ENCOUNTER — Other Ambulatory Visit: Payer: PPO

## 2022-10-16 DIAGNOSIS — M5412 Radiculopathy, cervical region: Secondary | ICD-10-CM | POA: Diagnosis not present

## 2022-10-16 DIAGNOSIS — M4802 Spinal stenosis, cervical region: Secondary | ICD-10-CM | POA: Diagnosis not present

## 2022-10-20 DIAGNOSIS — R7303 Prediabetes: Secondary | ICD-10-CM | POA: Diagnosis not present

## 2022-10-20 DIAGNOSIS — E7849 Other hyperlipidemia: Secondary | ICD-10-CM | POA: Diagnosis not present

## 2022-10-20 DIAGNOSIS — R972 Elevated prostate specific antigen [PSA]: Secondary | ICD-10-CM | POA: Diagnosis not present

## 2022-10-20 DIAGNOSIS — M519 Unspecified thoracic, thoracolumbar and lumbosacral intervertebral disc disorder: Secondary | ICD-10-CM | POA: Diagnosis not present

## 2022-10-20 DIAGNOSIS — K219 Gastro-esophageal reflux disease without esophagitis: Secondary | ICD-10-CM | POA: Diagnosis not present

## 2022-10-20 DIAGNOSIS — I10 Essential (primary) hypertension: Secondary | ICD-10-CM | POA: Diagnosis not present

## 2022-10-27 DIAGNOSIS — G4733 Obstructive sleep apnea (adult) (pediatric): Secondary | ICD-10-CM | POA: Diagnosis not present

## 2022-10-27 DIAGNOSIS — K219 Gastro-esophageal reflux disease without esophagitis: Secondary | ICD-10-CM | POA: Diagnosis not present

## 2022-10-27 DIAGNOSIS — R7303 Prediabetes: Secondary | ICD-10-CM | POA: Diagnosis not present

## 2022-10-27 DIAGNOSIS — I7121 Aneurysm of the ascending aorta, without rupture: Secondary | ICD-10-CM | POA: Diagnosis not present

## 2022-10-27 DIAGNOSIS — E7849 Other hyperlipidemia: Secondary | ICD-10-CM | POA: Diagnosis not present

## 2022-10-27 DIAGNOSIS — Z125 Encounter for screening for malignant neoplasm of prostate: Secondary | ICD-10-CM | POA: Diagnosis not present

## 2022-10-27 DIAGNOSIS — I10 Essential (primary) hypertension: Secondary | ICD-10-CM | POA: Diagnosis not present

## 2022-10-27 DIAGNOSIS — M5090 Cervical disc disorder, unspecified, unspecified cervical region: Secondary | ICD-10-CM | POA: Diagnosis not present

## 2022-10-27 DIAGNOSIS — M519 Unspecified thoracic, thoracolumbar and lumbosacral intervertebral disc disorder: Secondary | ICD-10-CM | POA: Diagnosis not present

## 2022-10-31 DIAGNOSIS — Q741 Congenital malformation of knee: Secondary | ICD-10-CM | POA: Diagnosis not present

## 2022-10-31 DIAGNOSIS — M1711 Unilateral primary osteoarthritis, right knee: Secondary | ICD-10-CM | POA: Diagnosis not present

## 2022-11-04 DIAGNOSIS — M4802 Spinal stenosis, cervical region: Secondary | ICD-10-CM | POA: Diagnosis not present

## 2022-11-04 DIAGNOSIS — M5412 Radiculopathy, cervical region: Secondary | ICD-10-CM | POA: Diagnosis not present

## 2022-11-24 DIAGNOSIS — M5136 Other intervertebral disc degeneration, lumbar region: Secondary | ICD-10-CM | POA: Diagnosis not present

## 2022-11-24 DIAGNOSIS — M48062 Spinal stenosis, lumbar region with neurogenic claudication: Secondary | ICD-10-CM | POA: Diagnosis not present

## 2022-11-24 DIAGNOSIS — M5030 Other cervical disc degeneration, unspecified cervical region: Secondary | ICD-10-CM | POA: Diagnosis not present

## 2022-11-24 DIAGNOSIS — M5416 Radiculopathy, lumbar region: Secondary | ICD-10-CM | POA: Diagnosis not present

## 2022-11-24 DIAGNOSIS — M5412 Radiculopathy, cervical region: Secondary | ICD-10-CM | POA: Diagnosis not present

## 2022-11-24 DIAGNOSIS — M4802 Spinal stenosis, cervical region: Secondary | ICD-10-CM | POA: Diagnosis not present

## 2023-01-05 DIAGNOSIS — M5030 Other cervical disc degeneration, unspecified cervical region: Secondary | ICD-10-CM | POA: Diagnosis not present

## 2023-01-05 DIAGNOSIS — M51362 Other intervertebral disc degeneration, lumbar region with discogenic back pain and lower extremity pain: Secondary | ICD-10-CM | POA: Diagnosis not present

## 2023-01-05 DIAGNOSIS — M48062 Spinal stenosis, lumbar region with neurogenic claudication: Secondary | ICD-10-CM | POA: Diagnosis not present

## 2023-01-05 DIAGNOSIS — M4802 Spinal stenosis, cervical region: Secondary | ICD-10-CM | POA: Diagnosis not present

## 2023-01-05 DIAGNOSIS — M47816 Spondylosis without myelopathy or radiculopathy, lumbar region: Secondary | ICD-10-CM | POA: Diagnosis not present

## 2023-01-05 DIAGNOSIS — M5416 Radiculopathy, lumbar region: Secondary | ICD-10-CM | POA: Diagnosis not present

## 2023-01-05 DIAGNOSIS — M5412 Radiculopathy, cervical region: Secondary | ICD-10-CM | POA: Diagnosis not present

## 2023-01-19 DIAGNOSIS — M47816 Spondylosis without myelopathy or radiculopathy, lumbar region: Secondary | ICD-10-CM | POA: Diagnosis not present

## 2023-02-02 DIAGNOSIS — M47816 Spondylosis without myelopathy or radiculopathy, lumbar region: Secondary | ICD-10-CM | POA: Diagnosis not present

## 2023-02-09 DIAGNOSIS — G8929 Other chronic pain: Secondary | ICD-10-CM | POA: Diagnosis not present

## 2023-02-09 DIAGNOSIS — Z9889 Other specified postprocedural states: Secondary | ICD-10-CM | POA: Diagnosis not present

## 2023-02-09 DIAGNOSIS — M7541 Impingement syndrome of right shoulder: Secondary | ICD-10-CM | POA: Diagnosis not present

## 2023-02-09 DIAGNOSIS — M1611 Unilateral primary osteoarthritis, right hip: Secondary | ICD-10-CM | POA: Diagnosis not present

## 2023-02-09 DIAGNOSIS — M7581 Other shoulder lesions, right shoulder: Secondary | ICD-10-CM | POA: Diagnosis not present

## 2023-02-12 DIAGNOSIS — M47816 Spondylosis without myelopathy or radiculopathy, lumbar region: Secondary | ICD-10-CM | POA: Diagnosis not present

## 2023-02-17 DIAGNOSIS — M47816 Spondylosis without myelopathy or radiculopathy, lumbar region: Secondary | ICD-10-CM | POA: Diagnosis not present

## 2023-03-05 ENCOUNTER — Ambulatory Visit: Payer: PPO | Admitting: Dermatology

## 2023-03-05 DIAGNOSIS — L570 Actinic keratosis: Secondary | ICD-10-CM

## 2023-03-05 DIAGNOSIS — Z7189 Other specified counseling: Secondary | ICD-10-CM

## 2023-03-05 DIAGNOSIS — L814 Other melanin hyperpigmentation: Secondary | ICD-10-CM | POA: Diagnosis not present

## 2023-03-05 DIAGNOSIS — L821 Other seborrheic keratosis: Secondary | ICD-10-CM | POA: Diagnosis not present

## 2023-03-05 DIAGNOSIS — L219 Seborrheic dermatitis, unspecified: Secondary | ICD-10-CM

## 2023-03-05 DIAGNOSIS — W908XXA Exposure to other nonionizing radiation, initial encounter: Secondary | ICD-10-CM | POA: Diagnosis not present

## 2023-03-05 DIAGNOSIS — L578 Other skin changes due to chronic exposure to nonionizing radiation: Secondary | ICD-10-CM

## 2023-03-05 DIAGNOSIS — Z79899 Other long term (current) drug therapy: Secondary | ICD-10-CM

## 2023-03-05 MED ORDER — KETOCONAZOLE 2 % EX SHAM: MEDICATED_SHAMPOO | CUTANEOUS | 11 refills | Status: DC

## 2023-03-05 NOTE — Patient Instructions (Addendum)
Seborrheic Dermatitis is a chronic persistent rash characterized by pinkness and scaling most commonly of the mid face but also can occur on the scalp (dandruff), ears; mid chest, mid back and groin.  It tends to be exacerbated by stress and cooler weather.  People who have neurologic disease may experience new onset or exacerbation of existing seborrheic dermatitis.  The condition is not curable but treatable and can be controlled.   For scalp and face  Start ketoconazole shampoo - apply three times per week, massage into scalp and face  and leave in for 5 to 10 minutes before rinsing out  Can use over the counter hydrocortisone 1 % cream to affected scaly areas daily to twice daily as needed.     Actinic keratoses are precancerous spots that appear secondary to cumulative UV radiation exposure/sun exposure over time. They are chronic with expected duration over 1 year. A portion of actinic keratoses will progress to squamous cell carcinoma of the skin. It is not possible to reliably predict which spots will progress to skin cancer and so treatment is recommended to prevent development of skin cancer.  Recommend daily broad spectrum sunscreen SPF 30+ to sun-exposed areas, reapply every 2 hours as needed.  Recommend staying in the shade or wearing long sleeves, sun glasses (UVA+UVB protection) and wide brim hats (4-inch brim around the entire circumference of the hat). Call for new or changing lesions.   Cryotherapy Aftercare  Wash gently with soap and water everyday.   Apply Vaseline and Band-Aid daily until healed.        Due to recent changes in healthcare laws, you may see results of your pathology and/or laboratory studies on MyChart before the doctors have had a chance to review them. We understand that in some cases there may be results that are confusing or concerning to you. Please understand that not all results are received at the same time and often the doctors may need to  interpret multiple results in order to provide you with the best plan of care or course of treatment. Therefore, we ask that you please give [place] 2 business days to thoroughly review all your results before contacting the office for clarification. Should we see a critical lab result, you will be contacted sooner.   If You Need Anything After Your Visit  If you have any questions or concerns for your doctor, please call our main line at [phone] and press option 4 to reach your doctor's medical assistant. If no one answers, please leave a voicemail as directed and we will return your call as soon as possible. Messages left after 4 pm will be answered the following business day.   You may also send [place] a message via MyChart. We typically respond to MyChart messages within 1-2 business days.  For prescription refills, please ask your pharmacy to contact our office. Our fax number is [phone].  If you have an urgent issue when the clinic is closed that cannot wait until the next business day, you can page your doctor at the number below.    Please note that while we do our best to be available for urgent issues outside of office hours, we are not available 24/7.   If you have an urgent issue and are unable to reach [place], you may choose to seek medical care at your doctor's office, retail clinic, urgent care center, or emergency room.  If you have a medical emergency, please immediately call 911 or go to the  emergency department.  Pager Numbers  - Dr. Gwen Pounds: [phone]  - Dr. Roseanne Reno: [phone]  - Dr. Katrinka Blazing: [phone]   In the event of inclement weather, please call our main line at [phone] for an update on the status of any delays or closures.  Dermatology Medication Tips: Please keep the boxes that topical medications come in in order to help keep track of the instructions about where and how to use these. Pharmacies typically print the medication instructions only on  the boxes and not directly on the medication tubes.   If your medication is too expensive, please contact our office at [phone] option 4 or send [place] a message through MyChart.   We are unable to tell what your co-pay for medications will be in advance as this is different depending on your insurance coverage. However, we may be able to find a substitute medication at lower cost or fill out paperwork to get insurance to cover a needed medication.   If a prior authorization is required to get your medication covered by your insurance company, please allow [place] 1-2 business days to complete this process.  Drug prices often vary depending on where the prescription is filled and some pharmacies may offer cheaper prices.  The website www.goodrx.com contains coupons for medications through different pharmacies. The prices here do not account for what the cost may be with help from insurance (it may be cheaper with your insurance), but the website can give you the [place] if you did not use any insurance.  - You can print the associated coupon and take it with your prescription to the pharmacy.  - You may also stop by our office during regular business hours and pick up a GoodRx coupon card.  - If you need your prescription sent electronically to a different pharmacy, notify our office through [facility] or by phone at [phone] option 4.     Si Usted Necesita Algo Despus de Su Visita  Tambin puede enviarnos un mensaje a travs de Clinical cytogeneticist. Por lo general respondemos a los mensajes de MyChart en el transcurso de 1 a 2 das hbiles.  Para renovar recetas, por favor pida a su farmacia que se ponga en contacto con nuestra oficina. Annie Sable de fax es [place] [phone].  Si tiene un asunto urgente cuando la clnica est cerrada y que no puede esperar hasta el siguiente da hbil, puede llamar/localizar a su doctor(a) al nmero que aparece a continuacin.   Por favor, tenga en cuenta que  aunque hacemos todo lo posible para estar disponibles para asuntos urgentes fuera del horario de [place], no estamos disponibles las 24 horas del da, los 7 [address]  [address] de la [place].   Si tiene un problema urgente y no puede comunicarse con nosotros, puede optar por buscar atencin mdica  en el consultorio de su doctor(a), en una clnica privada, en un centro de atencin urgente o en una sala de emergencias.  Si tiene Engineer, drilling, por favor llame inmediatamente al 911 o vaya a la sala de emergencias.  Nmeros de bper  - Dr. Gwen Pounds: [phone]  - Dra. Roseanne Reno: [phone]  - Dr. Katrinka Blazing: [phone]   En caso de inclemencias del tiempo, por favor llame a Lacy Duverney principal al [phone] para una actualizacin sobre el [place] de cualquier retraso o cierre.  Consejos para la medicacin en dermatologa: Por favor, guarde las cajas en las que vienen los medicamentos de uso tpico para ayudarle a seguir las instrucciones sobre dnde y cmo  usarlos. Las farmacias generalmente imprimen las instrucciones del medicamento slo en las cajas y no directamente en los tubos del [place].   Si su medicamento es muy caro, por favor, pngase en contacto con Rolm Gala llamando al [phone] y presione la opcin 4 o envenos un mensaje a travs de Clinical cytogeneticist.   No podemos decirle cul ser su copago por los medicamentos por adelantado ya que esto es diferente dependiendo de la cobertura de su seguro. Sin embargo, es posible que podamos encontrar un medicamento sustituto a Audiological scientist un formulario para que el seguro cubra el medicamento que se considera necesario.   Si se requiere una autorizacin previa para que su compaa de seguros [place] su medicamento, por favor permtanos de 1 a 2 das hbiles para completar [address]  Los precios de los medicamentos varan con frecuencia dependiendo del Environmental consultant de dnde se surte la receta y alguna farmacias pueden ofrecer precios ms  baratos.  El sitio web www.goodrx.com tiene cupones para medicamentos de Health and safety inspector. Los precios aqu no tienen en cuenta lo que podra costar con la ayuda del seguro (puede ser ms barato con su seguro), pero el sitio web puede darle el precio si no utiliz Tourist information centre manager.  - Puede imprimir el cupn correspondiente y llevarlo con su receta a la farmacia.  - Tambin puede pasar por nuestra oficina durante el horario de atencin regular y Education officer, museum una tarjeta de cupones de GoodRx.  - Si necesita que su receta se enve electrnicamente a una farmacia diferente, informe a nuestra oficina a travs de MyChart de [place] o por telfono llamando al [phone] y presione la opcin 4.

## 2023-03-05 NOTE — Progress Notes (Signed)
Follow-Up Visit   Subjective  Rodney Clark is a 67 y.o. male who presents for the following: 6 month ak follow up  Hx of spots at face and scalp also froze isks at on right arm and right hand   The patient has spots, moles and lesions to be evaluated, some may be new or changing and the patient may have concern these could be cancer.   The following portions of the chart were reviewed this encounter and updated as appropriate: medications, allergies, medical history  Review of Systems:  No other skin or systemic complaints except as noted in HPI or Assessment and Plan.  Objective  Well appearing patient in no apparent distress; mood and affect are within normal limits.   A focused examination was performed of the following areas: Arms, hands, face, scalp, ears   Relevant exam findings are noted in the Assessment and Plan.  scalp x 6 (6) Erythematous thin papules/macules with gritty scale.     Assessment & Plan   SEBORRHEIC KERATOSIS - Stuck-on, waxy, tan-brown papules and/or plaques  - Benign-appearing - Discussed benign etiology and prognosis. - Observe - Call for any changes  LENTIGINES Exam: scattered tan macules Due to sun exposure Treatment Plan: Benign-appearing, observe. Recommend daily broad spectrum sunscreen SPF 30+ to sun-exposed areas, reapply every 2 hours as needed.  Call for any changes  ACTINIC DAMAGE - chronic, secondary to cumulative UV radiation exposure/sun exposure over time - diffuse scaly erythematous macules with underlying dyspigmentation - Recommend daily broad spectrum sunscreen SPF 30+ to sun-exposed areas, reapply every 2 hours as needed.  - Recommend staying in the shade or wearing long sleeves, sun glasses (UVA+UVB protection) and wide brim hats (4-inch brim around the entire circumference of the hat). - Call for new or changing lesions.  SEBORRHEIC DERMATITIS Exam: Pink patches with greasy scale at scalp and glabella   Chronic  and persistent condition with duration or expected duration over one year. Condition is bothersome/symptomatic for patient. Currently flared.  Seborrheic Dermatitis is a chronic persistent rash characterized by pinkness and scaling most commonly of the mid face but also can occur on the scalp (dandruff), ears; mid chest, mid back and groin.  It tends to be exacerbated by stress and cooler weather.  People who have neurologic disease may experience new onset or exacerbation of existing seborrheic dermatitis.  The condition is not curable but treatable and can be controlled.  Treatment Plan: Start ketoconazole shampoo apply three times per week, massage into scalp and entire face and leave in for 10 minutes before rinsing out  Use otc hydrocoritisone add if needed   Seborrheic dermatitis  Related Medications ketoconazole (NIZORAL) 2 % shampoo apply three times per week, massage into scalp and entire face and leave in for 5 -10 minutes before rinsing out  Actinic keratosis (6) scalp x 6  Actinic keratoses are precancerous spots that appear secondary to cumulative UV radiation exposure/sun exposure over time. They are chronic with expected duration over 1 year. A portion of actinic keratoses will progress to squamous cell carcinoma of the skin. It is not possible to reliably predict which spots will progress to skin cancer and so treatment is recommended to prevent development of skin cancer.  Recommend daily broad spectrum sunscreen SPF 30+ to sun-exposed areas, reapply every 2 hours as needed.  Recommend staying in the shade or wearing long sleeves, sun glasses (UVA+UVB protection) and wide brim hats (4-inch brim around the entire circumference of the hat).  Call for new or changing lesions.  Destruction of lesion - scalp x 6 (6) Complexity: simple   Destruction method: cryotherapy   Informed consent: discussed and consent obtained   Timeout:  patient name, date of birth, surgical site, and  procedure verified Lesion destroyed using liquid nitrogen: Yes   Region frozen until ice ball extended beyond lesion: Yes   Outcome: patient tolerated procedure well with no complications   Post-procedure details: wound care instructions given      Return for 6 - 8 month tbse.  IAsher Muir, CMA, am acting as scribe for Armida Sans, MD.   Documentation: I have reviewed the above documentation for accuracy and completeness, and I agree with the above.  Armida Sans, MD

## 2023-03-10 ENCOUNTER — Encounter: Payer: Self-pay | Admitting: Dermatology

## 2023-04-06 DIAGNOSIS — M47816 Spondylosis without myelopathy or radiculopathy, lumbar region: Secondary | ICD-10-CM | POA: Diagnosis not present

## 2023-04-06 DIAGNOSIS — M5412 Radiculopathy, cervical region: Secondary | ICD-10-CM | POA: Diagnosis not present

## 2023-04-06 DIAGNOSIS — M48062 Spinal stenosis, lumbar region with neurogenic claudication: Secondary | ICD-10-CM | POA: Diagnosis not present

## 2023-04-06 DIAGNOSIS — M4802 Spinal stenosis, cervical region: Secondary | ICD-10-CM | POA: Diagnosis not present

## 2023-04-06 DIAGNOSIS — M5416 Radiculopathy, lumbar region: Secondary | ICD-10-CM | POA: Diagnosis not present

## 2023-04-06 DIAGNOSIS — M1611 Unilateral primary osteoarthritis, right hip: Secondary | ICD-10-CM | POA: Diagnosis not present

## 2023-04-07 ENCOUNTER — Other Ambulatory Visit: Payer: Self-pay | Admitting: Surgery

## 2023-04-07 DIAGNOSIS — Z9889 Other specified postprocedural states: Secondary | ICD-10-CM

## 2023-04-07 DIAGNOSIS — M7581 Other shoulder lesions, right shoulder: Secondary | ICD-10-CM

## 2023-04-17 ENCOUNTER — Ambulatory Visit
Admission: RE | Admit: 2023-04-17 | Discharge: 2023-04-17 | Disposition: A | Discharge status: Home / Self Care | Payer: PPO | Source: Ambulatory Visit | Attending: Surgery | Admitting: Surgery

## 2023-04-17 DIAGNOSIS — M7551 Bursitis of right shoulder: Secondary | ICD-10-CM | POA: Diagnosis not present

## 2023-04-17 DIAGNOSIS — M7581 Other shoulder lesions, right shoulder: Secondary | ICD-10-CM

## 2023-04-17 DIAGNOSIS — M19011 Primary osteoarthritis, right shoulder: Secondary | ICD-10-CM | POA: Diagnosis not present

## 2023-04-17 DIAGNOSIS — M75121 Complete rotator cuff tear or rupture of right shoulder, not specified as traumatic: Secondary | ICD-10-CM | POA: Diagnosis not present

## 2023-04-17 DIAGNOSIS — Z9889 Other specified postprocedural states: Secondary | ICD-10-CM

## 2023-04-17 DIAGNOSIS — M7521 Bicipital tendinitis, right shoulder: Secondary | ICD-10-CM | POA: Diagnosis not present

## 2023-04-28 DIAGNOSIS — I10 Essential (primary) hypertension: Secondary | ICD-10-CM | POA: Diagnosis not present

## 2023-04-28 DIAGNOSIS — M519 Unspecified thoracic, thoracolumbar and lumbosacral intervertebral disc disorder: Secondary | ICD-10-CM | POA: Diagnosis not present

## 2023-04-28 DIAGNOSIS — E7849 Other hyperlipidemia: Secondary | ICD-10-CM | POA: Diagnosis not present

## 2023-04-28 DIAGNOSIS — K219 Gastro-esophageal reflux disease without esophagitis: Secondary | ICD-10-CM | POA: Diagnosis not present

## 2023-04-28 DIAGNOSIS — Z77018 Contact with and (suspected) exposure to other hazardous metals: Secondary | ICD-10-CM | POA: Diagnosis not present

## 2023-04-28 DIAGNOSIS — R7303 Prediabetes: Secondary | ICD-10-CM | POA: Diagnosis not present

## 2023-04-28 DIAGNOSIS — Z125 Encounter for screening for malignant neoplasm of prostate: Secondary | ICD-10-CM | POA: Diagnosis not present

## 2023-05-01 DIAGNOSIS — M7521 Bicipital tendinitis, right shoulder: Secondary | ICD-10-CM | POA: Diagnosis not present

## 2023-05-01 DIAGNOSIS — Z9889 Other specified postprocedural states: Secondary | ICD-10-CM | POA: Diagnosis not present

## 2023-05-01 DIAGNOSIS — M12811 Other specific arthropathies, not elsewhere classified, right shoulder: Secondary | ICD-10-CM | POA: Diagnosis not present

## 2023-05-01 DIAGNOSIS — M7581 Other shoulder lesions, right shoulder: Secondary | ICD-10-CM | POA: Diagnosis not present

## 2023-05-01 DIAGNOSIS — M75121 Complete rotator cuff tear or rupture of right shoulder, not specified as traumatic: Secondary | ICD-10-CM | POA: Diagnosis not present

## 2023-05-05 DIAGNOSIS — R7303 Prediabetes: Secondary | ICD-10-CM | POA: Diagnosis not present

## 2023-05-05 DIAGNOSIS — I10 Essential (primary) hypertension: Secondary | ICD-10-CM | POA: Diagnosis not present

## 2023-05-05 DIAGNOSIS — K219 Gastro-esophageal reflux disease without esophagitis: Secondary | ICD-10-CM | POA: Diagnosis not present

## 2023-05-05 DIAGNOSIS — G4733 Obstructive sleep apnea (adult) (pediatric): Secondary | ICD-10-CM | POA: Diagnosis not present

## 2023-05-05 DIAGNOSIS — E7849 Other hyperlipidemia: Secondary | ICD-10-CM | POA: Diagnosis not present

## 2023-05-05 DIAGNOSIS — M519 Unspecified thoracic, thoracolumbar and lumbosacral intervertebral disc disorder: Secondary | ICD-10-CM | POA: Diagnosis not present

## 2023-05-05 DIAGNOSIS — R972 Elevated prostate specific antigen [PSA]: Secondary | ICD-10-CM | POA: Diagnosis not present

## 2023-05-05 DIAGNOSIS — M5090 Cervical disc disorder, unspecified, unspecified cervical region: Secondary | ICD-10-CM | POA: Diagnosis not present

## 2023-05-05 DIAGNOSIS — Z0000 Encounter for general adult medical examination without abnormal findings: Secondary | ICD-10-CM | POA: Diagnosis not present

## 2023-05-05 DIAGNOSIS — I7121 Aneurysm of the ascending aorta, without rupture: Secondary | ICD-10-CM | POA: Diagnosis not present

## 2023-05-14 ENCOUNTER — Other Ambulatory Visit: Payer: Self-pay | Admitting: Surgery

## 2023-05-18 ENCOUNTER — Other Ambulatory Visit: Payer: Self-pay

## 2023-05-18 ENCOUNTER — Encounter
Admission: RE | Admit: 2023-05-18 | Discharge: 2023-05-18 | Disposition: A | Discharge status: Home / Self Care | Payer: HMO | Source: Ambulatory Visit | Attending: Surgery

## 2023-05-18 DIAGNOSIS — Z01818 Encounter for other preprocedural examination: Secondary | ICD-10-CM | POA: Diagnosis not present

## 2023-05-18 DIAGNOSIS — Z01812 Encounter for preprocedural laboratory examination: Secondary | ICD-10-CM

## 2023-05-18 DIAGNOSIS — Z01810 Encounter for preprocedural cardiovascular examination: Secondary | ICD-10-CM | POA: Diagnosis not present

## 2023-05-18 DIAGNOSIS — R9431 Abnormal electrocardiogram [ECG] [EKG]: Secondary | ICD-10-CM | POA: Insufficient documentation

## 2023-05-18 HISTORY — DX: Myelopathy in diseases classified elsewhere: G99.2

## 2023-05-18 HISTORY — DX: Sacroiliitis, not elsewhere classified: M46.1

## 2023-05-18 HISTORY — DX: Elevated prostate specific antigen (PSA): R97.20

## 2023-05-18 HISTORY — DX: Aneurysm of the ascending aorta, without rupture: I71.21

## 2023-05-18 HISTORY — DX: Spinal stenosis, cervical region: M48.02

## 2023-05-18 HISTORY — DX: Essential (primary) hypertension: I10

## 2023-05-18 LAB — SURGICAL PCR SCREEN
MRSA, PCR: NEGATIVE
Staphylococcus aureus: NEGATIVE

## 2023-05-18 NOTE — Patient Instructions (Addendum)
Your procedure is scheduled on: Wednesday, February 19 Report to the Registration Desk on the 1st floor of the [facility] To find out your arrival time, please call [phone] between 1PM - 3PM on: Tuesday, February 18 If your arrival time is 6:00 am, do not arrive before that time as the [facility] entrance doors do not open until 6:00 am.  REMEMBER: Instructions that are not followed completely may result in serious medical risk, up to and including death; or upon the discretion of your surgeon and anesthesiologist your surgery may need to be rescheduled.  Do not eat food after midnight the night before surgery.  No gum chewing or hard candies.  You may however, drink CLEAR liquids up to 2 hours before you are scheduled to arrive for your surgery. Do not drink anything within 2 hours of your scheduled arrival time.  Clear liquids include: - water  - apple juice without pulp - gatorade (not RED colors) - black coffee or tea (Do NOT add milk or creamers to the coffee or tea) Do NOT drink anything that is not on this list.  In addition, your doctor has ordered for you to drink the provided:  Ensure Pre-Surgery Clear Carbohydrate Drink  Drinking this carbohydrate drink up to two hours before surgery helps to reduce insulin resistance and improve patient outcomes. Please complete drinking 2 hours before scheduled arrival time.  One week prior to surgery: Stop Anti-inflammatories (NSAIDS) such as Advil, Aleve, Ibuprofen, Motrin, Naproxen, Naprosyn and Aspirin based products such as Excedrin, Goody's Powder, BC Powder. Stop ANY OVER THE COUNTER supplements until after surgery. Stop melatonin  You may however, continue to take Tylenol if needed for pain up until the day of surgery.  Continue taking all of your other prescription medications up until the day of surgery.  ON THE DAY OF SURGERY ONLY TAKE THESE MEDICATIONS WITH SIPS OF WATER:  famotidine (PEPCID)  fenofibrate   gabapentin (NEURONTIN) pravastatin (PRAVACHOL)   No Alcohol for 24 hours before or after surgery.  No Smoking including e-cigarettes for 24 hours before surgery.  No chewable tobacco products for at least 6 hours before surgery.  No nicotine patches on the day of surgery.  Do not use any "recreational" drugs for at least a week (preferably 2 weeks) before your surgery.  Please be advised that the combination of cocaine and anesthesia may have negative outcomes, up to and including death. If you test positive for cocaine, your surgery will be cancelled.  On the morning of surgery brush your teeth with toothpaste and water, you may rinse your mouth with mouthwash if you wish. Do not swallow any toothpaste or mouthwash.  Use CHG Soap as directed on instruction sheet.  Do not wear jewelry, make-up, hairpins, clips or nail polish.  For welded (permanent) jewelry: bracelets, anklets, waist bands, etc.  Please have this removed prior to surgery.  If it is not removed, there is a chance that hospital personnel will need to cut it off on the day of surgery.  Do not wear lotions, powders, or perfumes. No deodorant.  Do not shave body hair from the neck down 48 hours before surgery.  Contact lenses, hearing aids and dentures may not be worn into surgery.  Do not bring valuables to the hospital. [facility] is not responsible for any missing/lost belongings or valuables.   Total Shoulder Arthroplasty:  use Benzoyl Peroxide 5% Gel as directed on instruction sheet.  Bring your C-PAP to the hospital in  case you may have to spend the night.   Notify your doctor if there is any change in your medical condition (cold, fever, infection).  Wear comfortable clothing (specific to your surgery type) to the hospital.  After surgery, you can help prevent lung complications by doing breathing exercises.  Take deep breaths and cough every 1-2 hours. Your doctor may order a device called an Incentive  Spirometer to help you take deep breaths.  If you are being admitted to the hospital overnight, leave your suitcase in the car. After surgery it may be brought to your room.  In case of increased patient census, it may be necessary for you, the patient, to continue your postoperative care in the Same Day Surgery department.  If you are being discharged the day of surgery, you will not be allowed to drive home. You will need a responsible individual to drive you home and stay with you for 24 hours after surgery.   If you are taking public transportation, you will need to have a responsible individual with you.  Please call the Pre-admissions Testing Dept. at [phone] if you have any questions about these instructions.  Surgery Visitation Policy:  Patients having surgery or a procedure may have two visitors.  Children under the age of 44 must have an adult with them who is not the patient.  Temporary Visitor Restrictions Due to increasing cases of flu, RSV and COVID-19: Children ages 38 and under will not be able to visit patients in [facility] hospitals under most circumstances.  Inpatient Visitation:    Visiting hours are 7 a.m. to 8 p.m. Up to four visitors are allowed at one time in a patient room. The visitors may rotate out with other people during the day.  One visitor age 70 or older may stay with the patient overnight and must be in the room by 8 p.m.   Preparing for Total Shoulder Arthroplasty  Before surgery, you can play an important role by reducing the number of germs on your skin by using the following products:  Benzoyl Peroxide Gel  o Reduces the number of germs present on the skin  o Applied twice a day to shoulder area starting two days before surgery  Chlorhexidine Gluconate (CHG) Soap  o An antiseptic cleaner that kills germs and bonds with the skin to continue killing germs even after washing  o Used for showering the night before surgery and  morning of surgery  BENZOYL PEROXIDE 5% GEL  Please do not use if you have an allergy to benzoyl peroxide. If your skin becomes reddened/irritated stop using the benzoyl peroxide.  Starting two days before surgery, apply as follows:  1. Apply benzoyl peroxide in the morning and at night. Apply after taking a shower. If you are not taking a shower, clean entire shoulder front, back, and side along with the armpit with a clean wet washcloth.  2. Place a quarter-sized dollop on your shoulder and rub in thoroughly, making sure to cover the front, back, and side of your shoulder, along with the armpit.  2 days before ____ AM ____ PM 1 day before ____ AM ____ PM  3. Do this twice a day for two days. (Last application is the night before surgery, AFTER using the CHG soap).  4. Do NOT apply benzoyl peroxide gel on the day of surgery.       Pre-operative 5 CHG Bath Instructions   You can play a key role in reducing the  risk of infection after surgery. Your skin needs to be as free of germs as possible. You can reduce the number of germs on your skin by washing with CHG (chlorhexidine gluconate) soap before surgery. CHG is an antiseptic soap that kills germs and continues to kill germs even after washing.   DO NOT use if you have an allergy to chlorhexidine/CHG or antibacterial soaps. If your skin becomes reddened or irritated, stop using the CHG and notify one of our RNs at [phone].   Please shower with the CHG soap starting 4 days before surgery using the following schedule:     Please keep in mind the following:  DO NOT shave, including legs and underarms, starting the day of your first shower.   You may shave your face at any point before/day of surgery.  Place clean sheets on your bed the day you start using CHG soap. Use a clean washcloth (not used since being washed) for each shower. DO NOT sleep with pets once you start using the CHG.   CHG Shower Instructions:  If you  choose to wash your hair and private area, wash first with your normal shampoo/soap.  After you use shampoo/soap, rinse your hair and body thoroughly to remove shampoo/soap residue.  Turn the water OFF and apply about 3 tablespoons (45 ml) of CHG soap to a CLEAN washcloth.  Apply CHG soap ONLY FROM YOUR NECK DOWN TO YOUR TOES (washing for 3-5 minutes)  DO NOT use CHG soap on face, private areas, open wounds, or sores.  Pay special attention to the area where your surgery is being performed.  If you are having back surgery, having someone wash your back for you may be helpful. Wait 2 minutes after CHG soap is applied, then you may rinse off the CHG soap.  Pat dry with a clean towel  Put on clean clothes/pajamas   If you choose to wear lotion, please use ONLY the CHG-compatible lotions on the back of this paper.     Additional instructions for the day of surgery: DO NOT APPLY any lotions, deodorants, cologne, or perfumes.   Put on clean/comfortable clothes.  Brush your teeth.  Ask your nurse before applying any prescription medications to the skin.      CHG Compatible Lotions   Aveeno Moisturizing lotion  Cetaphil Moisturizing Cream  Cetaphil Moisturizing Lotion  Clairol Herbal Essence Moisturizing Lotion, Dry Skin  Clairol Herbal Essence Moisturizing Lotion, Extra Dry Skin  Clairol Herbal Essence Moisturizing Lotion, Normal Skin  Curel Age Defying Therapeutic Moisturizing Lotion with Alpha Hydroxy  Curel Extreme Care Body Lotion  Curel Soothing Hands Moisturizing Hand Lotion  Curel Therapeutic Moisturizing Cream, Fragrance-Free  Curel Therapeutic Moisturizing Lotion, Fragrance-Free  Curel Therapeutic Moisturizing Lotion, Original Formula  Eucerin Daily Replenishing Lotion  Eucerin Dry Skin Therapy Plus Alpha Hydroxy Crme  Eucerin Dry Skin Therapy Plus Alpha Hydroxy Lotion  Eucerin Original Crme  Eucerin Original Lotion  Eucerin Plus Crme Eucerin Plus Lotion  Eucerin  TriLipid Replenishing Lotion  Keri Anti-Bacterial Hand Lotion  Keri Deep Conditioning Original Lotion Dry Skin Formula Softly Scented  Keri Deep Conditioning Original Lotion, Fragrance Free Sensitive Skin Formula  Keri Lotion Fast Absorbing Fragrance Free Sensitive Skin Formula  Keri Lotion Fast Absorbing Softly Scented Dry Skin Formula  Keri Original Lotion  Keri Skin Renewal Lotion Keri Silky Smooth Lotion  Keri Silky Smooth Sensitive Skin Lotion  Nivea Body Creamy Conditioning Oil  Nivea Body Extra Enriched Counsellor  Nivea Body Sheer Moisturizing Lotion Nivea Crme  Nivea Skin Firming Lotion  NutraDerm 30 Skin Lotion  NutraDerm Skin Lotion  NutraDerm Therapeutic Skin Cream  NutraDerm Therapeutic Skin Lotion  ProShield Protective Hand Cream  Provon moisturizing lotion  Preoperative Educational Videos for Total Hip, Knee and Shoulder Replacements  To better prepare for surgery, please view our videos that explain the physical activity and discharge planning required to have the best surgical recovery at [facility].  [email or web address]  Questions? Call [phone] or email [email or web address]

## 2023-05-20 ENCOUNTER — Other Ambulatory Visit: Payer: Self-pay

## 2023-05-20 ENCOUNTER — Encounter
Admission: RE | Disposition: A | Discharge status: Home / Self Care | Payer: Self-pay | Source: Home / Self Care | Attending: Surgery

## 2023-05-20 ENCOUNTER — Encounter: Payer: Self-pay | Admitting: Surgery

## 2023-05-20 ENCOUNTER — Ambulatory Visit: Payer: HMO

## 2023-05-20 ENCOUNTER — Ambulatory Visit
Admission: RE | Admit: 2023-05-20 | Discharge: 2023-05-20 | Disposition: A | Discharge status: Home / Self Care | Payer: HMO | Attending: Surgery | Admitting: Surgery

## 2023-05-20 ENCOUNTER — Ambulatory Visit: Payer: Self-pay | Admitting: Urgent Care

## 2023-05-20 ENCOUNTER — Ambulatory Visit: Payer: Self-pay

## 2023-05-20 DIAGNOSIS — I10 Essential (primary) hypertension: Secondary | ICD-10-CM | POA: Diagnosis not present

## 2023-05-20 DIAGNOSIS — Z9889 Other specified postprocedural states: Secondary | ICD-10-CM | POA: Diagnosis not present

## 2023-05-20 DIAGNOSIS — Z01812 Encounter for preprocedural laboratory examination: Secondary | ICD-10-CM

## 2023-05-20 DIAGNOSIS — M75121 Complete rotator cuff tear or rupture of right shoulder, not specified as traumatic: Secondary | ICD-10-CM | POA: Diagnosis not present

## 2023-05-20 DIAGNOSIS — M25811 Other specified joint disorders, right shoulder: Secondary | ICD-10-CM | POA: Insufficient documentation

## 2023-05-20 DIAGNOSIS — M12811 Other specific arthropathies, not elsewhere classified, right shoulder: Secondary | ICD-10-CM | POA: Insufficient documentation

## 2023-05-20 DIAGNOSIS — G4730 Sleep apnea, unspecified: Secondary | ICD-10-CM | POA: Insufficient documentation

## 2023-05-20 DIAGNOSIS — K219 Gastro-esophageal reflux disease without esophagitis: Secondary | ICD-10-CM | POA: Insufficient documentation

## 2023-05-20 DIAGNOSIS — Z6841 Body Mass Index (BMI) 40.0 and over, adult: Secondary | ICD-10-CM | POA: Diagnosis not present

## 2023-05-20 DIAGNOSIS — M7521 Bicipital tendinitis, right shoulder: Secondary | ICD-10-CM | POA: Insufficient documentation

## 2023-05-20 DIAGNOSIS — Z96611 Presence of right artificial shoulder joint: Secondary | ICD-10-CM | POA: Diagnosis not present

## 2023-05-20 DIAGNOSIS — M7581 Other shoulder lesions, right shoulder: Secondary | ICD-10-CM | POA: Diagnosis not present

## 2023-05-20 DIAGNOSIS — Z471 Aftercare following joint replacement surgery: Secondary | ICD-10-CM | POA: Diagnosis not present

## 2023-05-20 DIAGNOSIS — L219 Seborrheic dermatitis, unspecified: Secondary | ICD-10-CM

## 2023-05-20 DIAGNOSIS — M75101 Unspecified rotator cuff tear or rupture of right shoulder, not specified as traumatic: Secondary | ICD-10-CM | POA: Diagnosis present

## 2023-05-20 HISTORY — PX: REVERSE SHOULDER ARTHROPLASTY: SHX5054

## 2023-05-20 LAB — TYPE AND SCREEN
ABO/RH(D): B POS
Antibody Screen: NEGATIVE

## 2023-05-20 SURGERY — ARTHROPLASTY, SHOULDER, TOTAL, REVERSE
Anesthesia: General | Site: Shoulder | Laterality: Right

## 2023-05-20 MED ORDER — MIDAZOLAM HCL 2 MG/2ML IJ SOLN
INTRAMUSCULAR | Status: AC
  Filled 2023-05-20: qty 2

## 2023-05-20 MED ORDER — OXYCODONE HCL 5 MG PO TABS
5.0000 mg | ORAL_TABLET | ORAL | Status: DC | PRN
  Administered 2023-05-20: 10 mg via ORAL

## 2023-05-20 MED ORDER — DEXTROSE-SODIUM CHLORIDE 5-0.9 % IV SOLN: INTRAVENOUS | Status: DC

## 2023-05-20 MED ORDER — KETAMINE HCL 10 MG/ML IJ SOLN
INTRAMUSCULAR | Status: DC | PRN
  Administered 2023-05-20: 30 mg via INTRAVENOUS
  Administered 2023-05-20 (×2): 10 mg via INTRAVENOUS

## 2023-05-20 MED ORDER — FENTANYL CITRATE (PF) 100 MCG/2ML IJ SOLN
INTRAMUSCULAR | Status: DC | PRN
  Administered 2023-05-20 (×3): 50 ug via INTRAVENOUS

## 2023-05-20 MED ORDER — SUGAMMADEX SODIUM 200 MG/2ML IV SOLN
INTRAVENOUS | Status: DC | PRN
Start: 2023-05-20 — End: 2023-05-20
  Administered 2023-05-20: 200 mg via INTRAVENOUS

## 2023-05-20 MED ORDER — CEFAZOLIN SODIUM-DEXTROSE 2-4 GM/100ML-% IV SOLN
INTRAVENOUS | Status: AC
  Filled 2023-05-20: qty 100

## 2023-05-20 MED ORDER — ACETAMINOPHEN 10 MG/ML IV SOLN: 1000.0000 mg | Freq: Once | INTRAVENOUS | Status: DC | PRN

## 2023-05-20 MED ORDER — BUPIVACAINE LIPOSOME 1.3 % IJ SUSP
INTRAMUSCULAR | Status: DC | PRN
Start: 2023-05-20 — End: 2023-05-20
  Administered 2023-05-20: 20 mL via PERINEURAL

## 2023-05-20 MED ORDER — BUPIVACAINE-EPINEPHRINE (PF) 0.5% -1:200000 IJ SOLN
INTRAMUSCULAR | Status: DC | PRN
  Administered 2023-05-20: 30 mL

## 2023-05-20 MED ORDER — FENTANYL CITRATE PF 50 MCG/ML IJ SOSY
PREFILLED_SYRINGE | INTRAMUSCULAR | Status: AC
  Filled 2023-05-20: qty 1

## 2023-05-20 MED ORDER — ORAL CARE MOUTH RINSE: 15.0000 mL | Freq: Once | OROMUCOSAL | Status: DC

## 2023-05-20 MED ORDER — EPHEDRINE SULFATE-NACL 50-0.9 MG/10ML-% IV SOSY
PREFILLED_SYRINGE | INTRAVENOUS | Status: DC | PRN
  Administered 2023-05-20 (×2): 5 mg via INTRAVENOUS

## 2023-05-20 MED ORDER — BUPIVACAINE HCL (PF) 0.5 % IJ SOLN
INTRAMUSCULAR | Status: DC | PRN
  Administered 2023-05-20: 10 mL via PERINEURAL

## 2023-05-20 MED ORDER — ONDANSETRON HCL 4 MG/2ML IJ SOLN: 4.0000 mg | Freq: Four times a day (QID) | INTRAMUSCULAR | Status: DC | PRN

## 2023-05-20 MED ORDER — BUPIVACAINE LIPOSOME 1.3 % IJ SUSP
INTRAMUSCULAR | Status: AC
  Filled 2023-05-20: qty 20

## 2023-05-20 MED ORDER — LIDOCAINE HCL (CARDIAC) PF 100 MG/5ML IV SOSY
PREFILLED_SYRINGE | INTRAVENOUS | Status: DC | PRN
  Administered 2023-05-20: 100 mg via INTRAVENOUS

## 2023-05-20 MED ORDER — CEFAZOLIN SODIUM-DEXTROSE 2-4 GM/100ML-% IV SOLN
2.0000 g | INTRAVENOUS | Status: AC
  Administered 2023-05-20: 3 g via INTRAVENOUS

## 2023-05-20 MED ORDER — KETOROLAC TROMETHAMINE 15 MG/ML IJ SOLN: 15.0000 mg | Freq: Once | INTRAMUSCULAR | Status: DC

## 2023-05-20 MED ORDER — FENTANYL CITRATE (PF) 100 MCG/2ML IJ SOLN
25.0000 ug | INTRAMUSCULAR | Status: DC | PRN
  Administered 2023-05-20: 50 ug via INTRAVENOUS

## 2023-05-20 MED ORDER — FENTANYL CITRATE (PF) 100 MCG/2ML IJ SOLN
INTRAMUSCULAR | Status: AC
Start: 2023-05-20 — End: ?
  Filled 2023-05-20: qty 2

## 2023-05-20 MED ORDER — MIDAZOLAM HCL 2 MG/2ML IJ SOLN
INTRAMUSCULAR | Status: DC | PRN
  Administered 2023-05-20: 2 mg via INTRAVENOUS

## 2023-05-20 MED ORDER — OXYCODONE HCL 5 MG PO TABS
5.0000 mg | ORAL_TABLET | ORAL | 0 refills | Status: DC | PRN
  Filled 2023-05-20: qty 40, 4d supply, fill #0

## 2023-05-20 MED ORDER — CEFAZOLIN SODIUM-DEXTROSE 2-4 GM/100ML-% IV SOLN
2.0000 g | Freq: Four times a day (QID) | INTRAVENOUS | Status: DC
  Administered 2023-05-20: 2 g via INTRAVENOUS

## 2023-05-20 MED ORDER — KETOCONAZOLE 2 % EX SHAM
1.0000 | MEDICATED_SHAMPOO | CUTANEOUS | Status: DC
Start: 2023-05-21 — End: 2024-01-08

## 2023-05-20 MED ORDER — TRANEXAMIC ACID-NACL 1000-0.7 MG/100ML-% IV SOLN
INTRAVENOUS | Status: AC
  Filled 2023-05-20: qty 100

## 2023-05-20 MED ORDER — DEXAMETHASONE SODIUM PHOSPHATE 10 MG/ML IJ SOLN
INTRAMUSCULAR | Status: DC | PRN
  Administered 2023-05-20: 20 mg via INTRAVENOUS

## 2023-05-20 MED ORDER — BUPIVACAINE-EPINEPHRINE (PF) 0.5% -1:200000 IJ SOLN
INTRAMUSCULAR | Status: AC
  Filled 2023-05-20: qty 10

## 2023-05-20 MED ORDER — OXYCODONE HCL 5 MG/5ML PO SOLN: 5.0000 mg | Freq: Once | ORAL | Status: DC | PRN

## 2023-05-20 MED ORDER — TRANEXAMIC ACID-NACL 1000-0.7 MG/100ML-% IV SOLN
1000.0000 mg | INTRAVENOUS | Status: AC
  Administered 2023-05-20: 1000 mg via INTRAVENOUS

## 2023-05-20 MED ORDER — METOCLOPRAMIDE HCL 10 MG PO TABS: 5.0000 mg | ORAL_TABLET | Freq: Three times a day (TID) | ORAL | Status: DC | PRN

## 2023-05-20 MED ORDER — METOCLOPRAMIDE HCL 5 MG/ML IJ SOLN: 5.0000 mg | Freq: Three times a day (TID) | INTRAMUSCULAR | Status: DC | PRN

## 2023-05-20 MED ORDER — KETOROLAC TROMETHAMINE 30 MG/ML IJ SOLN
INTRAMUSCULAR | Status: DC | PRN
  Administered 2023-05-20: 30 mg via INTRAVENOUS

## 2023-05-20 MED ORDER — SODIUM CHLORIDE 0.9 % IR SOLN
Status: DC | PRN
  Administered 2023-05-20: 3000 mL

## 2023-05-20 MED ORDER — ONDANSETRON HCL 4 MG/2ML IJ SOLN
INTRAMUSCULAR | Status: DC | PRN
  Administered 2023-05-20: 4 mg via INTRAVENOUS

## 2023-05-20 MED ORDER — OXYCODONE HCL 5 MG PO TABS: 5.0000 mg | ORAL_TABLET | Freq: Once | ORAL | Status: DC | PRN

## 2023-05-20 MED ORDER — ONDANSETRON HCL 4 MG PO TABS: 4.0000 mg | ORAL_TABLET | Freq: Four times a day (QID) | ORAL | Status: DC | PRN

## 2023-05-20 MED ORDER — CHLORHEXIDINE GLUCONATE 0.12 % MT SOLN: 15.0000 mL | Freq: Once | OROMUCOSAL | Status: DC

## 2023-05-20 MED ORDER — KETAMINE HCL 50 MG/5ML IJ SOSY
PREFILLED_SYRINGE | INTRAMUSCULAR | Status: AC
  Filled 2023-05-20: qty 5

## 2023-05-20 MED ORDER — ACETAMINOPHEN 325 MG PO TABS: 325.0000 mg | ORAL_TABLET | Freq: Four times a day (QID) | ORAL | Status: DC | PRN

## 2023-05-20 MED ORDER — CHLORHEXIDINE GLUCONATE 0.12 % MT SOLN
OROMUCOSAL | Status: AC
  Filled 2023-05-20: qty 15

## 2023-05-20 MED ORDER — BUPIVACAINE LIPOSOME 1.3 % IJ SUSP
INTRAMUSCULAR | Status: AC
  Filled 2023-05-20: qty 10

## 2023-05-20 MED ORDER — ROCURONIUM BROMIDE 100 MG/10ML IV SOLN
INTRAVENOUS | Status: DC | PRN
  Administered 2023-05-20: 60 mg via INTRAVENOUS
  Administered 2023-05-20: 10 mg via INTRAVENOUS

## 2023-05-20 MED ORDER — PROPOFOL 10 MG/ML IV BOLUS
INTRAVENOUS | Status: DC | PRN
  Administered 2023-05-20: 20 mg via INTRAVENOUS
  Administered 2023-05-20: 150 mg via INTRAVENOUS

## 2023-05-20 MED ORDER — OXYCODONE HCL 5 MG PO TABS
ORAL_TABLET | ORAL | Status: AC
  Filled 2023-05-20: qty 2

## 2023-05-20 MED ORDER — LACTATED RINGERS IV SOLN: INTRAVENOUS | Status: DC

## 2023-05-20 MED ORDER — SODIUM CHLORIDE (PF) 0.9 % IJ SOLN
INTRAMUSCULAR | Status: AC
  Filled 2023-05-20: qty 20

## 2023-05-20 MED ORDER — DROPERIDOL 2.5 MG/ML IJ SOLN: 0.6250 mg | Freq: Once | INTRAMUSCULAR | Status: DC | PRN

## 2023-05-20 MED ORDER — PROPOFOL 10 MG/ML IV BOLUS
INTRAVENOUS | Status: AC
  Filled 2023-05-20: qty 20

## 2023-05-20 MED ORDER — ACETAMINOPHEN 10 MG/ML IV SOLN
INTRAVENOUS | Status: DC | PRN
  Administered 2023-05-20: 1000 mg via INTRAVENOUS

## 2023-05-20 MED ORDER — BUPIVACAINE HCL (PF) 0.5 % IJ SOLN
INTRAMUSCULAR | Status: AC
  Filled 2023-05-20: qty 10

## 2023-05-20 MED ORDER — FENTANYL CITRATE (PF) 100 MCG/2ML IJ SOLN
INTRAMUSCULAR | Status: AC
  Filled 2023-05-20: qty 2

## 2023-05-20 MED ORDER — DEXMEDETOMIDINE HCL IN NACL 80 MCG/20ML IV SOLN
INTRAVENOUS | Status: DC | PRN
  Administered 2023-05-20 (×2): 8 ug via INTRAVENOUS
  Administered 2023-05-20: 4 ug via INTRAVENOUS

## 2023-05-20 SURGICAL SUPPLY — 65 items
BIT DRILL FLUTED 3.0 STRL (BIT) IMPLANT
BLADE SAW SAG 25X90X1.19 (BLADE) ×1 IMPLANT
CHLORAPREP W/TINT 26 (MISCELLANEOUS) ×1 IMPLANT
COOLER POLAR GLACIER W/PUMP (MISCELLANEOUS) ×1 IMPLANT
CUP SUT UNIV REVERS 36+2 RT (Cup) IMPLANT
DRAPE INCISE IOBAN 66X45 STRL (DRAPES) ×1 IMPLANT
DRAPE SHEET LG 3/4 BI-LAMINATE (DRAPES) ×1 IMPLANT
DRAPE TABLE BACK 80X90 (DRAPES) ×1 IMPLANT
DRSG OPSITE POSTOP 4X8 (GAUZE/BANDAGES/DRESSINGS) ×1 IMPLANT
ELECT BLADE 4.0 EZ CLEAN MEGAD (MISCELLANEOUS) ×1
ELECT CAUTERY BLADE 6.4 (BLADE) ×1 IMPLANT
ELECT REM PT RETURN 9FT ADLT (ELECTROSURGICAL) ×1
ELECTRODE BLDE 4.0 EZ CLN MEGD (MISCELLANEOUS) ×1 IMPLANT
ELECTRODE REM PT RTRN 9FT ADLT (ELECTROSURGICAL) ×1 IMPLANT
GAUZE XEROFORM 1X8 LF (GAUZE/BANDAGES/DRESSINGS) ×1 IMPLANT
GLENOID UNI REV MOD 24 +2 LAT (Joint) IMPLANT
GLENOSPHERE 39+4 LAT/24 UNI RV (Joint) IMPLANT
GLOVE BIO SURGEON STRL SZ7.5 (GLOVE) ×4 IMPLANT
GLOVE BIO SURGEON STRL SZ8 (GLOVE) ×4 IMPLANT
GLOVE BIOGEL PI IND STRL 8 (GLOVE) ×2 IMPLANT
GLOVE INDICATOR 8.0 STRL GRN (GLOVE) ×1 IMPLANT
GOWN STRL REUS W/ TWL LRG LVL3 (GOWN DISPOSABLE) ×2 IMPLANT
GOWN STRL REUS W/ TWL XL LVL3 (GOWN DISPOSABLE) ×1 IMPLANT
HANDLE YANKAUER SUCT OPEN TIP (MISCELLANEOUS) ×1 IMPLANT
HOOD PEEL AWAY T7 (MISCELLANEOUS) ×3 IMPLANT
INSERT GLENOID REV 36 +6 /33 (Insert) IMPLANT
IV NS IRRIG 3000ML ARTHROMATIC (IV SOLUTION) ×1 IMPLANT
KIT STABILIZATION SHOULDER (MISCELLANEOUS) ×1 IMPLANT
KIT TURNOVER KIT A (KITS) ×1 IMPLANT
MANIFOLD NEPTUNE II (INSTRUMENTS) ×1 IMPLANT
MASK FACE SPIDER DISP (MASK) ×1 IMPLANT
MAT ABSORB FLUID 56X50 GRAY (MISCELLANEOUS) ×1 IMPLANT
NDL MAYO CATGUT SZ1 (NEEDLE) IMPLANT
NDL SAFETY ECLIPSE 18X1.5 (NEEDLE) ×1 IMPLANT
NDL SPNL 20GX3.5 QUINCKE YW (NEEDLE) ×1 IMPLANT
NEEDLE MAYO CATGUT SZ1 (NEEDLE) IMPLANT
NEEDLE SPNL 20GX3.5 QUINCKE YW (NEEDLE) ×1 IMPLANT
PACK ARTHROSCOPY SHOULDER (MISCELLANEOUS) ×1 IMPLANT
PAD ARMBOARD 7.5X6 YLW CONV (MISCELLANEOUS) ×1 IMPLANT
PAD WRAPON POLAR SHDR UNIV (MISCELLANEOUS) ×1 IMPLANT
PENCIL SMOKE EVACUATOR (MISCELLANEOUS) IMPLANT
PIN NITINOL TARGETER 2.8 (PIN) IMPLANT
PULSAVAC PLUS IRRIG FAN TIP (DISPOSABLE) ×1
SCREW CENTRAL MOD 30MM (Screw) IMPLANT
SCREW PERI LOCK 5.5X24 (Screw) IMPLANT
SCREW PERI LOCK 5.5X36 (Screw) IMPLANT
SCREW PERIPHERAL 5.5X20 LOCK (Screw) IMPLANT
SLING ULTRA II LG (MISCELLANEOUS) IMPLANT
SLING ULTRA II M (MISCELLANEOUS) IMPLANT
SPONGE T-LAP 18X18 ~~LOC~~+RFID (SPONGE) ×2 IMPLANT
STAPLER SKIN PROX 35W (STAPLE) ×1 IMPLANT
STEM HUM UNIV REV 9 (Stem) IMPLANT
SUT ETHIBOND 0 MO6 C/R (SUTURE) ×1 IMPLANT
SUT FIBERWIRE #2 38 BLUE 1/2 (SUTURE) ×4
SUT VIC AB 0 CT1 36 (SUTURE) ×1 IMPLANT
SUT VIC AB 2-0 CT1 TAPERPNT 27 (SUTURE) ×2 IMPLANT
SUT VIC AB 2-0 CT2 27 (SUTURE) IMPLANT
SUTURE FIBERWR #2 38 BLUE 1/2 (SUTURE) ×4 IMPLANT
SYR 10ML LL (SYRINGE) ×1 IMPLANT
SYR 30ML LL (SYRINGE) ×1 IMPLANT
SYR TOOMEY 50ML (SYRINGE) ×1 IMPLANT
TIP FAN IRRIG PULSAVAC PLUS (DISPOSABLE) ×1 IMPLANT
TRAP FLUID SMOKE EVACUATOR (MISCELLANEOUS) ×1 IMPLANT
WATER STERILE IRR 500ML POUR (IV SOLUTION) ×1 IMPLANT
WRAPON POLAR PAD SHDR UNIV (MISCELLANEOUS) ×1

## 2023-05-20 NOTE — H&P (Signed)
History of Present Illness:  Rodney Clark is a 68 y.o. male who presents for follow-up of his right shoulder pain secondary to impingement/tendinopathy with a recurrent rotator cuff tear by ultrasound. The patient was last seen for these symptoms 2.5 months ago. At this visit, he requested and was given a repeat injection into the right shoulder by Dr. Landry Mellow under ultrasound guidance. He notes that this injection provided relief for about 1 month before his symptoms began to recur. He again notes moderate pain in his right shoulder which he rates at 4/10 on today's visit. He has been taking tramadol and Zanaflex as necessary with limited benefit. His symptoms are worse with activities at or above shoulder level as well as when turn to reach behind his back. He also notes pain at night. He denies any reinjury to the shoulder, and denies any numbness or paresthesias down his arm to his hand.  Current Outpatient Medications:  ketoconazole (NIZORAL) 2 % shampoo Apply 2 % topically 3 (three) times a week  famotidine (PEPCID) 20 MG tablet TAKE ONE TABLET EVERY DAY 90 tablet 3  fenofibrate 160 MG tablet take one tablet every day 90 tablet 1  FEXOFENADINE HCL (ALLEGRA ORAL) Take by mouth One daily  gabapentin (NEURONTIN) 300 MG capsule TAKE 1 CAPSULE BY MOUTH 5 TIMES DAILY. 450 capsule 1  hydroCHLOROthiazide (HYDRODIURIL) 12.5 MG tablet take one tablet every day 90 tablet 1  losartan (COZAAR) 50 MG tablet take one tablet every day 90 tablet 1  pantoprazole (PROTONIX) 40 MG DR tablet TAKE 1 TABLET BY MOUTH ONCE DAILY AS DIRECTED. 90 tablet 3  pravastatin (PRAVACHOL) 10 MG tablet TAKE ONE TABLET BY MOUTH EVERY DAY 90 tablet 3  tiZANidine (ZANAFLEX) 4 MG tablet Take 1 tablet (4 mg total) by mouth 3 (three) times daily 270 tablet 3  traMADoL (ULTRAM) 50 mg tablet 1 po bid prn 60 tablet 5  traZODone (DESYREL) 50 MG tablet Take 1 tablet (50 mg total) by mouth at bedtime as needed for Sleep 30 tablet 11    Allergies:  Crestor [Rosuvastatin] Other (Joint pain)   Past Medical History:  Allergic state  allergic rhinitis  Aneurysm of ascending aorta without rupture (CMS-HCC) 09/24/2021 (4.1 cm by CTA 6/23; yearly monitoring recommended  Arthritis (h/o orthopedics evaluation; DDD on CT 8/15)  Atypical chest pain (Myoview 8/14 negative for ischemia)  Epigastric pain 05/2009 (Myoview negative for ischemia. Echo with normal heart function, mild pulmonary hypertension. Improved with H2 blockers) GERD (gastroesophageal reflux disease)  Gout (pt denies)  History of cancer do not recall  Skin cancers  Hyperglycemia 02/15/2014  Hyperlipidemia (did not tolerate Crestor)  Hypertension  Numbness of toes (bilateral, consistent with peripheral neuropathy, likely from arthritis. B-12, TSH previously unremarkable)  Sleep apnea (on bipap)   Past Surgical History:  COLONOSCOPY N/A 05/14/2015 ((10/15/09) Dr. Maggie Font @ [facility]: CBF 05/2020)  BACK SURGERY 2019  POSTERIOR LAMINECTOMY / DECOMPRESSION LUMBAR SPINE 12/2017  L5-S1; Dr. Dutch Quint  EXTRACTION CATARACT EXTRACAPSULAR W/INSERTION INTRAOCULAR PROSTHESIS Left 06/15/2019  Procedure: L- LENSX - EXTRACTION CATARACT EXTRACAPSULAR WITH PHACO WITH INSERTION INTRAOCULAR PROSTHESIS; Surgeon: Kizzie Fantasia, MD; Location: [facility] OR; Service: Ophthalmology; Laterality: Left;  EXTRACTION CATARACT EXTRACAPSULAR W/INSERTION INTRAOCULAR PROSTHESIS Right 07/06/2019  Procedure: R- LENSX - EXTRACTION CATARACT EXTRACAPSULAR WITH PHACO WITH INSERTION INTRAOCULAR PROSTHESIS; Surgeon: Kizzie Fantasia, MD; Location: [facility] OR; Service: Ophthalmology; Laterality: Right;  C5-6, C6-7 anterior cervical discectomy with interbody fusion utilizing interbody cages, locally harvested autograft, and anterior plate  instrumentation 12/24/2021  1. Arthroscopic debridement with abrasion chondroplasty of patella, right knee. 2. Open excision of symptomatic bipartite patellar  fragment, right knee Right 07/30/2022 (Dr. Joice Lofts)  APPENDECTOMY  ARTHROSCOPIC ROTATOR CUFF REPAIR Right  HERNIA REPAIR (inguinal hernia x 2)  SKIN BIOPSY  TONSILLECTOMY   Family History:  Alzheimer's disease Mother Abhijot Straughter  Osteoporosis (Thinning of bones) Mother Promise Bushong  Other Father Mikyle Sox  Bladder polyps  Colon polyps Father Kelvin Sennett  Glaucoma Neg Hx  Macular degeneration Neg Hx   Social History:   Socioeconomic History:  Marital status: Married  Tobacco Use  Smoking status: Never  Smokeless tobacco: Never  Vaping Use  Vaping status: Never Used  Substance and Sexual Activity  Alcohol use: Not Currently  Alcohol/week: 1.0 standard drink of alcohol  Types: 1 Cans of beer per week  Comment: occasionally  Drug use: Never  Sexual activity: Never  Partners: Female  Social History Narrative  Retired with part time job.   Review of Systems:  A comprehensive 14 point ROS was performed, reviewed, and the pertinent orthopaedic findings are documented in the HPI.  Physical Exam: Vitals:  05/01/23 0946 05/01/23 0947  BP: 136/70  Weight: (!) 135.6 kg (299 lb)  Height: 177.8 cm (5\' 10" )  PainSc: 4 4  PainLoc: Shoulder Shoulder   General/Constitutional: Pleasant significantly overweight middle-age male in no acute distress. Neuro/Psych: Normal mood and affect, oriented to person, place and time. Eyes: Non-icteric. Pupils are equal, round, and reactive to light, and exhibit synchronous movement. ENT: Unremarkable. Lymphatic: No palpable adenopathy. Respiratory: Lungs clear to auscultation, Normal chest excursion, No wheezes, and Non-labored breathing Cardiovascular: Regular rate and rhythm. No murmurs. and No edema, swelling or tenderness, except as noted in detailed exam. Integumentary: No impressive skin lesions present, except as noted in detailed exam. Musculoskeletal: Unremarkable, except as noted in detailed exam.  Right shoulder exam: SKIN:  normal SWELLING: none WARMTH: none LYMPH NODES: no adenopathy palpable CREPITUS: none TENDERNESS: Mildly tender along the lateral acromion ROM (active):  Forward flexion: 165 degrees Abduction: 165 degrees Internal rotation: L1 ROM (passive):  Forward flexion: 170 degrees Abduction: 170 degrees  ER/IR at 90 abd: 90 degrees / 65 degrees  He has mild to moderate pain as he moves through 70 to 90 degrees of abduction more so than forward flexion.  STRENGTH: Forward flexion: 4-4+/5 Abduction: 4-4+/5 External rotation: 4-4+/5 Internal rotation: 4+/5 Pain with RC testing: Mild pain with resisted abduction  STABILITY: Normal  SPECIAL TESTS: Juanetta Gosling' test: positive, mild Speed's test: negative Capsulitis - pain w/ passive ER: no Crossed arm test: no Crank: Not evaluated Anterior apprehension: Negative Posterior apprehension: Not evaluated  He is neurovascularly intact to the right upper extremity and hand.  X-rays/MRI/Lab data:  A recent MRI scan of the right shoulder is available for review and has been reviewed by myself. By report, the study demonstrates evidence of a full-thickness tear of the supraspinatus with 3.3 cm of retraction. There is moderate muscle atrophy of the supraspinatus and infraspinatus muscles. The scan also demonstrates evidence of moderate degenerative changes of the glenohumeral joint with moderate tendinopathy of the long head of the biceps tendon. No obvious labral pathology is noted. Both the films and report were reviewed by myself and discussed with the patient.  Assessment: 1. Status post right rotator cuff repair.  2. Rotator cuff tendinitis, right.  3. Rotator cuff arthropathy, right.  4. Nontraumatic complete tear of right rotator cuff.  5. Tendinitis of upper biceps  tendon of right shoulder. 6. Obesity, morbid (CMS/HHS-HCC).    Plan: The treatment options were discussed with the patient. In addition, patient educational materials were  provided regarding the diagnosis and treatment options. The patient is quite frustrated by his symptoms and functional limitations, and is ready to consider more aggressive treatment options. Therefore, I have recommended a surgical procedure, specifically a reverse right total shoulder arthroplasty. The procedure was discussed with the patient, as were the potential risks (including bleeding, infection, nerve and/or blood vessel injury, persistent or recurrent pain, loosening and/or failure of the components, dislocation, need for further surgery, blood clots, strokes, heart attacks and/or arhythmias, pneumonia, etc.) and benefits. The patient states his understanding and wishes to proceed. All of the patient's questions and concerns were answered. He can call any time with further concerns. He will follow up post-surgery, routine.    H&P reviewed and patient re-examined. No changes.

## 2023-05-20 NOTE — Anesthesia Postprocedure Evaluation (Signed)
Anesthesia Post Note  Patient: Rodney Clark  Procedure(s) Performed: REVERSE SHOULDER ARTHROPLASTY WITH BICEPS TENODESIS - RNFA (Right: Shoulder)  Patient location during evaluation: PACU Anesthesia Type: General Level of consciousness: awake and alert Pain management: pain level controlled Vital Signs Assessment: post-procedure vital signs reviewed and stable Respiratory status: spontaneous breathing, nonlabored ventilation, respiratory function stable and patient connected to nasal cannula oxygen Cardiovascular status: blood pressure returned to baseline and stable Postop Assessment: no apparent nausea or vomiting Anesthetic complications: no   No notable events documented.   Last Vitals:  Vitals:   05/20/23 1130 05/20/23 1147  BP: (!) 144/84 133/76  Pulse: 93 94  Resp: 20 20  Temp: 36.4 C (!) 36.1 C  SpO2: 94% 92%    Last Pain:  Vitals:   05/20/23 1148  TempSrc:   PainSc: 0-No pain                 Cleda Mccreedy Ayat Drenning

## 2023-05-20 NOTE — Progress Notes (Signed)
Pt able to walk , eat and tolerating pos, Complained of chest tightness and shortness of breathe, Dr Joice Lofts aware and states his sats are ok and he is good to be discharged,Patient states he felt like this when block was initiated

## 2023-05-20 NOTE — Anesthesia Procedure Notes (Signed)
Procedure Name: Intubation Date/Time: 05/20/2023 8:07 AM  Performed by: Cheral Bay, CRNAPre-anesthesia Checklist: Patient identified, Emergency Drugs available, Suction available and Patient being monitored Patient Re-evaluated:Patient Re-evaluated prior to induction Oxygen Delivery Method: Circle system utilized Preoxygenation: Pre-oxygenation with 100% oxygen Induction Type: IV induction Ventilation: Mask ventilation without difficulty Laryngoscope Size: McGrath and 4 Grade View: Grade I Tube type: Oral Tube size: 7.5 mm Number of attempts: 1 Airway Equipment and Method: Stylet Placement Confirmation: ETT inserted through vocal cords under direct vision, positive ETCO2 and breath sounds checked- equal and bilateral Secured at: 22 cm Tube secured with: Tape Dental Injury: Teeth and Oropharynx as per pre-operative assessment

## 2023-05-20 NOTE — Transfer of Care (Signed)
Immediate Anesthesia Transfer of Care Note  Patient: Rodney Clark  Procedure(s) Performed: REVERSE SHOULDER ARTHROPLASTY WITH BICEPS TENODESIS - RNFA (Right: Shoulder)  Patient Location: PACU  Anesthesia Type:General  Level of Consciousness: awake and drowsy  Airway & Oxygen Therapy: Patient Spontanous Breathing and Patient connected to face mask oxygen  Post-op Assessment: Report given to RN and Post -op Vital signs reviewed and stable  Post vital signs: Reviewed and stable  Last Vitals:  Vitals Value Taken Time  BP 130/58 05/20/23 1041  Temp    Pulse 85 05/20/23 1044  Resp 18 05/20/23 1044  SpO2 92 % 05/20/23 1044  Vitals shown include unfiled device data.  Last Pain:  Vitals:   05/20/23 1610  TempSrc: Oral  PainSc: 3       Patients Stated Pain Goal: 0 (05/20/23 9604)  Complications: No notable events documented.

## 2023-05-20 NOTE — Discharge Instructions (Addendum)
Orthopedic discharge instructions: May shower with intact OpSite dressing once nerve block has worn off (Sunday).  Apply ice frequently to shoulder or use Polar Care device. Take oxycodone as prescribed when needed.  May supplement with ES Tylenol if necessary. Keep shoulder immobilizer on at all times except may remove for bathing purposes. Follow-up in 10-14 days or as scheduled.

## 2023-05-20 NOTE — Anesthesia Preprocedure Evaluation (Addendum)
Anesthesia Evaluation  Patient identified by MRN, date of birth, ID band Patient awake    Reviewed: Allergy & Precautions, NPO status , Patient's Chart, lab work & pertinent test results  Airway Mallampati: IV  TM Distance: >3 FB Neck ROM: Limited    Dental  (+) Missing   Pulmonary sleep apnea and Continuous Positive Airway Pressure Ventilation    Pulmonary exam normal        Cardiovascular Exercise Tolerance: Poor METS (limited due to arthritis): hypertension,  Rhythm:Regular Rate:Normal + Systolic murmurs- Peripheral Edema Denies CP, SOB, DOE. Reports rare mild dizziness with exertion. Denies palpitations  - Thoracic Aneurysm  2/25: Sinus rhythm with Premature atrial complexes Left axis deviation Anteroseptal infarct (cited on or before 23-Jul-2022) Abnormal ECG When compared with ECG of 23-Jul-2022 10:29, Premature atrial complexes are now Present Vent. rate has increased BY 31 BPM Questionable change in initial forces of Anterior leads Confirmed by Windell Norfolk [id number]) on 05/19/2023 12:47:55 PM  MPS 7/23: Gated LV Analysis:  Summary of LV Perfusion: Normal, Summary of LV Function: Normal  TID Ratio:  1.06  LVEF= 58% FINDINGS:  Regional wall motion:  reveals normal myocardial thickening and wall  motion.  The overall quality of the study is good.   Artifacts noted: no  Left ventricular cavity: normal.  Perfusion Analysis:  SPECT images demonstrate homogeneous tracer  distribution throughout the myocardium. Defect type : Normal     Neuro/Psych S/p C5-6, C6-7 anterior cervical discectomy and fusion  Neuromuscular disease (R calf numbness)  negative psych ROS   GI/Hepatic Neg liver ROS,GERD  Controlled and Medicated,,  Endo/Other    Class 3 obesity  Renal/GU negative Renal ROS     Musculoskeletal  (+) Arthritis ,    Abdominal  (+) + obese  Peds  Hematology negative hematology ROS (+)   Anesthesia  Other Findings    Atypical chest pain   GERD (gastroesophageal reflux disease)   Gout   Hyperglycemia   Hyperlipidemia   Essential hypertension   Sleep apnea  wears CPAP Numbness of toes   Squamous cell carcinoma of skin 11/16/2018 left nasal supratip (Dr. Adolphus Birchwood). Tx: LN2, 5FU/Calcipotriene cream Actinic keratosis   Allergy   Neuromuscular disorder (HCC)  nerve damage right leg Squamous cell carcinoma of skin  L temple, txted yrs ago Thoracic aortic aneurysm (HCC)   Pre-diabetes   Elevated PSA   Aneurysm of ascending aorta without rupture (HCC)   Stenosis of cervical spine with myelopathy (HCC)   Spondylolisthesis at L5-S1 level 01/05/2018  Polyradiculitis 09/19/2021  Cholelithiasis 09/19/2021  Sacroiliitis (HCC)    Surgical History   History Date History Date TONSILLECTOMY  APPENDECTOMY  INGUINAL HERNIA REPAIR  ROTATOR CUFF REPAIR  COLONOSCOPY 05/14/2015 CATARACT EXTRACTION W/ INTRAOCULAR LENS IMPLANT 06/15/2019 COLONOSCOPY  POSTERIOR LAMINECTOMY / DECOMPRESSION LUMBAR SPINE 01/05/2018 ANTERIOR CERVICAL DECOMP/DISCECTOMY FUSION 12/24/2021 KNEE ARTHROSCOPY 07/30/2022 CATARACT EXTRACTION W/ INTRAOCULAR LENS IMPLANT 07/06/2019    Substance History   Smoking Status: Never Smokeless Tobacco Status: Never Alcohol use: Not Currently Drug use: Not Currently  Problem List  Current as of 05/20/23 0803 Cervical spondylosis with myelopathy and radiculopathy Cholelithiasis Essential hypertension OSA (obstructive sleep apnea) Obesity, Class III, BMI 40-49.9 (morbid obesity) (HCC) Polyradiculitis Right upper quadrant abdominal pain Sacroiliitis (HCC) Spondylolisthesis at L5-S1 level Thoracic aortic aneurysm without rupture (HCC) Upper abdominal pain  Family History  Problem Relations (Age of Onset) Colon cancer Neg Hx Esophageal cancer Neg Hx Hypertension Mother Rectal cancer Neg Hx Stomach cancer Neg Hx  Reproductive/Obstetrics                               Anesthesia Physical Anesthesia Plan  ASA: 3  Anesthesia Plan: General   Post-op Pain Management: Regional block*   Induction: Intravenous  PONV Risk Score and Plan: 3 and Ondansetron and Dexamethasone  Airway Management Planned: Oral ETT and Video Laryngoscope Planned  Additional Equipment: None  Intra-op Plan:   Post-operative Plan: Extubation in OR  Informed Consent: I have reviewed the patients History and Physical, chart, labs and discussed the procedure including the risks, benefits and alternatives for the proposed anesthesia with the patient or authorized representative who has indicated his/her understanding and acceptance.     Dental advisory given  Plan Discussed with: CRNA  Anesthesia Plan Comments:          Anesthesia Quick Evaluation

## 2023-05-20 NOTE — Evaluation (Signed)
Occupational Therapy Evaluation Patient Details Name: Rodney Clark MRN: [id number] DOB: [birth year] Today's Date: 05/20/2023   History of Present Illness   Pt is a 68 year old male s/p Reverse right total shoulder arthroplasty with biceps tenodesis 05/20/23     Clinical Impressions Patient was seen for an OT evaluation this date. PTA pt is generally MOD I-I in ADL, has prn assist for IADL from wife, amb with no AD.  Patient presents with impaired strength/ROM, pain, and sensation to RUE with block not completely resolved yet. These impairments result in a decreased ability to perform self care tasks requiring MAX Assist for UB dressing (wife able to perform with MIN A) and bathing and MAX assist for application of polar care, compression stockings, and sling/immobilizer. Pt and wife provided education re: in polar care mgt, compression stockings mgt, sling/immobilizer mgt, ROM exercises for RUE, RUE precautions, adaptive strategies for bathing/dressing/toileting/grooming, positioning and considerations for sleep, and home/routines modifications to maximize falls prevention, safety, and independence. Handout provided. OT adjusted sling/immobilizer and polar care to improve comfort, optimize positioning, and to maximize skin integrity/safety. Pt/caregiver verbalized understanding of all education/training provided. Pt will benefit from skilled OT services to address these limitations and improve independence in daily tasks. Recommend follow up per physician protocol.     If plan is discharge home, recommend the following:   A little help with bathing/dressing/bathroom;A little help with walking and/or transfers;Assist for transportation;Help with stairs or ramp for entrance     Functional Status Assessment   Patient has had a recent decline in their functional status and demonstrates the ability to make significant improvements in function in a reasonable and predictable amount of time.      Equipment Recommendations   None recommended by OT     Recommendations for Other Services         Precautions/Restrictions   Precautions Precautions: Fall;Shoulder Shoulder Interventions: Shoulder sling/immobilizer;At all times;Off for dressing/bathing/exercises Precaution Booklet Issued: Yes (comment) Recall of Precautions/Restrictions: Intact Required Braces or Orthoses: Sling Restrictions Weight Bearing Restrictions Per Provider Order: Yes RUE Weight Bearing Per Provider Order: Non weight bearing     Mobility Bed Mobility               General bed mobility comments: NT in recliner pre/post session; pt sleeps in recliner at home    Transfers Overall transfer level: Needs assistance   Transfers: Sit to/from Stand Sit to Stand: Supervision                  Balance Overall balance assessment: Needs assistance Sitting-balance support: Feet supported Sitting balance-Leahy Scale: Good     Standing balance support: No upper extremity supported Standing balance-Leahy Scale: Good                             ADL either performed or assessed with clinical judgement   ADL Overall ADL's : Needs assistance/impaired Eating/Feeding: Set up;Sitting   Grooming: Sitting;Supervision/safety           Upper Body Dressing : Maximal assistance Upper Body Dressing Details (indicate cue type and reason): sling; wife performs with MIN A, frequent vcs from this therapist; Lower Body Dressing: Minimal assistance Lower Body Dressing Details (indicate cue type and reason): pt already dressed when therapist in for eval, anticipate Toilet Transfer: Supervision/safety;Contact guard assist Toilet Transfer Details (indicate cue type and reason): simulated, no vcs required Toileting- Clothing Manipulation and Hygiene: Contact guard assist Toileting -  Clothing Manipulation Details (indicate cue type and reason): anticipate     Functional mobility during  ADLs: Supervision/safety;Contact guard assist (approx 100' with no AD) General ADL Comments: pt performed 4 stairs with supervision     Vision Patient Visual Report: No change from baseline       Perception         Praxis         Pertinent Vitals/Pain Pain Assessment Pain Assessment: 0-10 Pain Score: 3  Pain Location: incisional Pain Descriptors / Indicators: Discomfort Pain Intervention(s): Limited activity within patient's tolerance, Ice applied, Repositioned     Extremity/Trunk Assessment Upper Extremity Assessment Upper Extremity Assessment: Right hand dominant;RUE deficits/detail RUE Deficits / Details: shoulder NT; elbow AROM 1/4 full AROM, forearm/wrist/hand 1/2 full AROM; elbow,wrist, hand PROM WFL RUE Sensation: decreased light touch RUE Coordination: decreased fine motor;decreased gross motor   Lower Extremity Assessment Lower Extremity Assessment: Overall WFL for tasks assessed       Communication Communication Communication: No apparent difficulties   Cognition Arousal: Alert Behavior During Therapy: WFL for tasks assessed/performed Cognition: No apparent impairments             OT - Cognition Comments: pt is a bit irritated in general, but agreeable to all tasks during OT evaluation                 Following commands: Intact       Cueing  General Comments   Cueing Techniques: Verbal cues;Tactile cues;Visual cues      Exercises     Shoulder Instructions      Home Living Family/patient expects to be discharged to:: Private residence Living Arrangements: Spouse/significant other Available Help at Discharge: Family;Available 24 hours/day Type of Home: House Home Access: Stairs to enter [employer] of Steps: 2 Entrance Stairs-Rails: Can reach both Home Layout: Two level;Able to live on main level with bedroom/bathroom Alternate Level Stairs-Number of Steps: flight Alternate Level Stairs-Rails: Right Bathroom  Shower/Tub: Chief Strategy Officer: Standard     Home Equipment: Agricultural consultant (2 wheels);Shower seat;BSC/3in1          Prior Functioning/Environment Prior Level of Function : Independent/Modified Independent             Mobility Comments: amb with no AD; works part time ADLs Comments: MOD I with ADL, prn assist for IADL from wife who is available all the time;    OT Problem List: Decreased activity tolerance;Impaired balance (sitting and/or standing)   OT Treatment/Interventions: Self-care/ADL training;Therapeutic exercise;Patient/family education;DME and/or AE instruction;Energy conservation;Therapeutic activities      OT Goals(Current goals can be found in the care plan section)   Acute Rehab OT Goals Patient Stated Goal: go home asap OT Goal Formulation: With patient Time For Goal Achievement: 06/03/23 Potential to Achieve Goals: Good ADL Goals Pt Will Perform Grooming: with modified independence;sitting Pt Will Perform Upper Body Dressing: with caregiver independent in assisting;with min assist;sitting Pt Will Perform Lower Body Dressing: with min assist;sitting/lateral leans Pt Will Transfer to Toilet: ambulating;with modified independence Pt Will Perform Toileting - Clothing Manipulation and hygiene: with modified independence;sitting/lateral leans;sit to/from stand   OT Frequency:  Min 1X/week    Co-evaluation              AM-PAC OT "6 Clicks" Daily Activity     Outcome Measure Help from another person eating meals?: None Help from another person taking care of personal grooming?: None Help from another person toileting, which includes using toliet, bedpan, or  urinal?: A Little Help from another person bathing (including washing, rinsing, drying)?: A Little Help from another person to put on and taking off regular upper body clothing?: A Lot Help from another person to put on and taking off regular lower body clothing?: A Little 6 Click  Score: 19   End of Session Equipment Utilized During Treatment: Gait belt Nurse Communication: Mobility status  Activity Tolerance: Patient tolerated treatment well Patient left: in chair  OT Visit Diagnosis: Other abnormalities of gait and mobility (R26.89)                Time: 4098-1191 OT Time Calculation (min): 34 min Charges:  OT General Charges $OT Visit: 1 Visit OT Evaluation $OT Eval Moderate Complexity: 1 Mod  Oleta Mouse, OTD OTR/L  05/20/23, 2:22 PM

## 2023-05-20 NOTE — Op Note (Signed)
05/20/2023  10:34 AM  Patient:   Rodney Clark  Pre-Op Diagnosis:   Recurrent rotator cuff tear with cuff arthropathy and biceps tendinopathy, right shoulder.  Post-Op Diagnosis:   Same  Procedure:   Reverse right total shoulder arthroplasty with biceps tenodesis.  Surgeon:   Maryagnes Amos, MD  Assistant:   Griffin Basil, RNFA  Anesthesia:   General endotracheal with an interscalene block using Exparel placed preoperatively by the anesthesiologist.  Findings:   As above.  Complications:   None  EBL:   75 cc  Fluids:   700 cc crystalloid  UOP:   None  TT:   None  Drains:   None  Closure:   Staples  Implants:   All press-fit Arthrex system with a #9 [employer] Revers humeral stem, a 36/39 mm SutureCup with a +3 insert, and a 24 mm base plate with a 39 mm +4 mm lateralized glenosphere.  Brief Clinical Note:   The patient is a 68 year old male with a history of progressively worsening pain and weakness of the right shoulder. His symptoms have progressed despite medications, activity modification, etc. His history and examination are consistent with a recurrent rotator cuff tear with moderate cuff arthropathy and biceps tendinopathy. The patient presents at this time for a reverse right total shoulder arthroplasty with biceps tenodesis.  Procedure:   The patient underwent placement of an interscalene block using Exparel by the anesthesiologist in the preoperative holding area before being brought into the operating room and lain in the supine position. The patient then underwent general endotracheal intubation and anesthesia before the patient was repositioned in the beach chair position using the beach chair positioner. The right shoulder and upper extremity were prepped with ChloraPrep solution before being draped sterilely. Preoperative antibiotics were administered. A timeout was performed to verify the appropriate surgical site.    A standard anterior approach to the shoulder  was made through an approximately 4-5 inch incision. The incision was carried down through the subcutaneous tissues to expose the deltopectoral fascia. The interval between the deltoid and pectoralis muscles was identified and this plane developed, retracting the cephalic vein laterally with the deltoid muscle. The conjoined tendon was identified. Its lateral margin was dissected and the Kolbel self-retraining retractor inserted. The "three sisters" were identified and cauterized. Bursal tissues were removed to improve visualization.   The biceps tendon was identified near the inferior aspect of the bicipital groove. A soft tissue tenodesis was performed by attaching the biceps tendon to the adjacent pectoralis major tendon using two #0 Ethibond interrupted sutures. The biceps tendon was then transected just proximal to the tenodesis site. The subscapularis tendon was released from its attachment to the lesser tuberosity 1 cm proximal to its insertion and several tagging sutures placed. The inferior capsule was released with care after identifying and protecting the axillary nerve. The proximal humeral cut was made at approximately 30 of retroversion using the extra-medullary guide.   Attention was redirected to the glenoid. The labrum was debrided circumferentially before the center of the glenoid was marked with electrocautery. The guidewire was drilled into the glenoid neck using the appropriate guide. After verifying its position, it was overreamed with the baseplate reamer to create a flat surface before the peripheral reamer was introduced to clean off any peripheral soft tissues. The central 10 mm coring reamer was then inserted before the hole was tapped to complete the glenoid preparation. The permanent mini-baseplate construct with a 30 mm screw attachment was screwed into  place and tightened securely. The baseplate was then further secured using four peripheral screws. Locking screws were placed  superiorly and inferiorly while one non-locking screw was placed posteriorly. The permanent 39 mm +4 mm lateralized glenosphere was then impacted into place and its Morse taper locking mechanism verified using manual distraction. Finally, the glenosphere locking screw was inserted and tightened securely.  Attention was directed to the humeral side. The humeral canal was reamed with first the 5 mm and then the 6 mm reamer. The canal was broached beginning with a #5 broach and progressing to a #9 broach. This was left in place and the metaphyseal inset reamer was used to create the metaphyseal socket.  A trial reduction performed using the 36 mm trial humeral platform with the +3 mm insert. The arm demonstrated excellent range of motion as the hand could be brought across the chest to the opposite shoulder and brought to the top of the patient's head and to the patient's ear. The shoulder appeared stable throughout this range of motion. The joint was dislocated and the trial components removed.   The permanent #9 [employer] Revers stem was impacted into place with care taken to maintain the appropriate version. The permanent 36/39 mm SutureCup humeral platform was then impacted into place before the +3 mm trial insert was positioned. The shoulder was placed through a range of motion, again demonstrating the findings described above. The shoulder was redislocated and the trial insert removed. The permanent +3 mm insert was then snapped into place. The shoulder was relocated using two finger pressure and again placed through a range of motion with the findings as described above.  The wound was copiously irrigated with sterile saline solution using the jet lavage system before a total of 30 cc of 0.5% Sensorcaine with epinephrine was injected into the pericapsular and peri-incisional tissues to help with postoperative analgesia. The subscapularis tendon was reapproximated using #2 FiberWire interrupted sutures. The  deltopectoral interval was closed using #0 Vicryl interrupted sutures before the subcutaneous tissues were closed using 2-0 Vicryl interrupted sutures. The skin was closed using staples. Prior to closing the skin, 1 g of transexemic acid in 10 cc of normal saline was injected intra-articularly to help with postoperative bleeding. A sterile occlusive dressing was applied to the wound before the arm was placed into a shoulder immobilizer with an abduction pillow. A Polar Care system also was applied to the shoulder. The patient was then transferred back to a hospital bed before being awakened, extubated, and returned to the recovery room in satisfactory condition after tolerating the procedure well.

## 2023-05-22 ENCOUNTER — Encounter: Payer: Self-pay | Admitting: Surgery

## 2023-05-23 DIAGNOSIS — I272 Pulmonary hypertension, unspecified: Secondary | ICD-10-CM | POA: Diagnosis not present

## 2023-05-23 DIAGNOSIS — K219 Gastro-esophageal reflux disease without esophagitis: Secondary | ICD-10-CM | POA: Diagnosis not present

## 2023-05-23 DIAGNOSIS — E785 Hyperlipidemia, unspecified: Secondary | ICD-10-CM | POA: Diagnosis not present

## 2023-05-23 DIAGNOSIS — M519 Unspecified thoracic, thoracolumbar and lumbosacral intervertebral disc disorder: Secondary | ICD-10-CM | POA: Diagnosis not present

## 2023-05-23 DIAGNOSIS — M109 Gout, unspecified: Secondary | ICD-10-CM | POA: Diagnosis not present

## 2023-05-23 DIAGNOSIS — Z6841 Body Mass Index (BMI) 40.0 and over, adult: Secondary | ICD-10-CM | POA: Diagnosis not present

## 2023-05-23 DIAGNOSIS — I10 Essential (primary) hypertension: Secondary | ICD-10-CM | POA: Diagnosis not present

## 2023-05-23 DIAGNOSIS — I7121 Aneurysm of the ascending aorta, without rupture: Secondary | ICD-10-CM | POA: Diagnosis not present

## 2023-05-23 DIAGNOSIS — Z471 Aftercare following joint replacement surgery: Secondary | ICD-10-CM | POA: Diagnosis not present

## 2023-05-29 DIAGNOSIS — Z96611 Presence of right artificial shoulder joint: Secondary | ICD-10-CM | POA: Diagnosis not present

## 2023-05-29 DIAGNOSIS — M25611 Stiffness of right shoulder, not elsewhere classified: Secondary | ICD-10-CM | POA: Diagnosis not present

## 2023-05-29 DIAGNOSIS — M6281 Muscle weakness (generalized): Secondary | ICD-10-CM | POA: Diagnosis not present

## 2023-05-29 DIAGNOSIS — M25511 Pain in right shoulder: Secondary | ICD-10-CM | POA: Diagnosis not present

## 2023-06-01 DIAGNOSIS — M25511 Pain in right shoulder: Secondary | ICD-10-CM | POA: Diagnosis not present

## 2023-06-01 DIAGNOSIS — Z96611 Presence of right artificial shoulder joint: Secondary | ICD-10-CM | POA: Diagnosis not present

## 2023-06-03 DIAGNOSIS — G4733 Obstructive sleep apnea (adult) (pediatric): Secondary | ICD-10-CM | POA: Diagnosis not present

## 2023-06-04 DIAGNOSIS — Z471 Aftercare following joint replacement surgery: Secondary | ICD-10-CM | POA: Diagnosis not present

## 2023-06-08 DIAGNOSIS — Z96611 Presence of right artificial shoulder joint: Secondary | ICD-10-CM | POA: Diagnosis not present

## 2023-06-08 DIAGNOSIS — M25511 Pain in right shoulder: Secondary | ICD-10-CM | POA: Diagnosis not present

## 2023-06-09 ENCOUNTER — Encounter: Payer: Self-pay | Admitting: Urology

## 2023-06-09 ENCOUNTER — Ambulatory Visit: Payer: PPO | Admitting: Urology

## 2023-06-09 VITALS — BP 146/81 | Ht 70.0 in | Wt 296.0 lb

## 2023-06-09 DIAGNOSIS — R972 Elevated prostate specific antigen [PSA]: Secondary | ICD-10-CM

## 2023-06-09 NOTE — Patient Instructions (Signed)
 Prostate Cancer Screening  Prostate cancer screening is testing that is done to check for the presence of prostate cancer in men. The prostate gland is a walnut-sized gland that is located below the bladder and in front of the rectum in males. The function of the prostate is to add fluid to semen during ejaculation. Prostate cancer is one of the most common types of cancer in men. Who should have prostate cancer screening? Screening recommendations vary based on age and other risk factors, as well as between the professional organizations who make the recommendations. In general, screening is recommended if: You are age 59 to 46 and have an average risk for prostate cancer. You should talk with your health care provider about your need for screening and how often screening should be done. Because most prostate cancers are slow growing and will not cause death, screening in this age group is generally reserved for men who have a 10- to 15-year life expectancy. You are younger than age 64, and you have these risk factors: Having a father, brother, or uncle who has been diagnosed with prostate cancer. The risk is higher if your family member's cancer occurred at an early age or if you have multiple family members with prostate cancer at an early age. Being a male who is [place] or is of [place] or sub-Saharan African descent. In general, screening is not recommended if: You are younger than age 71. You are between the ages of 93 and 30 and you have no risk factors. You are 74 years of age or older. At this age, the risks that screening can cause are greater than the benefits that it may provide. If you are at high risk for prostate cancer, your health care provider may recommend that you have screenings more often or that you start screening at a younger age. How is screening for prostate cancer done? The recommended prostate cancer screening test is a blood test called the prostate-specific antigen  (PSA) test. PSA is a protein that is made in the prostate. As you age, your prostate naturally produces more PSA. Abnormally high PSA levels may be caused by: Prostate cancer. An enlarged prostate that is not caused by cancer (benign prostatic hyperplasia, or BPH). This condition is very common in older men. A prostate gland infection (prostatitis) or urinary tract infection. Certain medicines such as male hormones (like testosterone) or other medicines that raise testosterone levels. A rectal exam may be done as part of prostate cancer screening to help provide information about the size of your prostate gland. When a rectal exam is performed, it should be done after the PSA level is drawn to avoid any effect on the results. Depending on the PSA results, you may need more tests, such as: A physical exam to check the size of your prostate gland, if not done as part of screening. Blood and imaging tests. A procedure to remove tissue samples from your prostate gland for testing (biopsy). This is the only way to know for certain if you have prostate cancer. What are the benefits of prostate cancer screening? Screening can help to identify cancer at an early stage, before symptoms start and when the cancer can be treated more easily. There is a small chance that screening may lower your risk of dying from prostate cancer. The chance is small because prostate cancer is a slow-growing cancer, and most men with prostate cancer die from a different cause. What are the risks of prostate cancer screening? The  main risk of prostate cancer screening is diagnosing and treating prostate cancer that would never have caused any symptoms or problems. This is called overdiagnosisand overtreatment. PSA screening cannot tell you if your PSA is high due to cancer or a different cause. A prostate biopsy is the only procedure to diagnose prostate cancer. Even the results of a biopsy may not tell you if your cancer needs to  be treated. Slow-growing prostate cancer may not need any treatment other than monitoring, so diagnosing and treating it may cause unnecessary stress or other side effects. Questions to ask your health care provider When should I start prostate cancer screening? What is my risk for prostate cancer? How often do I need screening? What type of screening tests do I need? How do I get my test results? What do my results mean? Do I need treatment? Where to find more information The American Cancer Society: www.cancer.org American Urological Association: www.auanet.org Contact a health care provider if: You have difficulty urinating. You have pain when you urinate or ejaculate. You have blood in your urine or semen. You have pain in your back or in the area of your prostate. Summary Prostate cancer is a common type of cancer in men. The prostate gland is located below the bladder and in front of the rectum. This gland adds fluid to semen during ejaculation. Prostate cancer screening may identify cancer at an early stage, when the cancer can be treated more easily and is less likely to have spread to other areas of the body. The prostate-specific antigen (PSA) test is the recommended screening test for prostate cancer, but it has associated risks. Discuss the risks and benefits of prostate cancer screening with your health care provider. If you are age 73 or older, the risks that screening can cause are greater than the benefits that it may provide. This information is not intended to replace advice given to you by your health care provider. Make sure you discuss any questions you have with your health care provider. Document Revised: 09/10/2020 Document Reviewed: 09/10/2020 Elsevier Patient Education  2024 [employer].

## 2023-06-09 NOTE — Progress Notes (Signed)
 06/09/23 1:30 PM   Rodney Clark [birth year] [id number]  CC: Elevated PSA  HPI: 68 year old male referred for multiple elevated PSA values over the last year, including 9.5 from January 2025, 6.5 from July 2024, and 7.2 from January 2024.  This increased from a baseline value of approximately 2 from 2023.  He denies any significant urinary symptoms, no gross hematuria, no family history of prostate cancer.  Recently had shoulder surgery.   PMH: Past Medical History:  Diagnosis Date   Actinic keratosis    Allergic state    Allergy    Aneurysm of ascending aorta without rupture (HCC)    Arthritis    Atypical chest pain    Cholelithiasis 09/19/2021   Elevated PSA    Essential hypertension    GERD (gastroesophageal reflux disease)    Gout    Hyperglycemia    Hyperlipidemia    Neuromuscular disorder (HCC)    nerve damage right leg   Numbness of toes    Polyradiculitis 09/19/2021   Pre-diabetes    Sacroiliitis (HCC)    Sleep apnea    wears CPAP   Spondylolisthesis at L5-S1 level 01/05/2018   Squamous cell carcinoma of skin 11/16/2018   left nasal supratip (Dr. Adolphus Birchwood). Tx: LN2, 5FU/Calcipotriene cream   Squamous cell carcinoma of skin    L temple, txted yrs ago   Stenosis of cervical spine with myelopathy (HCC)    Thoracic aortic aneurysm [facility])     Surgical History: Past Surgical History:  Procedure Laterality Date   ANTERIOR CERVICAL DECOMP/DISCECTOMY FUSION N/A 12/24/2021   Procedure: Anterior Cervical Decompression Fusion  Cervical five-Cervical six - Cervical six-Cervical seven;  Surgeon: Julio Sicks, MD;  Location: [facility] OR;  Service: Neurosurgery;  Laterality: N/A;   APPENDECTOMY     CATARACT EXTRACTION W/ INTRAOCULAR LENS IMPLANT Left 06/15/2019   CATARACT EXTRACTION W/ INTRAOCULAR LENS IMPLANT Right 07/06/2019   COLONOSCOPY N/A 05/14/2015   Procedure: COLONOSCOPY;  Surgeon: Scot Jun, MD;  Location: [facility] ENDOSCOPY;  Service: Endoscopy;  Laterality:  N/A;   COLONOSCOPY     INGUINAL HERNIA REPAIR Right    x2   KNEE ARTHROSCOPY Right 07/30/2022   Procedure: RIGHT KNEE ARTHROSCOPY WITH DEBRIDEMENT AND EXCISION OF SYMPTOMATIC BIPARTITE FRAGMENT.;  Surgeon: Christena Flake, MD;  Location: [facility] ORS;  Service: Orthopedics;  Laterality: Right;   POSTERIOR LAMINECTOMY / DECOMPRESSION LUMBAR SPINE  01/05/2018   L5-S1   REVERSE SHOULDER ARTHROPLASTY Right 05/20/2023   Procedure: REVERSE SHOULDER ARTHROPLASTY WITH BICEPS TENODESIS - RNFA;  Surgeon: Christena Flake, MD;  Location: [facility] ORS;  Service: Orthopedics;  Laterality: Right;   ROTATOR CUFF REPAIR Right    TONSILLECTOMY     Family History: Family History  Problem Relation Age of Onset   Hypertension Mother    Colon cancer Neg Hx    Esophageal cancer Neg Hx    Rectal cancer Neg Hx    Stomach cancer Neg Hx     Social History:  reports that he has never smoked. He has never used smokeless tobacco. He reports that he does not currently use alcohol after a past usage of about 3.0 standard drinks of alcohol per week. He reports that he does not currently use drugs.  Physical Exam: BP (!) 146/81   Ht 5\' 10"  (1.778 m)   Wt 296 lb (134.3 kg)   BMI 42.47 kg/m    Constitutional:  Alert and oriented, No acute distress. Cardiovascular: No clubbing, cyanosis, or edema. Respiratory: Normal respiratory  effort, no increased work of breathing. GI: Abdomen is soft, nontender, nondistended, no abdominal masses   Laboratory Data: PSA history reviewed, see HPI  Pertinent Imaging: I have personally viewed and interpreted the CT scan from June 2023 with prostate measuring 25 g.  Assessment & Plan:   68 year old male with multiple elevated and variable PSA values over the last year.  We reviewed the implications of an elevated PSA and the uncertainty surrounding it. In general, a man's PSA increases with age and is produced by both normal and cancerous prostate tissue. The differential diagnosis for  elevated PSA includes BPH, prostate cancer, infection/prostatitis, recent intercourse/ejaculation, recent urethroscopic manipulation (foley placement/cystoscopy) or trauma. Management of an elevated PSA can include observation/surveillance, prostate MRI, or prostate biopsy and we discussed this in detail. Our goal is to detect clinically significant prostate cancers, and manage with either active surveillance, surgery, or radiation for localized disease. Risks of prostate biopsy include bleeding, infection (including life threatening sepsis), pain, and lower urinary symptoms. Hematuria, hematospermia, and blood in the stool are all common after biopsy and can persist up to 4 weeks.  Using shared decision making he opted for a PHI score with the understanding that if PSA remains elevated need to proceed with either prostate MRI or prostate biopsy, he prefers to pursue prostate MRI first if needed then biopsy  Legrand Rams, MD 06/09/2023  [facility] [address] [place], [place] [address] [phone]

## 2023-06-11 LAB — PROSTATE HEALTH INDEX: Prostate Specific Ag: 11.4 ng/mL — ABNORMAL HIGH (ref 0.0–3.9)

## 2023-06-11 LAB — REFLEX INFORMATION

## 2023-06-15 DIAGNOSIS — M25511 Pain in right shoulder: Secondary | ICD-10-CM | POA: Diagnosis not present

## 2023-06-15 DIAGNOSIS — Z96611 Presence of right artificial shoulder joint: Secondary | ICD-10-CM | POA: Diagnosis not present

## 2023-06-16 DIAGNOSIS — R972 Elevated prostate specific antigen [PSA]: Secondary | ICD-10-CM

## 2023-06-16 NOTE — Telephone Encounter (Signed)
 MRI ordered. Instructions sent.

## 2023-06-22 DIAGNOSIS — Z96611 Presence of right artificial shoulder joint: Secondary | ICD-10-CM | POA: Diagnosis not present

## 2023-06-29 DIAGNOSIS — R221 Localized swelling, mass and lump, neck: Secondary | ICD-10-CM | POA: Diagnosis not present

## 2023-06-29 DIAGNOSIS — M25511 Pain in right shoulder: Secondary | ICD-10-CM | POA: Diagnosis not present

## 2023-06-29 DIAGNOSIS — R7303 Prediabetes: Secondary | ICD-10-CM | POA: Diagnosis not present

## 2023-06-29 DIAGNOSIS — I7121 Aneurysm of the ascending aorta, without rupture: Secondary | ICD-10-CM | POA: Diagnosis not present

## 2023-06-29 DIAGNOSIS — M5090 Cervical disc disorder, unspecified, unspecified cervical region: Secondary | ICD-10-CM | POA: Diagnosis not present

## 2023-06-29 DIAGNOSIS — Z96611 Presence of right artificial shoulder joint: Secondary | ICD-10-CM | POA: Diagnosis not present

## 2023-06-29 DIAGNOSIS — I10 Essential (primary) hypertension: Secondary | ICD-10-CM | POA: Diagnosis not present

## 2023-06-30 ENCOUNTER — Ambulatory Visit: Admitting: Urology

## 2023-06-30 ENCOUNTER — Other Ambulatory Visit: Payer: Self-pay | Admitting: Internal Medicine

## 2023-06-30 VITALS — BP 122/72 | HR 56

## 2023-06-30 DIAGNOSIS — R972 Elevated prostate specific antigen [PSA]: Secondary | ICD-10-CM

## 2023-06-30 DIAGNOSIS — R221 Localized swelling, mass and lump, neck: Secondary | ICD-10-CM

## 2023-06-30 MED ORDER — DIAZEPAM 10 MG PO TABS: 10.0000 mg | ORAL_TABLET | Freq: Once | ORAL | 0 refills | Status: DC | PRN

## 2023-06-30 NOTE — Progress Notes (Signed)
   06/30/2023 10:28 AM   Rodney Clark 04-Jun-1955 [id number]  Reason for visit: Elevated PSA, discuss options  HPI: 68 year old male with persistently rising PSA over the last year including 9.5 from January 2025, 6.03 October 2022, 7.01 April 2022 from a baseline value of approximately 2 in 2023.  He does not have any urinary symptoms, gross hematuria, or family history of prostate cancer.  He recently had a right shoulder surgery, currently on Keflex for a small skin infection.  At our last visit we opted for a PHI score for better risk stratification, however PSA was 11.4 outside the range of 4-10 where a 5 score can be performed.  He originally had opted for a prostate MRI, but changed his mind and wanted to discuss MRI versus biopsy today.  We reviewed the AUA guidelines again regarding elevated PSA values, and based on his trend I certainly recommended further evaluation with either a transrectal ultrasound-guided biopsy or prostate MRI. Management of an elevated PSA can include observation/surveillance, prostate MRI, or prostate biopsy and we discussed this in detail. Our goal is to detect clinically significant prostate cancers, and manage with either active surveillance, surgery, or radiation for localized disease. Risks of prostate biopsy include bleeding, infection (including life threatening sepsis), pain, and lower urinary symptoms. Hematuria, hematospermia, and blood in the stool are all common after biopsy and can persist up to 4 weeks.  He had a number of concerns about the procedure and sedation options, we discussed that we do not have any access to outpatient sedation for clinic procedures through [facility] at this time.  [facility] [place] that offers nitrous, as well as to [facility] for their potential new sedation procedure clinic.  He was frustrated about the situation and needing a biopsy as well as risk of prostate cancer, and I offered  him a referral to different urologist multiple times if he was unhappy with his care here, but he would like to proceed with standard ultrasound-guided biopsy here.  Using shared decision making, he opted to proceed directly to transrectal ultrasound-guided biopsy 10 mg Valium prior   Sondra Come, MD  [facility] [address], [address] [place], [place] [address] [phone]

## 2023-06-30 NOTE — Patient Instructions (Signed)
 Prostate Biopsy Instructions  Stop all aspirin or blood thinners (aspirin, plavix, coumadin, warfarin, motrin, ibuprofen, advil, aleve, naproxen, naprosyn) for 7 days prior to the procedure.  If you have any questions about stopping these medications, please contact your primary care physician or cardiologist.  Having a light meal prior to the procedure is recommended.  If you are diabetic or have low blood sugar please bring a small snack or glucose tablet.  A Fleets enema is needed to be purchased over the counter at a local pharmacy and used 2 hours before you scheduled appointment.  This can be purchased over the counter at any pharmacy.  Antibiotics will be administered in the clinic at the time of the procedure unless otherwise specified.    Please bring someone with you to the procedure to drive you home.  A follow up appointment has been scheduled for you to receive the results of the biopsy.  If you have any questions or concerns, please feel free to call the office at [phone] or send a Mychart message.    Thank you, Staff at [facility]  Transrectal Ultrasound-Guided Prostate Biopsy A transrectal ultrasound-guided prostate biopsy is a procedure to remove samples of prostate tissue for testing. The prostate is a walnut-sized gland that is located below the bladder and in front of the rectum. During this procedure, a small device (probe) is lubricated and put inside the rectum. The probe sends out sound waves that make a picture of the prostate and surrounding tissues (transrectal ultrasound). The images are used to help guide the process of removing the samples. The samples are taken to a lab to be checked for prostate cancer. This procedure is usually done to evaluate the prostate gland of men who have raised (elevated) levels of prostate-specific antigen (PSA), which can be a sign of prostate cancer or prostate enlargement related to aging (benign prostatic  hyperplasia, or BPH). Tell a health care provider about: Any allergies you have. All medicines you are taking, including vitamins, herbs, eye drops, creams, and over-the-counter medicines. Any problems you or family members have had with anesthetic medicines. Any bleeding problems you have. Any surgeries you have had. Any medical conditions you have. Any prostate infections you have had. What are the risks? Generally, this is a safe procedure. However, problems may occur, including: Prostate infection. Bleeding from the rectum. Blood in the urine. Allergic reactions to medicines. Damage to surrounding structures such as blood vessels, organs, or muscles. Difficulty passing urine. Nerve damage. This is usually temporary. What happens before the procedure? Medicines Ask your health care provider about: Changing or stopping your regular medicines. This is especially important if you are taking diabetes medicines or blood thinners. Taking medicines such as aspirin and ibuprofen. These medicines can thin your blood. Do not take these medicines unless your health care provider tells you to take them. Taking over-the-counter medicines, vitamins, herbs, and supplements. General instructions Follow instructions from your health care provider about eating and drinking. In most instances, you will not need to stop eating and drinking completely before the procedure. You will be given an enema. During an enema, a liquid is injected into your rectum to clear out waste. You may have a blood or urine sample taken. Ask your health care provider what steps will be taken to help prevent infection. These steps may include: Washing skin with a germ-killing soap. Taking antibiotic medicine. If you will be going home right after the procedure, plan to have a responsible adult:  Take you home from the hospital or clinic. You will not be allowed to drive. Care for you for the time you are told. What happens  during the procedure?  An IV will be inserted into one of your veins. You will be given one or both of the following: A medicine to help you relax (sedative). A medicine to numb the area (local anesthetic). You will be placed on your left side, and your knees will be bent toward your chest. A probe with lubricated gel will be placed into your rectum, and images will be taken of your prostate and surrounding structures. Numbing medicine will be injected into your prostate. A biopsy needle will be inserted through your rectum or perineum and guided to your prostate using the ultrasound images. Prostate tissue samples will be removed, and the needle and probe will then be removed. The biopsy samples will be sent to a lab to be tested. The procedure may vary among health care providers and hospitals. What happens after the procedure? Your blood pressure, heart rate, breathing rate, and blood oxygen level will be monitored until you leave the hospital or clinic. You may have some discomfort in the rectal area. You will be given pain medicine as needed. If you were given a sedative during the procedure, it can affect you for several hours. Do not drive or operate machinery until your health care provider says that it is safe. It is up to you to get the results of your procedure. Ask your health care provider, or the department that is doing the procedure, when your results will be ready. Keep all follow-up visits. This is important. Summary A transrectal ultrasound-guided biopsy removes samples of tissue from your prostate using ultrasound-guided sound waves to help guide the process. This procedure is usually done to evaluate the prostate gland of men who have raised (elevated) levels of prostate-specific antigen (PSA), which can be a sign of prostate cancer or prostate enlargement related to aging. After your procedure, you may feel some discomfort in the rectal area. Plan to have a responsible  adult take you home from the hospital or clinic, and follow up with your health care provider for your results. This information is not intended to replace advice given to you by your health care provider. Make sure you discuss any questions you have with your health care provider. Document Revised: 09/10/2020 Document Reviewed: 09/10/2020 Elsevier Patient Education  2024 [employer].

## 2023-07-03 DIAGNOSIS — Z96611 Presence of right artificial shoulder joint: Secondary | ICD-10-CM | POA: Diagnosis not present

## 2023-07-04 DIAGNOSIS — G4733 Obstructive sleep apnea (adult) (pediatric): Secondary | ICD-10-CM | POA: Diagnosis not present

## 2023-07-06 DIAGNOSIS — Z96611 Presence of right artificial shoulder joint: Secondary | ICD-10-CM | POA: Diagnosis not present

## 2023-07-06 DIAGNOSIS — M25511 Pain in right shoulder: Secondary | ICD-10-CM | POA: Diagnosis not present

## 2023-07-07 ENCOUNTER — Ambulatory Visit
Admission: RE | Admit: 2023-07-07 | Discharge: 2023-07-07 | Disposition: A | Discharge status: Home / Self Care | Source: Ambulatory Visit | Attending: Internal Medicine | Admitting: Internal Medicine

## 2023-07-07 DIAGNOSIS — R490 Dysphonia: Secondary | ICD-10-CM | POA: Diagnosis not present

## 2023-07-07 DIAGNOSIS — R221 Localized swelling, mass and lump, neck: Secondary | ICD-10-CM | POA: Insufficient documentation

## 2023-07-07 DIAGNOSIS — Z981 Arthrodesis status: Secondary | ICD-10-CM | POA: Diagnosis not present

## 2023-07-07 DIAGNOSIS — R220 Localized swelling, mass and lump, head: Secondary | ICD-10-CM | POA: Diagnosis not present

## 2023-07-07 MED ORDER — IOHEXOL 300 MG/ML  SOLN
75.0000 mL | Freq: Once | INTRAMUSCULAR | Status: AC | PRN
  Administered 2023-07-07: 75 mL via INTRAVENOUS

## 2023-07-07 MED ORDER — SODIUM CHLORIDE 0.9 % IV SOLN: INTRAVENOUS | Status: DC

## 2023-07-09 ENCOUNTER — Ambulatory Visit: Admitting: Urology

## 2023-07-09 VITALS — BP 124/78 | HR 71 | Ht 70.0 in | Wt 296.0 lb

## 2023-07-09 DIAGNOSIS — C61 Malignant neoplasm of prostate: Secondary | ICD-10-CM | POA: Diagnosis not present

## 2023-07-09 DIAGNOSIS — R972 Elevated prostate specific antigen [PSA]: Secondary | ICD-10-CM | POA: Diagnosis not present

## 2023-07-09 DIAGNOSIS — Z2989 Encounter for other specified prophylactic measures: Secondary | ICD-10-CM | POA: Diagnosis not present

## 2023-07-09 DIAGNOSIS — N4231 Prostatic intraepithelial neoplasia: Secondary | ICD-10-CM | POA: Diagnosis not present

## 2023-07-09 DIAGNOSIS — Z96611 Presence of right artificial shoulder joint: Secondary | ICD-10-CM | POA: Diagnosis not present

## 2023-07-09 MED ORDER — LEVOFLOXACIN 500 MG PO TABS
500.0000 mg | ORAL_TABLET | Freq: Once | ORAL | Status: AC
  Administered 2023-07-09: 500 mg via ORAL

## 2023-07-09 MED ORDER — GENTAMICIN SULFATE 40 MG/ML IJ SOLN
80.0000 mg | Freq: Once | INTRAMUSCULAR | Status: AC
  Administered 2023-07-09: 80 mg via INTRAMUSCULAR

## 2023-07-09 NOTE — Progress Notes (Signed)
   07/09/23  Indication: Elevated PSA, 11.4  Prostate Biopsy Procedure   Informed consent was obtained, and we discussed the risks of bleeding and infection/sepsis. A time out was performed to ensure correct patient identity.  Pre-Procedure: - Last PSA Level: 11.4 - Gentamicin and levaquin given for antibiotic prophylaxis - Transrectal Ultrasound performed revealing a 40 gm prostate, PSA density 0.29 - No median lobe noted  Procedure: - Prostate block performed using 10 cc 1% lidocaine and biopsies taken from sextant areas, a total of 12 under ultrasound guidance.  Post-Procedure: - Patient tolerated the procedure well - He was counseled to seek immediate medical attention if experiences significant bleeding, fevers, or severe pain - Return in one week to discuss biopsy results  Assessment/ Plan: Will follow up in 1-2 weeks to discuss pathology  Legrand Rams, MD 07/09/2023

## 2023-07-09 NOTE — Patient Instructions (Signed)

## 2023-07-13 DIAGNOSIS — M25511 Pain in right shoulder: Secondary | ICD-10-CM | POA: Diagnosis not present

## 2023-07-13 DIAGNOSIS — Z96611 Presence of right artificial shoulder joint: Secondary | ICD-10-CM | POA: Diagnosis not present

## 2023-07-16 ENCOUNTER — Ambulatory Visit: Admitting: Urology

## 2023-07-16 VITALS — BP 123/74 | Ht 70.0 in

## 2023-07-16 DIAGNOSIS — M25511 Pain in right shoulder: Secondary | ICD-10-CM | POA: Diagnosis not present

## 2023-07-16 DIAGNOSIS — Z96611 Presence of right artificial shoulder joint: Secondary | ICD-10-CM | POA: Diagnosis not present

## 2023-07-16 DIAGNOSIS — C61 Malignant neoplasm of prostate: Secondary | ICD-10-CM

## 2023-07-16 NOTE — Progress Notes (Signed)
   07/16/2023 1:26 PM   DIGBY GROENEVELD September 14, 1955 [id number]  Reason for visit: Discuss prostate biopsy results  HPI: 68 year old male who underwent transrectal ultrasound-guided prostate biopsy on 07/09/2023 for a persistently rising PSA up to 11.4.  This showed a 40 g prostate with a PSA density of 0.29, and 6 out of 12 cores showed prostate adenocarcinoma, all left-sided, and including 2 cores of Gleason score 4+4 = 8, grade group 4 disease.  He denies any urinary symptoms at baseline.  Past surgical history notable for an open appendectomy and open right hernia repair, unclear if with mesh.  He denies any significant urinary symptoms at baseline.  We had a lengthy conversation today about the patient's new diagnosis of prostate cancer.  We reviewed the risk classifications per the AUA guidelines including very low risk, low risk, intermediate risk, and high risk disease, and the need for additional staging imaging with CT and bone scan in patients with unfavorable intermediate risk and high risk disease.  I explained that his life expectancy, clinical stage, Gleason score, PSA, and other co-morbidities influence treatment strategies.  We discussed the roles of active surveillance, radiation therapy, surgical therapy with robotic prostatectomy, and hormone therapy with androgen deprivation.  We discussed that patients urinary symptoms also impact treatment strategy, as patients with severe lower urinary tract symptoms may have significant worsening or even develop urinary retention after undergoing radiation.  In regards to surgery, we discussed robotic prostatectomy +/- lymphadenectomy at length.  The procedure takes 3 to 4 hours, and patient's typically discharge home on post-op day #1.  A Foley catheter is left in place for 7 to 10 days to allow for healing of the vesicourethral anastomosis.  There is a small risk of bleeding, infection, damage to surrounding structures or bowel, hernia, DVT/PE,  or serious cardiac or pulmonary complications.  We discussed at length post-op side effects including erectile dysfunction, and the importance of pre-operative erectile function on long-term outcomes.  Even with a nerve sparing approach, there is an approximately 25% rate of permanent erectile dysfunction.  We also discussed postop urinary incontinence at length.  We expect patients to have stress incontinence post-operatively that will improve over period of weeks to months.  Less than 10% of men will require a pad at 1 year after surgery.  Patients will need to avoid heavy lifting and strenuous activity for 3 to 4 weeks, but most men return to their baseline activity status by 6 weeks.  With his BMI of 42 would be a very challenging robotic prostatectomy candidate secondary to Trendelenburg positioning and ventilation, would not be a candidate here at [facility] for surgery, but [facility] to [facility] or [facility] or [facility] to see if they would consider radical prostatectomy.  I also placed a referral to radiation oncology to discuss radiation treatment options, and PSMA PET scan was ordered for staging.  We again reviewed that localized prostate cancer is treated different than metastatic disease, and if metastatic disease found on PET scan would require referral to medical oncology  Lawerence Pressman, MD  [facility] [address], [address] [place], [place] [address] [phone]

## 2023-07-16 NOTE — Patient Instructions (Signed)
 Prostate Cancer  The prostate is a small gland that produces fluid that makes up semen (seminal fluid). It is located below the bladder in men, in front of the rectum. Prostate cancer is the abnormal growth of cells in the prostate gland. What are the causes? The exact cause of this condition is not known. What increases the risk? You are more likely to develop this condition if: You are 69 years of age or older. You have a family history of prostate cancer. You have a family history of breast and ovarian cancer. You have genes that are passed from parent to child (inherited), such as BRCA1 and BRCA2. You have Lynch syndrome. African American men and men of African descent are diagnosed with prostate cancer at higher rates than other men. The reasons for this are not well understood and are likely due to a combination of genetic and environmental factors. What are the signs or symptoms? Symptoms of this condition include: Problems with urination. This may include: A weak or interrupted flow of urine. Trouble starting or stopping urination. Trouble emptying the bladder all the way. The need to urinate more often, especially at night. Blood in urine or semen. Persistent pain or discomfort in the lower back, lower abdomen, or hips. Trouble getting an erection. Weakness or numbness in the legs or feet. How is this diagnosed? This condition can be diagnosed with: A digital rectal exam. For this exam, a health care provider inserts a gloved finger into the rectum to feel the prostate gland. A blood test called a prostate-specific antigen (PSA) test. A procedure in which a sample of tissue is taken from the prostate and checked under a microscope (prostate biopsy). An imaging test called transrectal ultrasonography. Once the condition is diagnosed, tests will be done to determine how far the cancer has spread. This is called staging the cancer. Staging may involve imaging tests, such as a bone  scan, CT scan, PET scan, or MRI. Stages of prostate cancer The stages of prostate cancer are as follows: Stage 1 (I). At this stage, the cancer is found in the prostate only. The cancer is not visible on imaging tests, and it is usually found by accident, such as during prostate surgery. Stage 2 (II). At this stage, the cancer is more advanced than it is in stage 1, but the cancer has not spread outside the prostate. Stage 3 (III). At this stage, the cancer has spread beyond the outer layer of the prostate to nearby tissues. The cancer may be found in the seminal vesicles, which are near the bladder and the prostate. Stage 4 (IV). At this stage, the cancer has spread to other parts of the body, such as the lymph nodes, bones, bladder, rectum, liver, or lungs. Prostate cancer grading Prostate cancer is also graded according to how the cancer cells look under a microscope. This is called the Gleason score and the total score can range from 6-10, indicating how likely it is that the cancer will spread (metastasize) to other parts of the body. The higher the score, the greater the likelihood that the cancer will spread. Gleason 6 or lower: This indicates that the cancer cells look similar to normal prostate cells (well differentiated). Gleason 7: This indicates that the cancer cells look somewhat similar to normal prostate cells (moderately differentiated). Gleason 8, 9, or 10: This indicates that the cancer cells look very different than normal prostate cells (poorly differentiated). How is this treated? Treatment for this condition depends on several  factors, including the stage of the cancer, your age, personal preferences, and your overall health. Talk with your health care provider about treatment options that are recommended for you. Common treatments include: Observation for early stage prostate cancer (active surveillance). This involves having exams, blood tests, and in some cases, more biopsies.  For some men, this is the only treatment needed. Surgery. Types of surgeries include:  A laparoscopic radical prostatectomy. This is a surgery to remove the prostate and lymph nodes through several small incisions. It is often referred to as a minimally invasive surgery. A robotic radical prostatectomy. This is laparoscopic surgery to remove the prostate and lymph nodes with the help of robotic arms that are controlled by the surgeon.  Radiation treatment. Types of radiation treatment include: External beam radiation. This type aims beams of radiation from outside the body at the prostate to destroy cancerous cells. Brachytherapy. This type uses radioactive needles, seeds, wires, or tubes that are implanted into the prostate gland. Like external beam radiation, brachytherapy destroys cancerous cells. An advantage is that this type of radiation limits the damage to surrounding tissue and has fewer side effects. Chemotherapy. This treatment kills cancer cells or stops them from multiplying. It kills both cancer cells and normal cells. Targeted therapy. This treatment uses medicines to kill cancer cells without damaging normal cells. Hormone treatment. This treatment involves taking medicines that act on testosterone, one of the male hormones, by: Stopping your body from producing testosterone. Blocking testosterone from reaching cancer cells. Follow these instructions at home: Lifestyle Do not use any products that contain nicotine or tobacco. These products include cigarettes, chewing tobacco, and vaping devices, such as e-cigarettes. If you need help quitting, ask your health care provider. Eat a healthy diet. To do this: Eat foods that are high in fiber. These include beans, whole grains, and fresh fruits and vegetables. Limit foods that are high in fat and sugar. These include fried or sweet foods. Treatment for prostate cancer may affect sexual function. If you have a partner, continue to have  intimate moments. This may include touching, holding, hugging, and caressing your partner. Get plenty of sleep. Consider joining a support group for men who have prostate cancer. Meeting with a support group may help you learn to manage the stress of having cancer. General instructions Take over-the-counter and prescription medicines only as told by your health care provider. If you have to go to the hospital, notify your cancer specialist (oncologist). Keep all follow-up visits. This is important. Where to find more information American Cancer Society: www.cancer.org American Society of Clinical Oncology: www.cancer.net [employer]: www.cancer.gov Contact a health care provider if: You have new or increasing trouble urinating. You have new or increasing blood in your urine. You have new or increasing pain in your hips, back, or chest. Get help right away if: You have weakness or numbness in your legs. You cannot control urination or your bowel movements (incontinence). You have chills or a fever. Summary The prostate is a small gland that is involved in the production of semen. It is located below a man's bladder, in front of the rectum. Prostate cancer is the abnormal growth of cells in the prostate gland. Treatment for this condition depends on the stage of the cancer, your age, personal preferences, and your overall health. Talk with your health care provider about treatment options that are recommended for you. Consider joining a support group for men who have prostate cancer. Meeting with a support group may  help you learn to manage the stress of having cancer. This information is not intended to replace advice given to you by your health care provider. Make sure you discuss any questions you have with your health care provider. Document Revised: 06/13/2020 Document Reviewed: 06/13/2020 Elsevier Patient Education  2024 [employer].

## 2023-07-20 DIAGNOSIS — M25511 Pain in right shoulder: Secondary | ICD-10-CM | POA: Diagnosis not present

## 2023-07-20 DIAGNOSIS — Z96611 Presence of right artificial shoulder joint: Secondary | ICD-10-CM | POA: Diagnosis not present

## 2023-07-23 DIAGNOSIS — Z96611 Presence of right artificial shoulder joint: Secondary | ICD-10-CM | POA: Diagnosis not present

## 2023-07-23 DIAGNOSIS — M25511 Pain in right shoulder: Secondary | ICD-10-CM | POA: Diagnosis not present

## 2023-07-27 DIAGNOSIS — Z96611 Presence of right artificial shoulder joint: Secondary | ICD-10-CM | POA: Diagnosis not present

## 2023-07-27 DIAGNOSIS — M25511 Pain in right shoulder: Secondary | ICD-10-CM | POA: Diagnosis not present

## 2023-07-28 ENCOUNTER — Ambulatory Visit
Admission: RE | Admit: 2023-07-28 | Discharge: 2023-07-28 | Disposition: A | Discharge status: Home / Self Care | Source: Ambulatory Visit | Attending: Urology | Admitting: Urology

## 2023-07-28 DIAGNOSIS — C61 Malignant neoplasm of prostate: Secondary | ICD-10-CM | POA: Diagnosis not present

## 2023-07-28 DIAGNOSIS — I700 Atherosclerosis of aorta: Secondary | ICD-10-CM | POA: Diagnosis not present

## 2023-07-28 MED ORDER — FLOTUFOLASTAT F 18 GALLIUM 296-5846 MBQ/ML IV SOLN
8.6700 | Freq: Once | INTRAVENOUS | Status: AC
  Administered 2023-07-28: 8.67 via INTRAVENOUS
  Filled 2023-07-28: qty 9

## 2023-07-30 DIAGNOSIS — Z96611 Presence of right artificial shoulder joint: Secondary | ICD-10-CM | POA: Diagnosis not present

## 2023-07-30 DIAGNOSIS — M25511 Pain in right shoulder: Secondary | ICD-10-CM | POA: Diagnosis not present

## 2023-08-03 ENCOUNTER — Ambulatory Visit
Admission: RE | Admit: 2023-08-03 | Discharge: 2023-08-03 | Disposition: A | Discharge status: Home / Self Care | Source: Ambulatory Visit | Attending: Radiation Oncology | Admitting: Radiation Oncology

## 2023-08-03 ENCOUNTER — Other Ambulatory Visit: Payer: Self-pay | Admitting: Internal Medicine

## 2023-08-03 ENCOUNTER — Encounter: Payer: Self-pay | Admitting: Radiation Oncology

## 2023-08-03 VITALS — BP 127/77 | HR 64 | Temp 98.5°F | Resp 16 | Wt 301.0 lb

## 2023-08-03 DIAGNOSIS — G4733 Obstructive sleep apnea (adult) (pediatric): Secondary | ICD-10-CM | POA: Diagnosis not present

## 2023-08-03 DIAGNOSIS — Z510 Encounter for antineoplastic radiation therapy: Secondary | ICD-10-CM | POA: Diagnosis not present

## 2023-08-03 DIAGNOSIS — M129 Arthropathy, unspecified: Secondary | ICD-10-CM | POA: Insufficient documentation

## 2023-08-03 DIAGNOSIS — Z7952 Long term (current) use of systemic steroids: Secondary | ICD-10-CM | POA: Diagnosis not present

## 2023-08-03 DIAGNOSIS — M4310 Spondylolisthesis, site unspecified: Secondary | ICD-10-CM | POA: Insufficient documentation

## 2023-08-03 DIAGNOSIS — I10 Essential (primary) hypertension: Secondary | ICD-10-CM | POA: Diagnosis not present

## 2023-08-03 DIAGNOSIS — Z6841 Body Mass Index (BMI) 40.0 and over, adult: Secondary | ICD-10-CM | POA: Diagnosis not present

## 2023-08-03 DIAGNOSIS — E785 Hyperlipidemia, unspecified: Secondary | ICD-10-CM | POA: Insufficient documentation

## 2023-08-03 DIAGNOSIS — Z191 Hormone sensitive malignancy status: Secondary | ICD-10-CM | POA: Diagnosis not present

## 2023-08-03 DIAGNOSIS — Z96611 Presence of right artificial shoulder joint: Secondary | ICD-10-CM | POA: Diagnosis not present

## 2023-08-03 DIAGNOSIS — I712 Thoracic aortic aneurysm, without rupture, unspecified: Secondary | ICD-10-CM | POA: Diagnosis not present

## 2023-08-03 DIAGNOSIS — M109 Gout, unspecified: Secondary | ICD-10-CM | POA: Insufficient documentation

## 2023-08-03 DIAGNOSIS — K219 Gastro-esophageal reflux disease without esophagitis: Secondary | ICD-10-CM | POA: Insufficient documentation

## 2023-08-03 DIAGNOSIS — Z85828 Personal history of other malignant neoplasm of skin: Secondary | ICD-10-CM | POA: Insufficient documentation

## 2023-08-03 DIAGNOSIS — G629 Polyneuropathy, unspecified: Secondary | ICD-10-CM | POA: Insufficient documentation

## 2023-08-03 DIAGNOSIS — Z79899 Other long term (current) drug therapy: Secondary | ICD-10-CM | POA: Diagnosis not present

## 2023-08-03 DIAGNOSIS — G4730 Sleep apnea, unspecified: Secondary | ICD-10-CM | POA: Insufficient documentation

## 2023-08-03 DIAGNOSIS — L570 Actinic keratosis: Secondary | ICD-10-CM | POA: Diagnosis not present

## 2023-08-03 DIAGNOSIS — I7121 Aneurysm of the ascending aorta, without rupture: Secondary | ICD-10-CM | POA: Diagnosis not present

## 2023-08-03 DIAGNOSIS — M25511 Pain in right shoulder: Secondary | ICD-10-CM | POA: Diagnosis not present

## 2023-08-03 DIAGNOSIS — C61 Malignant neoplasm of prostate: Secondary | ICD-10-CM | POA: Diagnosis not present

## 2023-08-03 NOTE — Consult Note (Signed)
 NEW PATIENT EVALUATION  Name: Rodney Clark  MRN: [id number]  Date:   08/03/2023     DOB: Nov 07, 1955   This 68 y.o. male patient presents to the clinic for initial evaluation of stage IIc (cT1 cN0 M0) Gleason 8 (4+4) adenocarcinoma the prostate presenting with a PSA of 11.4.  REFERRING PHYSICIAN: Lawerence Pressman, MD  CHIEF COMPLAINT:  Chief Complaint  Patient presents with   Prostate Cancer    DIAGNOSIS: The encounter diagnosis was Malignant neoplasm of prostate (HCC).   PREVIOUS INVESTIGATIONS:  PSMA PET PET scan reviewed Pathology reports reviewed Clinical notes reviewed  HPI: Patient is a 68 year old male with morbid obesity who has had a slowly rising PSA most recently up to 11.4.  This prompted transrectal ultrasound-guided biopsy showing 6 of 12 cores positive for adenocarcinoma 2 cores were Gleason 8 (4+4).  Other cores were combination of Gleason 7 (3+4) as well as Gleason 6 (3+3).  Patient has had a PSMA PET scan performed showing intense activity in the right lobe of the prostate gland consistent with primary prostate adenocarcinoma no evidence of metastatic adenopathy in the pelvis or periaortic retroperitoneum.  There is also no evidence of visceral or skeletal metastasis.  Patient is fairly asymptomatic has no significant increase in lower urinary tract symptoms.  Bowel function is good does have arthritic type pain nothing focal.  On recommendation of urology he would be a difficult surgical candidate based on his BMI.  He is now referred to radiation oncology for consideration of treatment.  PLANNED TREATMENT REGIMEN: IMRT radiation therapy plus ADT therapy  PAST MEDICAL HISTORY:  has a past medical history of Actinic keratosis, Allergic state, Allergy, Aneurysm of ascending aorta without rupture (HCC), Arthritis, Atypical chest pain, Cholelithiasis (09/19/2021), Elevated PSA, Essential hypertension, GERD (gastroesophageal reflux disease), Gout, Hyperglycemia,  Hyperlipidemia, Neuromuscular disorder (HCC), Numbness of toes, Polyradiculitis (09/19/2021), Pre-diabetes, Sacroiliitis (HCC), Sleep apnea, Spondylolisthesis at L5-S1 level (01/05/2018), Squamous cell carcinoma of skin (11/16/2018), Squamous cell carcinoma of skin, Stenosis of cervical spine with myelopathy (HCC), and Thoracic aortic aneurysm (HCC).    PAST SURGICAL HISTORY:  Past Surgical History:  Procedure Laterality Date   ANTERIOR CERVICAL DECOMP/DISCECTOMY FUSION N/A 12/24/2021   Procedure: Anterior Cervical Decompression Fusion  Cervical five-Cervical six - Cervical six-Cervical seven;  Surgeon: Agustina Aldrich, MD;  Location: [facility] OR;  Service: Neurosurgery;  Laterality: N/A;   APPENDECTOMY     CATARACT EXTRACTION W/ INTRAOCULAR LENS IMPLANT Left 06/15/2019   CATARACT EXTRACTION W/ INTRAOCULAR LENS IMPLANT Right 07/06/2019   COLONOSCOPY N/A 05/14/2015   Procedure: COLONOSCOPY;  Surgeon: Cassie Click, MD;  Location: [facility];  Service: Endoscopy;  Laterality: N/A;   COLONOSCOPY     INGUINAL HERNIA REPAIR Right    x2   KNEE ARTHROSCOPY Right 07/30/2022   Procedure: RIGHT KNEE ARTHROSCOPY WITH DEBRIDEMENT AND EXCISION OF SYMPTOMATIC BIPARTITE FRAGMENT.;  Surgeon: Elner Hahn, MD;  Location: [facility] ORS;  Service: Orthopedics;  Laterality: Right;   POSTERIOR LAMINECTOMY / DECOMPRESSION LUMBAR SPINE  01/05/2018   L5-S1   REVERSE SHOULDER ARTHROPLASTY Right 05/20/2023   Procedure: REVERSE SHOULDER ARTHROPLASTY WITH BICEPS TENODESIS - RNFA;  Surgeon: Elner Hahn, MD;  Location: [facility] ORS;  Service: Orthopedics;  Laterality: Right;   ROTATOR CUFF REPAIR Right    TONSILLECTOMY      FAMILY HISTORY: family history includes Hypertension in his mother.  SOCIAL HISTORY:  reports that he has never smoked. He has never used smokeless tobacco. He reports that he does not  currently use alcohol  after a past usage of about 3.0 standard drinks of alcohol  per week. He reports that he does not  currently use drugs.  ALLERGIES: Crestor [rosuvastatin] and Lyrica [pregabalin]  MEDICATIONS:  Current Outpatient Medications  Medication Sig Dispense Refill   acetaminophen  (TYLENOL ) 500 MG tablet Take 1,000 mg by mouth every 6 (six) hours as needed for moderate pain (pain score 4-6).     famotidine  (PEPCID ) 20 MG tablet Take 20 mg by mouth daily.      fenofibrate  160 MG tablet Take 160 mg by mouth daily.     fexofenadine (ALLEGRA) 180 MG tablet Take 180 mg by mouth daily.     fluorouracil  (EFUDEX ) 5 % cream Apply 1 application  topically as needed.     gabapentin  (NEURONTIN ) 300 MG capsule Take 1 capsule (300 mg total) by mouth 5 (five) times daily.     hydrochlorothiazide  (HYDRODIURIL ) 25 MG tablet Take 1 tablet by mouth daily.     ketoconazole  (NIZORAL ) 2 % shampoo Apply 1 Application topically 2 (two) times a week. massage into scalp and entire face and leave in for 5 -10 minutes before rinsing out     losartan  (COZAAR ) 50 MG tablet Take 50 mg by mouth daily.     melatonin 5 MG TABS Take 10 mg by mouth at bedtime.     pravastatin  (PRAVACHOL ) 10 MG tablet Take 10 mg by mouth daily.     predniSONE  (DELTASONE ) 10 MG tablet Take 4 tabs daily for 3 days, then 3 tabs daily x 3 days, then 2 tabs daily for 3 days, then 1 tab daily x 3 days.     tiZANidine  (ZANAFLEX ) 4 MG tablet Take 4 mg by mouth 3 (three) times daily as needed for muscle spasms.  4   traZODone (DESYREL) 50 MG tablet Take 50 mg by mouth at bedtime as needed.     No current facility-administered medications for this encounter.    ECOG PERFORMANCE STATUS:  0 - Asymptomatic  REVIEW OF SYSTEMS: Patient has multiple medical comorbidities including aneurysm of send aortic without rupture arthritis cholelithiasis essential hypertension GERD prediabetes stenosis of the cervical spine Patient denies any weight loss, fatigue, weakness, fever, chills or night sweats. Patient denies any loss of vision, blurred vision. Patient denies any  ringing  of the ears or hearing loss. No irregular heartbeat. Patient denies heart murmur or history of fainting. Patient denies any chest pain or pain radiating to her upper extremities. Patient denies any shortness of breath, difficulty breathing at night, cough or hemoptysis. Patient denies any swelling in the lower legs. Patient denies any nausea vomiting, vomiting of blood, or coffee ground material in the vomitus. Patient denies any stomach pain. Patient states has had normal bowel movements no significant constipation or diarrhea. Patient denies any dysuria, hematuria or significant nocturia. Patient denies any problems walking, swelling in the joints or loss of balance. Patient denies any skin changes, loss of hair or loss of weight. Patient denies any excessive worrying or anxiety or significant depression. Patient denies any problems with insomnia. Patient denies excessive thirst, polyuria, polydipsia. Patient denies any swollen glands, patient denies easy bruising or easy bleeding. Patient denies any recent infections, allergies or URI. Patient "s visual fields have not changed significantly in recent time.   PHYSICAL EXAM: BP 127/77   Pulse 64   Temp 98.5 F (36.9 C) (Tympanic)   Resp 16   Wt (!) 301 lb (136.5 kg)   BMI 43.19 kg/m  Obese male in NAD.  Well-developed well-nourished patient in NAD. HEENT reveals PERLA, EOMI, discs not visualized.  Oral cavity is clear. No oral mucosal lesions are identified. Neck is clear without evidence of cervical or supraclavicular adenopathy. Lungs are clear to A&P. Cardiac examination is essentially unremarkable with regular rate and rhythm without murmur rub or thrill. Abdomen is benign with no organomegaly or masses noted. Motor sensory and DTR levels are equal and symmetric in the upper and lower extremities. Cranial nerves II through XII are grossly intact. Proprioception is intact. No peripheral adenopathy or edema is identified. No motor or sensory  levels are noted. Crude visual fields are within normal range.  LABORATORY DATA: Pathology reports reviewed    RADIOLOGY RESULTS: PSMA PET scan reviewed compatible with above-stated findings   IMPRESSION: Stage IIc Gleason 8 (4+4) adenocarcinoma the prostate presenting with a PSA in 11 range in 68 year old male  PLAN: At this time I have recommended image guided IMRT radiation therapy.  Will treat his prostate and pelvic nodes based on the [facility] nomogram showing a 21% chance of lymph node involvement as well as a 70% chance of extracapsular extension.  Risks and benefits of treatment were reviewed with the patient.  Side effect such as increased lower Neri tract symptoms diarrhea fatigue alteration blood counts skin reaction all were reviewed with the patient.  I have asked urology to place fiducial markers for daily image guided treatment.  I have also asked them to start him on ADT therapy which he will probably require for at least 2 years.  Patient wife both comprehend my treatment plan well we will set up simulation once markers are placed.  I would like to take this opportunity to thank you for allowing me to participate in the care of your patient.Glenis Langdon, MD

## 2023-08-04 ENCOUNTER — Telehealth: Payer: Self-pay

## 2023-08-04 NOTE — Telephone Encounter (Signed)
 No PA required for Eligard unless change in insurance from [employer].

## 2023-08-04 NOTE — Telephone Encounter (Signed)
-----   Message from Nurse Charissa Compton sent at 08/03/2023  2:27 PM EDT ----- Patient needs an appointment for Eligard and marker placement. Thanks [employer]

## 2023-08-06 DIAGNOSIS — Z96611 Presence of right artificial shoulder joint: Secondary | ICD-10-CM | POA: Diagnosis not present

## 2023-08-06 DIAGNOSIS — M25511 Pain in right shoulder: Secondary | ICD-10-CM | POA: Diagnosis not present

## 2023-08-10 ENCOUNTER — Encounter: Payer: Self-pay | Admitting: Dermatology

## 2023-08-10 ENCOUNTER — Ambulatory Visit: Admitting: Dermatology

## 2023-08-10 DIAGNOSIS — Z96611 Presence of right artificial shoulder joint: Secondary | ICD-10-CM | POA: Diagnosis not present

## 2023-08-10 DIAGNOSIS — L0291 Cutaneous abscess, unspecified: Secondary | ICD-10-CM | POA: Diagnosis not present

## 2023-08-10 DIAGNOSIS — M25511 Pain in right shoulder: Secondary | ICD-10-CM | POA: Diagnosis not present

## 2023-08-10 DIAGNOSIS — L0211 Cutaneous abscess of neck: Secondary | ICD-10-CM

## 2023-08-10 MED ORDER — DOXYCYCLINE HYCLATE 100 MG PO TABS
100.0000 mg | ORAL_TABLET | Freq: Two times a day (BID) | ORAL | 0 refills | Status: DC
Start: 2023-08-10 — End: 2023-08-17

## 2023-08-10 NOTE — Progress Notes (Signed)
   Follow-Up Visit   Subjective  Rodney Clark is a 68 y.o. male who presents for the following: Patient had an ingrown hair at left anterior neck pulled out Friday night by his wife, patient reports she tweezed it out and by mid-day Saturday started getting red and inflamed. Patient denies any draining, but area is warm to the touch, pain associated, denies any fevers.   The patient has spots, moles and lesions to be evaluated, some may be new or changing and the patient may have concern these could be cancer.   The following portions of the chart were reviewed this encounter and updated as appropriate: medications, allergies, medical history  Review of Systems:  No other skin or systemic complaints except as noted in HPI or Assessment and Plan.  Objective  Well appearing patient in no apparent distress; mood and affect are within normal limits.  A focused examination was performed of the following areas: L Neck  Relevant exam findings are noted in the Assessment and Plan.      Assessment & Plan   ABSCESS Left anterior neck Go to ED if fever chills malaise body aches Incision and Drainage - Left anterior neck Location: Left anterior neck  Informed Consent: Discussed risks (permanent scarring, light or dark discoloration, infection, pain, bleeding, bruising, redness, damage to adjacent structures, and recurrence of the lesion) and benefits of the procedure, as well as the alternatives.  Informed consent was obtained.  Preparation: The area was prepped with alcohol .  Anesthesia: Lidocaine  1% with epinephrine   Procedure Details: An incision was made overlying the lesion with 11 blade and then with 2 mm punch. The lesion drained pus and blood.  A small amount of fluid was drained.    Antibiotic ointment and a sterile pressure dressing were applied. The patient tolerated procedure well.  Total number of lesions drained: 1  Plan: Aerobic culture collected   Start Doxycyline  100mg  PO BID for 7 days, take with food.   Doxycycline  should be taken with food to prevent nausea. Do not lay down for 30 minutes after taking. Be cautious with sun exposure and use good sun protection while on this medication. Pregnant women should not take this medication.   Patient advised if he develops and fever, chills to proceed to emergency room.   The patient was instructed on post-op care.  Related Procedures Anaerobic and Aerobic Culture Related Medications doxycycline  (VIBRA -TABS) 100 MG tablet Take 1 tablet (100 mg total) by mouth 2 (two) times daily for 7 days.  Return in about 3 days (around 08/13/2023) for w/ Dr. Felipe Horton for abscess at L neck.  I, Jacquelynn Vera, CMA, am acting as scribe for Harris Liming, MD .    Documentation: I have reviewed the above documentation for accuracy and completeness, and I agree with the above.  Harris Liming, MD

## 2023-08-10 NOTE — Patient Instructions (Signed)

## 2023-08-12 DIAGNOSIS — Z191 Hormone sensitive malignancy status: Secondary | ICD-10-CM | POA: Diagnosis not present

## 2023-08-12 DIAGNOSIS — C61 Malignant neoplasm of prostate: Secondary | ICD-10-CM | POA: Diagnosis not present

## 2023-08-13 ENCOUNTER — Encounter: Payer: Self-pay | Admitting: Dermatology

## 2023-08-13 ENCOUNTER — Ambulatory Visit: Admitting: Dermatology

## 2023-08-13 DIAGNOSIS — L0211 Cutaneous abscess of neck: Secondary | ICD-10-CM | POA: Diagnosis not present

## 2023-08-13 DIAGNOSIS — L0291 Cutaneous abscess, unspecified: Secondary | ICD-10-CM

## 2023-08-13 NOTE — Patient Instructions (Signed)

## 2023-08-13 NOTE — Progress Notes (Signed)
   Follow-Up Visit   Subjective  Rodney Clark is a 68 y.o. male who presents for the following: here for recheck of abscess at left neck, reports since incision and drainage 3 days ago area feels has improved. Has still continued to the drainage a little. Is continuing to take doxycycline  100 mg bid for 7 days.  Still awaiting culture results.   The patient has spots, moles and lesions to be evaluated, some may be new or changing and the patient may have concern these could be cancer.   The following portions of the chart were reviewed this encounter and updated as appropriate: medications, allergies, medical history  Review of Systems:  No other skin or systemic complaints except as noted in HPI or Assessment and Plan.  Objective  Well appearing patient in no apparent distress; mood and affect are within normal limits.    A focused examination was performed of the following areas: Left neck   Relevant exam findings are noted in the Assessment and Plan.    Assessment & Plan   ABSCESS  Exam: improved erythematous edematous inflamed plaque on L neck with continued purulent drainage and new ulceration with purulence  Treatment Plan:  Hx of incision and drainage 3 days ago, patient given 1 week course of doxycycline  100 mg to take twice daily.  Recommend to continue medication  Location: left neck   Informed Consent: Discussed risks (permanent scarring, light or dark discoloration, infection, pain, bleeding, bruising, redness, damage to adjacent structures, and recurrence of the lesion) and benefits of the procedure, as well as the alternatives.  Informed consent was obtained.  Preparation: The area was prepped with alcohol .  Anesthesia: Lidocaine  1% with epinephrine   Procedure Details: An incision was made overlying the lesion with 11 blade. The lesion drained pus and blood.  A small amount of fluid was drained.    Antibiotic ointment and a sterile pressure dressing were  applied. The patient tolerated procedure well.  Total number of lesions drained: 1  Plan: The patient was instructed on post-op care. Recommend OTC analgesia as needed for pain.     ABSCESS   Related Medications doxycycline  (VIBRA -TABS) 100 MG tablet Take 1 tablet (100 mg total) by mouth 2 (two) times daily for 7 days.  Return for follow up on abscess at left neck.  I, Randee Busing, CMA, am acting as scribe for Harris Liming, MD.   Documentation: I have reviewed the above documentation for accuracy and completeness, and I agree with the above.  Harris Liming, MD

## 2023-08-14 DIAGNOSIS — Z96611 Presence of right artificial shoulder joint: Secondary | ICD-10-CM | POA: Diagnosis not present

## 2023-08-14 DIAGNOSIS — M25511 Pain in right shoulder: Secondary | ICD-10-CM | POA: Diagnosis not present

## 2023-08-14 LAB — ANAEROBIC AND AEROBIC CULTURE

## 2023-08-14 LAB — SPECIMEN STATUS REPORT

## 2023-08-16 ENCOUNTER — Ambulatory Visit: Payer: Self-pay | Admitting: Dermatology

## 2023-08-17 ENCOUNTER — Other Ambulatory Visit: Payer: Self-pay | Admitting: Dermatology

## 2023-08-17 DIAGNOSIS — M25511 Pain in right shoulder: Secondary | ICD-10-CM | POA: Diagnosis not present

## 2023-08-17 DIAGNOSIS — L0291 Cutaneous abscess, unspecified: Secondary | ICD-10-CM

## 2023-08-17 DIAGNOSIS — Z96611 Presence of right artificial shoulder joint: Secondary | ICD-10-CM | POA: Diagnosis not present

## 2023-08-17 MED ORDER — DOXYCYCLINE HYCLATE 100 MG PO TABS: 100.0000 mg | ORAL_TABLET | Freq: Two times a day (BID) | ORAL | 0 refills | Status: AC

## 2023-08-18 ENCOUNTER — Ambulatory Visit: Admitting: Urology

## 2023-08-20 ENCOUNTER — Encounter: Payer: Self-pay | Admitting: Dermatology

## 2023-08-20 ENCOUNTER — Encounter: Payer: Self-pay | Admitting: Urology

## 2023-08-20 ENCOUNTER — Ambulatory Visit: Admitting: Dermatology

## 2023-08-20 ENCOUNTER — Ambulatory Visit: Admitting: Urology

## 2023-08-20 VITALS — BP 135/82 | HR 69 | Ht 70.0 in | Wt 294.0 lb

## 2023-08-20 DIAGNOSIS — L0291 Cutaneous abscess, unspecified: Secondary | ICD-10-CM

## 2023-08-20 DIAGNOSIS — C61 Malignant neoplasm of prostate: Secondary | ICD-10-CM | POA: Diagnosis not present

## 2023-08-20 DIAGNOSIS — Z2989 Encounter for other specified prophylactic measures: Secondary | ICD-10-CM | POA: Diagnosis not present

## 2023-08-20 DIAGNOSIS — L0211 Cutaneous abscess of neck: Secondary | ICD-10-CM

## 2023-08-20 MED ORDER — LEVOFLOXACIN 500 MG PO TABS
500.0000 mg | ORAL_TABLET | Freq: Once | ORAL | Status: AC
  Administered 2023-08-20: 500 mg via ORAL

## 2023-08-20 MED ORDER — LEUPROLIDE ACETATE (6 MONTH) 45 MG ~~LOC~~ KIT
45.0000 mg | PACK | Freq: Once | SUBCUTANEOUS | Status: AC
  Administered 2023-08-20: 45 mg via SUBCUTANEOUS

## 2023-08-20 MED ORDER — GENTAMICIN SULFATE 40 MG/ML IJ SOLN
80.0000 mg | Freq: Once | INTRAMUSCULAR | Status: AC
Start: 2023-08-20 — End: 2023-08-20
  Administered 2023-08-20: 80 mg via INTRAMUSCULAR

## 2023-08-20 NOTE — Progress Notes (Signed)
 Eligard SubQ Injection   Due to Prostate Cancer patient is present today for a Eligard Injection.  Medication: Eligard 6 month Dose: 45 mg  Location: left upper outer quadrant Lot: [id number] Exp: 11/2024  Patient tolerated well, no complications were noted  Performed by: Tanaka Gillen H RMA  Per Dr. Cherylene Corrente patient is to receive 1 time dose. continue on Vitamin D 800-1000iu and Calcium 1000-1200mg  daily while on Androgen Deprivation Therapy.  PA approval dates: No PA required

## 2023-08-20 NOTE — Patient Instructions (Signed)
 Finish Doxycycline  as directed    Due to recent changes in healthcare laws, you may see results of your pathology and/or laboratory studies on MyChart before the doctors have had a chance to review them. We understand that in some cases there may be results that are confusing or concerning to you. Please understand that not all results are received at the same time and often the doctors may need to interpret multiple results in order to provide you with the best plan of care or course of treatment. Therefore, we ask that you please give us  2 business days to thoroughly review all your results before contacting the office for clarification. Should we see a critical lab result, you will be contacted sooner.   If You Need Anything After Your Visit  If you have any questions or concerns for your doctor, please call our main line at [phone] and press option 4 to reach your doctor's medical assistant. If no one answers, please leave a voicemail as directed and we will return your call as soon as possible. Messages left after 4 pm will be answered the following business day.   You may also send us  a message via MyChart. We typically respond to MyChart messages within 1-2 business days.  For prescription refills, please ask your pharmacy to contact our office. Our fax number is [phone].  If you have an urgent issue when the clinic is closed that cannot wait until the next business day, you can page your doctor at the number below.    Please note that while we do our best to be available for urgent issues outside of office hours, we are not available 24/7.   If you have an urgent issue and are unable to reach us , you may choose to seek medical care at your doctor's office, retail clinic, urgent care center, or emergency room.  If you have a medical emergency, please immediately call 911 or go to the emergency department.  Pager Numbers  - Dr. Bary Likes: [phone]  - Dr. Annette Barters:  [phone]  - Dr. Felipe Horton: [phone]   In the event of inclement weather, please call our main line at [phone] for an update on the status of any delays or closures.  Dermatology Medication Tips: Please keep the boxes that topical medications come in in order to help keep track of the instructions about where and how to use these. Pharmacies typically print the medication instructions only on the boxes and not directly on the medication tubes.   If your medication is too expensive, please contact our office at [phone] option 4 or send us  a message through MyChart.   We are unable to tell what your co-pay for medications will be in advance as this is different depending on your insurance coverage. However, we may be able to find a substitute medication at lower cost or fill out paperwork to get insurance to cover a needed medication.   If a prior authorization is required to get your medication covered by your insurance company, please allow us  1-2 business days to complete this process.  Drug prices often vary depending on where the prescription is filled and some pharmacies may offer cheaper prices.  The website www.goodrx.com contains coupons for medications through different pharmacies. The prices here do not account for what the cost may be with help from insurance (it may be cheaper with your insurance), but the website can give you the [place] if you did not use any insurance.  - You can  print the associated coupon and take it with your prescription to the pharmacy.  - You may also stop by our office during regular business hours and pick up a GoodRx coupon card.  - If you need your prescription sent electronically to a different pharmacy, notify our office through [facility] or by phone at [phone] option 4.     Si Usted Necesita Algo Despus de Su Visita  Tambin puede enviarnos un mensaje a travs de Clinical cytogeneticist. Por lo general respondemos a los mensajes de  MyChart en el transcurso de 1 a 2 das hbiles.  Para renovar recetas, por favor pida a su farmacia que se ponga en contacto con nuestra oficina. Franz Jacks de fax es [place] [phone].  Si tiene un asunto urgente cuando la clnica est cerrada y que no puede esperar hasta el siguiente da hbil, puede llamar/localizar a su doctor(a) al nmero que aparece a continuacin.   Por favor, tenga en cuenta que aunque hacemos todo lo posible para estar disponibles para asuntos urgentes fuera del horario de [place], no estamos disponibles las 24 horas del da, los 7 [address]  [address] de la [place].   Si tiene un problema urgente y no puede comunicarse con nosotros, puede optar por buscar atencin mdica  en el consultorio de su doctor(a), en una clnica privada, en un centro de atencin urgente o en una sala de emergencias.  Si tiene Engineer, drilling, por favor llame inmediatamente al 911 o vaya a la sala de emergencias.  Nmeros de bper  - Dr. Bary Likes: [phone]  - Dra. Annette Barters: [phone]  - Dr. Felipe Horton: [phone]   En caso de inclemencias del tiempo, por favor llame a Lajuan Pila principal al [phone] para una actualizacin sobre el [place] de cualquier retraso o cierre.  Consejos para la medicacin en dermatologa: Por favor, guarde las cajas en las que vienen los medicamentos de uso tpico para ayudarle a seguir las instrucciones sobre dnde y cmo usarlos. Las farmacias generalmente imprimen las instrucciones del medicamento slo en las cajas y no directamente en los tubos del [place].   Si su medicamento es muy caro, por favor, pngase en contacto con Bettyjane Brunet llamando al [phone] y presione la opcin 4 o envenos un mensaje a travs de Clinical cytogeneticist.   No podemos decirle cul ser su copago por los medicamentos por adelantado ya que esto es diferente dependiendo de la cobertura de su seguro. Sin embargo, es posible que podamos encontrar un medicamento sustituto a Advice worker un formulario para que el seguro cubra el medicamento que se considera necesario.   Si se requiere una autorizacin previa para que su compaa de seguros [place] su medicamento, por favor permtanos de 1 a 2 das hbiles para completar este proceso.  Los precios de los medicamentos varan con frecuencia dependiendo del Environmental consultant de dnde se surte la receta y alguna farmacias pueden ofrecer precios ms baratos.  El sitio web www.goodrx.com tiene cupones para medicamentos de Health and safety inspector. Los precios aqu no tienen en cuenta lo que podra costar con la ayuda del seguro (puede ser ms barato con su seguro), pero el sitio web puede darle el precio si no utiliz Tourist information centre manager.  - Puede imprimir el cupn correspondiente y llevarlo con su receta a la farmacia.  - Tambin puede pasar por nuestra oficina durante el horario de atencin regular y Education officer, museum una tarjeta de cupones de GoodRx.  - Si necesita que su receta se enve electrnicamente a una farmacia diferente, informe a  nuestra oficina a travs de MyChart de [place] o por telfono llamando al [phone] y presione la opcin 4.

## 2023-08-20 NOTE — Progress Notes (Signed)
   Follow-Up Visit   Subjective  Rodney Clark is a 68 y.o. male who presents for the following: 1 week follow up. Abscess at left neck. Tx with I&D 08/10/2023. Finished Doxycycline  as directed, refilled and is on day 3 of 2nd round of Doxy. Patient reports improvement. No longer tender.     The following portions of the chart were reviewed this encounter and updated as appropriate: medications, allergies, medical history  Review of Systems:  No other skin or systemic complaints except as noted in HPI or Assessment and Plan.  Objective  Well appearing patient in no apparent distress; mood and affect are within normal limits.  A focused examination was performed of the following areas: Neck  Relevant physical exam findings are noted in the Assessment and Plan.         Assessment & Plan   MRSA Abscess  Exam:  improved erythematous edematous nodule at left neck. Surrounding yellowing indicates hemoglobin breakdown to bilirubin  Hx of I&D 08/10/2023 and 08/13/2023. 08/10/23 culture with MRSA  Treatment: Clinically improving but has persistent erythema and induration from edema continue Doxycycline  100 mg BID as directed.  If not improved at follow up will perform I&D.   ABSCESS   Related Medications doxycycline  (VIBRA -TABS) 100 MG tablet Take 1 tablet (100 mg total) by mouth 2 (two) times daily for 7 days.   Return in about 1 week (around 08/27/2023) for Abscess Follow up.  I, Jill Parcell, CMA, am acting as scribe for Harris Liming, MD.   Documentation: I have reviewed the above documentation for accuracy and completeness, and I agree with the above.  Harris Liming, MD

## 2023-08-20 NOTE — Progress Notes (Signed)
   08/20/23  CC: gold fiducial marker placement  HPI: 68 y.o. male with prostate cancer who presents today for placement of fiducial markers in anticipation of his upcoming IMRT with Dr. Jacalyn Martin.  Prostate Gold fiducial Marker Placement Procedure   Informed consent was obtained after discussing risks/benefits of the procedure.  A time out was performed to ensure correct patient identity.  Pre-Procedure: - Gentamicin  given prophylactically - PO Levaquin  500 mg also given today  Procedure: - Rectal ultrasound probe was placed without difficulty and the prostate visualized - Prostatic block performed with 10 mL 1% Xylocaine  - 3 fiducial gold seed markers placed, one at right base, one at left base, one at apex of prostate gland under transrectal ultrasound guidance  Post-Procedure: - Patient tolerated the procedure well - He was counseled to seek immediate medical attention if experiences any severe pain, significant bleeding, or fevers - Dr. Jacalyn Martin recommended ADT x 2 years.  We discussed the most common side effects of hot flashes, tiredness, fatigue, decreased libido and erectile dysfunction.  He received an Eligard injection today-6 month depot   Darlynn Elam, MD

## 2023-08-21 DIAGNOSIS — Z9889 Other specified postprocedural states: Secondary | ICD-10-CM | POA: Diagnosis not present

## 2023-08-21 DIAGNOSIS — Z96611 Presence of right artificial shoulder joint: Secondary | ICD-10-CM | POA: Diagnosis not present

## 2023-08-21 DIAGNOSIS — M25511 Pain in right shoulder: Secondary | ICD-10-CM | POA: Diagnosis not present

## 2023-08-21 DIAGNOSIS — M12811 Other specific arthropathies, not elsewhere classified, right shoulder: Secondary | ICD-10-CM | POA: Diagnosis not present

## 2023-08-25 ENCOUNTER — Ambulatory Visit
Admission: RE | Admit: 2023-08-25 | Discharge: 2023-08-25 | Disposition: A | Discharge status: Home / Self Care | Source: Ambulatory Visit | Attending: Radiation Oncology | Admitting: Radiation Oncology

## 2023-08-25 DIAGNOSIS — M25511 Pain in right shoulder: Secondary | ICD-10-CM | POA: Diagnosis not present

## 2023-08-25 DIAGNOSIS — Z96611 Presence of right artificial shoulder joint: Secondary | ICD-10-CM | POA: Diagnosis not present

## 2023-08-25 DIAGNOSIS — Z191 Hormone sensitive malignancy status: Secondary | ICD-10-CM | POA: Diagnosis not present

## 2023-08-25 DIAGNOSIS — C61 Malignant neoplasm of prostate: Secondary | ICD-10-CM | POA: Diagnosis not present

## 2023-08-25 DIAGNOSIS — Z510 Encounter for antineoplastic radiation therapy: Secondary | ICD-10-CM | POA: Diagnosis not present

## 2023-08-26 DIAGNOSIS — Z510 Encounter for antineoplastic radiation therapy: Secondary | ICD-10-CM | POA: Diagnosis not present

## 2023-08-26 DIAGNOSIS — Z191 Hormone sensitive malignancy status: Secondary | ICD-10-CM | POA: Diagnosis not present

## 2023-08-26 DIAGNOSIS — C61 Malignant neoplasm of prostate: Secondary | ICD-10-CM | POA: Diagnosis not present

## 2023-08-27 ENCOUNTER — Encounter: Payer: Self-pay | Admitting: Dermatology

## 2023-08-27 ENCOUNTER — Ambulatory Visit: Admitting: Dermatology

## 2023-08-27 ENCOUNTER — Other Ambulatory Visit: Payer: Self-pay | Admitting: *Deleted

## 2023-08-27 DIAGNOSIS — L0291 Cutaneous abscess, unspecified: Secondary | ICD-10-CM

## 2023-08-27 DIAGNOSIS — L0211 Cutaneous abscess of neck: Secondary | ICD-10-CM

## 2023-08-27 DIAGNOSIS — C61 Malignant neoplasm of prostate: Secondary | ICD-10-CM

## 2023-08-27 NOTE — Progress Notes (Signed)
   Follow-Up Visit   Subjective  Rodney Clark is a 67 y.o. male who presents for the following: 1 week follow up. Abscess at left neck. Tx with I&D 08/10/2023. Finished 2nd course of  Doxycycline  3 days ago. Patient reports improvement.   The patient has spots, moles and lesions to be evaluated, some may be new or changing and the patient may have concern these could be cancer.   The following portions of the chart were reviewed this encounter and updated as appropriate: medications, allergies, medical history  Review of Systems:  No other skin or systemic complaints except as noted in HPI or Assessment and Plan.  Objective  Well appearing patient in no apparent distress; mood and affect are within normal limits.   A focused examination was performed of the following areas: neck  Relevant exam findings are noted in the Assessment and Plan.    Assessment & Plan   MRSA Abscess   Exam: improvement with mild residual erythema and edema with collarette of scale. Hx of I&D 08/10/2023 and 08/13/2023. 08/10/23 culture with MRSA  Plan: Continue to monitor for further improvement. Patient will contact us  if any redness swelling pain drainage recur ABSCESS    Return for AK follow up, with Dr. Linnell Richardson, as scheduled.  Kerstin Peeling, RMA, am acting as scribe for Harris Liming, MD .   Documentation: I have reviewed the above documentation for accuracy and completeness, and I agree with the above.  Harris Liming, MD

## 2023-08-27 NOTE — Patient Instructions (Signed)

## 2023-08-28 DIAGNOSIS — M25511 Pain in right shoulder: Secondary | ICD-10-CM | POA: Diagnosis not present

## 2023-08-28 DIAGNOSIS — Z96611 Presence of right artificial shoulder joint: Secondary | ICD-10-CM | POA: Diagnosis not present

## 2023-08-31 ENCOUNTER — Ambulatory Visit
Admission: RE | Admit: 2023-08-31 | Discharge: 2023-08-31 | Disposition: A | Discharge status: Home / Self Care | Source: Ambulatory Visit | Attending: Internal Medicine | Admitting: Internal Medicine

## 2023-08-31 DIAGNOSIS — I7121 Aneurysm of the ascending aorta, without rupture: Secondary | ICD-10-CM | POA: Diagnosis not present

## 2023-08-31 DIAGNOSIS — I517 Cardiomegaly: Secondary | ICD-10-CM | POA: Diagnosis not present

## 2023-08-31 DIAGNOSIS — I10 Essential (primary) hypertension: Secondary | ICD-10-CM | POA: Diagnosis not present

## 2023-08-31 MED ORDER — IOHEXOL 350 MG/ML SOLN
75.0000 mL | Freq: Once | INTRAVENOUS | Status: AC | PRN
  Administered 2023-08-31: 75 mL via INTRAVENOUS

## 2023-09-02 ENCOUNTER — Ambulatory Visit
Admission: RE | Admit: 2023-09-02 | Discharge: 2023-09-02 | Disposition: A | Discharge status: Home / Self Care | Source: Ambulatory Visit | Attending: Radiation Oncology | Admitting: Radiation Oncology

## 2023-09-02 DIAGNOSIS — G629 Polyneuropathy, unspecified: Secondary | ICD-10-CM | POA: Insufficient documentation

## 2023-09-02 DIAGNOSIS — M4310 Spondylolisthesis, site unspecified: Secondary | ICD-10-CM | POA: Insufficient documentation

## 2023-09-02 DIAGNOSIS — I7121 Aneurysm of the ascending aorta, without rupture: Secondary | ICD-10-CM | POA: Insufficient documentation

## 2023-09-02 DIAGNOSIS — E785 Hyperlipidemia, unspecified: Secondary | ICD-10-CM | POA: Insufficient documentation

## 2023-09-02 DIAGNOSIS — Z79899 Other long term (current) drug therapy: Secondary | ICD-10-CM | POA: Insufficient documentation

## 2023-09-02 DIAGNOSIS — M109 Gout, unspecified: Secondary | ICD-10-CM | POA: Insufficient documentation

## 2023-09-02 DIAGNOSIS — Z510 Encounter for antineoplastic radiation therapy: Secondary | ICD-10-CM | POA: Insufficient documentation

## 2023-09-02 DIAGNOSIS — G4730 Sleep apnea, unspecified: Secondary | ICD-10-CM | POA: Insufficient documentation

## 2023-09-02 DIAGNOSIS — I10 Essential (primary) hypertension: Secondary | ICD-10-CM | POA: Insufficient documentation

## 2023-09-02 DIAGNOSIS — L570 Actinic keratosis: Secondary | ICD-10-CM | POA: Insufficient documentation

## 2023-09-02 DIAGNOSIS — M129 Arthropathy, unspecified: Secondary | ICD-10-CM | POA: Insufficient documentation

## 2023-09-02 DIAGNOSIS — K219 Gastro-esophageal reflux disease without esophagitis: Secondary | ICD-10-CM | POA: Insufficient documentation

## 2023-09-02 DIAGNOSIS — Z7952 Long term (current) use of systemic steroids: Secondary | ICD-10-CM | POA: Insufficient documentation

## 2023-09-02 DIAGNOSIS — Z6841 Body Mass Index (BMI) 40.0 and over, adult: Secondary | ICD-10-CM | POA: Insufficient documentation

## 2023-09-02 DIAGNOSIS — C61 Malignant neoplasm of prostate: Secondary | ICD-10-CM | POA: Insufficient documentation

## 2023-09-02 DIAGNOSIS — Z85828 Personal history of other malignant neoplasm of skin: Secondary | ICD-10-CM | POA: Insufficient documentation

## 2023-09-02 DIAGNOSIS — I712 Thoracic aortic aneurysm, without rupture, unspecified: Secondary | ICD-10-CM | POA: Insufficient documentation

## 2023-09-03 ENCOUNTER — Ambulatory Visit
Admission: RE | Admit: 2023-09-03 | Discharge: 2023-09-03 | Disposition: A | Discharge status: Home / Self Care | Source: Ambulatory Visit | Attending: Radiation Oncology | Admitting: Radiation Oncology

## 2023-09-03 ENCOUNTER — Other Ambulatory Visit: Payer: Self-pay

## 2023-09-03 DIAGNOSIS — M129 Arthropathy, unspecified: Secondary | ICD-10-CM | POA: Diagnosis not present

## 2023-09-03 DIAGNOSIS — Z510 Encounter for antineoplastic radiation therapy: Secondary | ICD-10-CM | POA: Diagnosis not present

## 2023-09-03 DIAGNOSIS — G629 Polyneuropathy, unspecified: Secondary | ICD-10-CM | POA: Diagnosis not present

## 2023-09-03 DIAGNOSIS — L570 Actinic keratosis: Secondary | ICD-10-CM | POA: Diagnosis not present

## 2023-09-03 DIAGNOSIS — K219 Gastro-esophageal reflux disease without esophagitis: Secondary | ICD-10-CM | POA: Diagnosis not present

## 2023-09-03 DIAGNOSIS — Z191 Hormone sensitive malignancy status: Secondary | ICD-10-CM | POA: Diagnosis not present

## 2023-09-03 DIAGNOSIS — C61 Malignant neoplasm of prostate: Secondary | ICD-10-CM | POA: Diagnosis not present

## 2023-09-03 DIAGNOSIS — G4730 Sleep apnea, unspecified: Secondary | ICD-10-CM | POA: Diagnosis not present

## 2023-09-03 DIAGNOSIS — Z6841 Body Mass Index (BMI) 40.0 and over, adult: Secondary | ICD-10-CM | POA: Diagnosis not present

## 2023-09-03 DIAGNOSIS — G4733 Obstructive sleep apnea (adult) (pediatric): Secondary | ICD-10-CM | POA: Diagnosis not present

## 2023-09-03 DIAGNOSIS — E785 Hyperlipidemia, unspecified: Secondary | ICD-10-CM | POA: Diagnosis not present

## 2023-09-03 DIAGNOSIS — Z7952 Long term (current) use of systemic steroids: Secondary | ICD-10-CM | POA: Diagnosis not present

## 2023-09-03 DIAGNOSIS — M109 Gout, unspecified: Secondary | ICD-10-CM | POA: Diagnosis not present

## 2023-09-03 DIAGNOSIS — Z79899 Other long term (current) drug therapy: Secondary | ICD-10-CM | POA: Diagnosis not present

## 2023-09-03 DIAGNOSIS — I712 Thoracic aortic aneurysm, without rupture, unspecified: Secondary | ICD-10-CM | POA: Diagnosis not present

## 2023-09-03 DIAGNOSIS — M4310 Spondylolisthesis, site unspecified: Secondary | ICD-10-CM | POA: Diagnosis not present

## 2023-09-03 DIAGNOSIS — I7121 Aneurysm of the ascending aorta, without rupture: Secondary | ICD-10-CM | POA: Diagnosis not present

## 2023-09-03 DIAGNOSIS — I10 Essential (primary) hypertension: Secondary | ICD-10-CM | POA: Diagnosis not present

## 2023-09-03 DIAGNOSIS — Z85828 Personal history of other malignant neoplasm of skin: Secondary | ICD-10-CM | POA: Diagnosis not present

## 2023-09-03 LAB — RAD ONC ARIA SESSION SUMMARY
Course Elapsed Days: 0
Plan Fractions Treated to Date: 1
Plan Prescribed Dose Per Fraction: 2 Gy
Plan Total Fractions Prescribed: 40
Plan Total Prescribed Dose: 80 Gy
Reference Point Dosage Given to Date: 2 Gy
Reference Point Session Dosage Given: 2 Gy
Session Number: 1

## 2023-09-04 ENCOUNTER — Other Ambulatory Visit: Payer: Self-pay

## 2023-09-04 ENCOUNTER — Ambulatory Visit
Admission: RE | Admit: 2023-09-04 | Discharge: 2023-09-04 | Disposition: A | Discharge status: Home / Self Care | Source: Ambulatory Visit | Attending: Radiation Oncology | Admitting: Radiation Oncology

## 2023-09-04 DIAGNOSIS — C61 Malignant neoplasm of prostate: Secondary | ICD-10-CM | POA: Diagnosis not present

## 2023-09-04 DIAGNOSIS — Z510 Encounter for antineoplastic radiation therapy: Secondary | ICD-10-CM | POA: Diagnosis not present

## 2023-09-04 DIAGNOSIS — Z191 Hormone sensitive malignancy status: Secondary | ICD-10-CM | POA: Diagnosis not present

## 2023-09-04 LAB — RAD ONC ARIA SESSION SUMMARY
Course Elapsed Days: 1
Plan Fractions Treated to Date: 2
Plan Prescribed Dose Per Fraction: 2 Gy
Plan Total Fractions Prescribed: 40
Plan Total Prescribed Dose: 80 Gy
Reference Point Dosage Given to Date: 4 Gy
Reference Point Session Dosage Given: 2 Gy
Session Number: 2

## 2023-09-07 ENCOUNTER — Other Ambulatory Visit: Payer: Self-pay

## 2023-09-07 ENCOUNTER — Ambulatory Visit
Admission: RE | Admit: 2023-09-07 | Discharge: 2023-09-07 | Disposition: A | Discharge status: Home / Self Care | Source: Ambulatory Visit | Attending: Radiation Oncology | Admitting: Radiation Oncology

## 2023-09-07 DIAGNOSIS — Z191 Hormone sensitive malignancy status: Secondary | ICD-10-CM | POA: Diagnosis not present

## 2023-09-07 DIAGNOSIS — C61 Malignant neoplasm of prostate: Secondary | ICD-10-CM | POA: Diagnosis not present

## 2023-09-07 DIAGNOSIS — Z510 Encounter for antineoplastic radiation therapy: Secondary | ICD-10-CM | POA: Diagnosis not present

## 2023-09-07 LAB — RAD ONC ARIA SESSION SUMMARY
Course Elapsed Days: 4
Plan Fractions Treated to Date: 3
Plan Prescribed Dose Per Fraction: 2 Gy
Plan Total Fractions Prescribed: 40
Plan Total Prescribed Dose: 80 Gy
Reference Point Dosage Given to Date: 6 Gy
Reference Point Session Dosage Given: 2 Gy
Session Number: 3

## 2023-09-08 ENCOUNTER — Ambulatory Visit
Admission: RE | Admit: 2023-09-08 | Discharge: 2023-09-08 | Disposition: A | Discharge status: Home / Self Care | Source: Ambulatory Visit | Attending: Radiation Oncology | Admitting: Radiation Oncology

## 2023-09-08 ENCOUNTER — Other Ambulatory Visit: Payer: Self-pay

## 2023-09-08 DIAGNOSIS — C61 Malignant neoplasm of prostate: Secondary | ICD-10-CM | POA: Diagnosis not present

## 2023-09-08 DIAGNOSIS — Z510 Encounter for antineoplastic radiation therapy: Secondary | ICD-10-CM | POA: Diagnosis not present

## 2023-09-08 DIAGNOSIS — Z191 Hormone sensitive malignancy status: Secondary | ICD-10-CM | POA: Diagnosis not present

## 2023-09-08 LAB — RAD ONC ARIA SESSION SUMMARY
Course Elapsed Days: 5
Plan Fractions Treated to Date: 4
Plan Prescribed Dose Per Fraction: 2 Gy
Plan Total Fractions Prescribed: 40
Plan Total Prescribed Dose: 80 Gy
Reference Point Dosage Given to Date: 8 Gy
Reference Point Session Dosage Given: 2 Gy
Session Number: 4

## 2023-09-09 ENCOUNTER — Ambulatory Visit
Admission: RE | Admit: 2023-09-09 | Discharge: 2023-09-09 | Disposition: A | Discharge status: Home / Self Care | Source: Ambulatory Visit | Attending: Radiation Oncology | Admitting: Radiation Oncology

## 2023-09-09 ENCOUNTER — Other Ambulatory Visit: Payer: Self-pay

## 2023-09-09 ENCOUNTER — Inpatient Hospital Stay

## 2023-09-09 DIAGNOSIS — Z191 Hormone sensitive malignancy status: Secondary | ICD-10-CM | POA: Diagnosis not present

## 2023-09-09 DIAGNOSIS — C61 Malignant neoplasm of prostate: Secondary | ICD-10-CM | POA: Insufficient documentation

## 2023-09-09 DIAGNOSIS — Z510 Encounter for antineoplastic radiation therapy: Secondary | ICD-10-CM | POA: Diagnosis not present

## 2023-09-09 LAB — CBC (CANCER CENTER ONLY)
HCT: 44 % (ref 39.0–52.0)
Hemoglobin: 14.8 g/dL (ref 13.0–17.0)
MCH: 28.9 pg (ref 26.0–34.0)
MCHC: 33.6 g/dL (ref 30.0–36.0)
MCV: 85.9 fL (ref 80.0–100.0)
Platelet Count: 259 10*3/uL (ref 150–400)
RBC: 5.12 MIL/uL (ref 4.22–5.81)
RDW: 12.7 % (ref 11.5–15.5)
WBC Count: 5.1 10*3/uL (ref 4.0–10.5)
nRBC: 0 % (ref 0.0–0.2)

## 2023-09-09 LAB — RAD ONC ARIA SESSION SUMMARY
Course Elapsed Days: 6
Plan Fractions Treated to Date: 5
Plan Prescribed Dose Per Fraction: 2 Gy
Plan Total Fractions Prescribed: 40
Plan Total Prescribed Dose: 80 Gy
Reference Point Dosage Given to Date: 10 Gy
Reference Point Session Dosage Given: 2 Gy
Session Number: 5

## 2023-09-10 ENCOUNTER — Ambulatory Visit
Admission: RE | Admit: 2023-09-10 | Discharge: 2023-09-10 | Disposition: A | Discharge status: Home / Self Care | Source: Ambulatory Visit | Attending: Radiation Oncology | Admitting: Radiation Oncology

## 2023-09-10 ENCOUNTER — Other Ambulatory Visit: Payer: Self-pay

## 2023-09-10 DIAGNOSIS — C61 Malignant neoplasm of prostate: Secondary | ICD-10-CM | POA: Diagnosis not present

## 2023-09-10 DIAGNOSIS — Z510 Encounter for antineoplastic radiation therapy: Secondary | ICD-10-CM | POA: Diagnosis not present

## 2023-09-10 DIAGNOSIS — Z191 Hormone sensitive malignancy status: Secondary | ICD-10-CM | POA: Diagnosis not present

## 2023-09-10 LAB — RAD ONC ARIA SESSION SUMMARY
Course Elapsed Days: 7
Plan Fractions Treated to Date: 6
Plan Prescribed Dose Per Fraction: 2 Gy
Plan Total Fractions Prescribed: 40
Plan Total Prescribed Dose: 80 Gy
Reference Point Dosage Given to Date: 12 Gy
Reference Point Session Dosage Given: 2 Gy
Session Number: 6

## 2023-09-11 ENCOUNTER — Ambulatory Visit
Admission: RE | Admit: 2023-09-11 | Discharge: 2023-09-11 | Disposition: A | Discharge status: Home / Self Care | Source: Ambulatory Visit | Attending: Radiation Oncology | Admitting: Radiation Oncology

## 2023-09-11 ENCOUNTER — Other Ambulatory Visit: Payer: Self-pay

## 2023-09-11 DIAGNOSIS — Z510 Encounter for antineoplastic radiation therapy: Secondary | ICD-10-CM | POA: Diagnosis not present

## 2023-09-11 DIAGNOSIS — C61 Malignant neoplasm of prostate: Secondary | ICD-10-CM | POA: Diagnosis not present

## 2023-09-11 DIAGNOSIS — Z191 Hormone sensitive malignancy status: Secondary | ICD-10-CM | POA: Diagnosis not present

## 2023-09-11 LAB — RAD ONC ARIA SESSION SUMMARY
Course Elapsed Days: 8
Plan Fractions Treated to Date: 7
Plan Prescribed Dose Per Fraction: 2 Gy
Plan Total Fractions Prescribed: 40
Plan Total Prescribed Dose: 80 Gy
Reference Point Dosage Given to Date: 14 Gy
Reference Point Session Dosage Given: 2 Gy
Session Number: 7

## 2023-09-14 ENCOUNTER — Ambulatory Visit
Admission: RE | Admit: 2023-09-14 | Discharge: 2023-09-14 | Disposition: A | Discharge status: Home / Self Care | Source: Ambulatory Visit | Attending: Radiation Oncology | Admitting: Radiation Oncology

## 2023-09-14 ENCOUNTER — Other Ambulatory Visit: Payer: Self-pay

## 2023-09-14 DIAGNOSIS — Z510 Encounter for antineoplastic radiation therapy: Secondary | ICD-10-CM | POA: Diagnosis not present

## 2023-09-14 DIAGNOSIS — Z191 Hormone sensitive malignancy status: Secondary | ICD-10-CM | POA: Diagnosis not present

## 2023-09-14 DIAGNOSIS — C61 Malignant neoplasm of prostate: Secondary | ICD-10-CM | POA: Diagnosis not present

## 2023-09-14 LAB — RAD ONC ARIA SESSION SUMMARY
Course Elapsed Days: 11
Plan Fractions Treated to Date: 8
Plan Prescribed Dose Per Fraction: 2 Gy
Plan Total Fractions Prescribed: 40
Plan Total Prescribed Dose: 80 Gy
Reference Point Dosage Given to Date: 16 Gy
Reference Point Session Dosage Given: 2 Gy
Session Number: 8

## 2023-09-15 ENCOUNTER — Ambulatory Visit
Admission: RE | Admit: 2023-09-15 | Discharge: 2023-09-15 | Disposition: A | Discharge status: Home / Self Care | Source: Ambulatory Visit | Attending: Radiation Oncology | Admitting: Radiation Oncology

## 2023-09-15 ENCOUNTER — Other Ambulatory Visit: Payer: Self-pay

## 2023-09-15 DIAGNOSIS — Z510 Encounter for antineoplastic radiation therapy: Secondary | ICD-10-CM | POA: Diagnosis not present

## 2023-09-15 DIAGNOSIS — S76011D Strain of muscle, fascia and tendon of right hip, subsequent encounter: Secondary | ICD-10-CM | POA: Diagnosis not present

## 2023-09-15 DIAGNOSIS — M67959 Unspecified disorder of synovium and tendon, unspecified thigh: Secondary | ICD-10-CM | POA: Diagnosis not present

## 2023-09-15 DIAGNOSIS — C61 Malignant neoplasm of prostate: Secondary | ICD-10-CM | POA: Diagnosis not present

## 2023-09-15 DIAGNOSIS — Z191 Hormone sensitive malignancy status: Secondary | ICD-10-CM | POA: Diagnosis not present

## 2023-09-15 DIAGNOSIS — M7062 Trochanteric bursitis, left hip: Secondary | ICD-10-CM | POA: Diagnosis not present

## 2023-09-15 DIAGNOSIS — M160 Bilateral primary osteoarthritis of hip: Secondary | ICD-10-CM | POA: Diagnosis not present

## 2023-09-15 DIAGNOSIS — G8929 Other chronic pain: Secondary | ICD-10-CM | POA: Diagnosis not present

## 2023-09-15 LAB — RAD ONC ARIA SESSION SUMMARY
Course Elapsed Days: 12
Plan Fractions Treated to Date: 9
Plan Prescribed Dose Per Fraction: 2 Gy
Plan Total Fractions Prescribed: 40
Plan Total Prescribed Dose: 80 Gy
Reference Point Dosage Given to Date: 18 Gy
Reference Point Session Dosage Given: 2 Gy
Session Number: 9

## 2023-09-16 ENCOUNTER — Other Ambulatory Visit: Payer: Self-pay

## 2023-09-16 ENCOUNTER — Ambulatory Visit
Admission: RE | Admit: 2023-09-16 | Discharge: 2023-09-16 | Disposition: A | Discharge status: Home / Self Care | Source: Ambulatory Visit | Attending: Radiation Oncology | Admitting: Radiation Oncology

## 2023-09-16 DIAGNOSIS — Z510 Encounter for antineoplastic radiation therapy: Secondary | ICD-10-CM | POA: Diagnosis not present

## 2023-09-16 DIAGNOSIS — C61 Malignant neoplasm of prostate: Secondary | ICD-10-CM | POA: Diagnosis not present

## 2023-09-16 DIAGNOSIS — Z191 Hormone sensitive malignancy status: Secondary | ICD-10-CM | POA: Diagnosis not present

## 2023-09-16 LAB — RAD ONC ARIA SESSION SUMMARY
Course Elapsed Days: 13
Plan Fractions Treated to Date: 10
Plan Prescribed Dose Per Fraction: 2 Gy
Plan Total Fractions Prescribed: 40
Plan Total Prescribed Dose: 80 Gy
Reference Point Dosage Given to Date: 20 Gy
Reference Point Session Dosage Given: 2 Gy
Session Number: 10

## 2023-09-17 ENCOUNTER — Other Ambulatory Visit: Payer: Self-pay

## 2023-09-17 ENCOUNTER — Ambulatory Visit
Admission: RE | Admit: 2023-09-17 | Discharge: 2023-09-17 | Disposition: A | Discharge status: Home / Self Care | Source: Ambulatory Visit | Attending: Radiation Oncology | Admitting: Radiation Oncology

## 2023-09-17 DIAGNOSIS — C61 Malignant neoplasm of prostate: Secondary | ICD-10-CM | POA: Diagnosis not present

## 2023-09-17 DIAGNOSIS — Z510 Encounter for antineoplastic radiation therapy: Secondary | ICD-10-CM | POA: Diagnosis not present

## 2023-09-17 DIAGNOSIS — Z191 Hormone sensitive malignancy status: Secondary | ICD-10-CM | POA: Diagnosis not present

## 2023-09-17 LAB — RAD ONC ARIA SESSION SUMMARY
Course Elapsed Days: 14
Plan Fractions Treated to Date: 11
Plan Prescribed Dose Per Fraction: 2 Gy
Plan Total Fractions Prescribed: 40
Plan Total Prescribed Dose: 80 Gy
Reference Point Dosage Given to Date: 22 Gy
Reference Point Session Dosage Given: 2 Gy
Session Number: 11

## 2023-09-18 ENCOUNTER — Other Ambulatory Visit: Payer: Self-pay

## 2023-09-18 ENCOUNTER — Ambulatory Visit
Admission: RE | Admit: 2023-09-18 | Discharge: 2023-09-18 | Disposition: A | Discharge status: Home / Self Care | Source: Ambulatory Visit | Attending: Radiation Oncology | Admitting: Radiation Oncology

## 2023-09-18 DIAGNOSIS — Z510 Encounter for antineoplastic radiation therapy: Secondary | ICD-10-CM | POA: Diagnosis not present

## 2023-09-18 DIAGNOSIS — M25562 Pain in left knee: Secondary | ICD-10-CM | POA: Diagnosis not present

## 2023-09-18 DIAGNOSIS — Z191 Hormone sensitive malignancy status: Secondary | ICD-10-CM | POA: Diagnosis not present

## 2023-09-18 DIAGNOSIS — C61 Malignant neoplasm of prostate: Secondary | ICD-10-CM | POA: Diagnosis not present

## 2023-09-18 LAB — RAD ONC ARIA SESSION SUMMARY
Course Elapsed Days: 15
Plan Fractions Treated to Date: 12
Plan Prescribed Dose Per Fraction: 2 Gy
Plan Total Fractions Prescribed: 40
Plan Total Prescribed Dose: 80 Gy
Reference Point Dosage Given to Date: 24 Gy
Reference Point Session Dosage Given: 2 Gy
Session Number: 12

## 2023-09-21 ENCOUNTER — Other Ambulatory Visit: Payer: Self-pay

## 2023-09-21 ENCOUNTER — Ambulatory Visit
Admission: RE | Admit: 2023-09-21 | Discharge: 2023-09-21 | Disposition: A | Discharge status: Home / Self Care | Source: Ambulatory Visit | Attending: Radiation Oncology | Admitting: Radiation Oncology

## 2023-09-21 DIAGNOSIS — Z510 Encounter for antineoplastic radiation therapy: Secondary | ICD-10-CM | POA: Diagnosis not present

## 2023-09-21 DIAGNOSIS — Z191 Hormone sensitive malignancy status: Secondary | ICD-10-CM | POA: Diagnosis not present

## 2023-09-21 DIAGNOSIS — C61 Malignant neoplasm of prostate: Secondary | ICD-10-CM | POA: Diagnosis not present

## 2023-09-21 LAB — RAD ONC ARIA SESSION SUMMARY
Course Elapsed Days: 18
Plan Fractions Treated to Date: 13
Plan Prescribed Dose Per Fraction: 2 Gy
Plan Total Fractions Prescribed: 40
Plan Total Prescribed Dose: 80 Gy
Reference Point Dosage Given to Date: 26 Gy
Reference Point Session Dosage Given: 2 Gy
Session Number: 13

## 2023-09-22 ENCOUNTER — Other Ambulatory Visit: Payer: Self-pay

## 2023-09-22 ENCOUNTER — Ambulatory Visit
Admission: RE | Admit: 2023-09-22 | Discharge: 2023-09-22 | Discharge status: Home / Self Care | Source: Ambulatory Visit | Attending: Radiation Oncology | Admitting: Radiation Oncology

## 2023-09-22 DIAGNOSIS — C61 Malignant neoplasm of prostate: Secondary | ICD-10-CM | POA: Diagnosis not present

## 2023-09-22 DIAGNOSIS — Z510 Encounter for antineoplastic radiation therapy: Secondary | ICD-10-CM | POA: Diagnosis not present

## 2023-09-22 DIAGNOSIS — Z191 Hormone sensitive malignancy status: Secondary | ICD-10-CM | POA: Diagnosis not present

## 2023-09-22 LAB — RAD ONC ARIA SESSION SUMMARY
Course Elapsed Days: 19
Plan Fractions Treated to Date: 14
Plan Prescribed Dose Per Fraction: 2 Gy
Plan Total Fractions Prescribed: 40
Plan Total Prescribed Dose: 80 Gy
Reference Point Dosage Given to Date: 28 Gy
Reference Point Session Dosage Given: 2 Gy
Session Number: 14

## 2023-09-23 ENCOUNTER — Ambulatory Visit
Admission: RE | Admit: 2023-09-23 | Discharge: 2023-09-23 | Disposition: A | Discharge status: Home / Self Care | Source: Ambulatory Visit | Attending: Radiation Oncology | Admitting: Radiation Oncology

## 2023-09-23 ENCOUNTER — Other Ambulatory Visit: Payer: Self-pay

## 2023-09-23 ENCOUNTER — Inpatient Hospital Stay

## 2023-09-23 DIAGNOSIS — Z510 Encounter for antineoplastic radiation therapy: Secondary | ICD-10-CM | POA: Diagnosis not present

## 2023-09-23 DIAGNOSIS — C61 Malignant neoplasm of prostate: Secondary | ICD-10-CM

## 2023-09-23 DIAGNOSIS — Z191 Hormone sensitive malignancy status: Secondary | ICD-10-CM | POA: Diagnosis not present

## 2023-09-23 LAB — RAD ONC ARIA SESSION SUMMARY
Course Elapsed Days: 20
Plan Fractions Treated to Date: 15
Plan Prescribed Dose Per Fraction: 2 Gy
Plan Total Fractions Prescribed: 40
Plan Total Prescribed Dose: 80 Gy
Reference Point Dosage Given to Date: 30 Gy
Reference Point Session Dosage Given: 2 Gy
Session Number: 15

## 2023-09-23 LAB — CBC (CANCER CENTER ONLY)
HCT: 43.9 % (ref 39.0–52.0)
Hemoglobin: 14.8 g/dL (ref 13.0–17.0)
MCH: 29.1 pg (ref 26.0–34.0)
MCHC: 33.7 g/dL (ref 30.0–36.0)
MCV: 86.2 fL (ref 80.0–100.0)
Platelet Count: 198 10*3/uL (ref 150–400)
RBC: 5.09 MIL/uL (ref 4.22–5.81)
RDW: 13 % (ref 11.5–15.5)
WBC Count: 6.8 10*3/uL (ref 4.0–10.5)
nRBC: 0 % (ref 0.0–0.2)

## 2023-09-24 ENCOUNTER — Ambulatory Visit
Admission: RE | Admit: 2023-09-24 | Discharge: 2023-09-24 | Disposition: A | Discharge status: Home / Self Care | Source: Ambulatory Visit | Attending: Radiation Oncology | Admitting: Radiation Oncology

## 2023-09-24 ENCOUNTER — Other Ambulatory Visit: Payer: Self-pay

## 2023-09-24 DIAGNOSIS — C61 Malignant neoplasm of prostate: Secondary | ICD-10-CM | POA: Diagnosis not present

## 2023-09-24 DIAGNOSIS — Z191 Hormone sensitive malignancy status: Secondary | ICD-10-CM | POA: Diagnosis not present

## 2023-09-24 DIAGNOSIS — Z510 Encounter for antineoplastic radiation therapy: Secondary | ICD-10-CM | POA: Diagnosis not present

## 2023-09-24 LAB — RAD ONC ARIA SESSION SUMMARY
Course Elapsed Days: 21
Plan Fractions Treated to Date: 16
Plan Prescribed Dose Per Fraction: 2 Gy
Plan Total Fractions Prescribed: 40
Plan Total Prescribed Dose: 80 Gy
Reference Point Dosage Given to Date: 32 Gy
Reference Point Session Dosage Given: 2 Gy
Session Number: 16

## 2023-09-25 ENCOUNTER — Other Ambulatory Visit: Payer: Self-pay

## 2023-09-25 ENCOUNTER — Ambulatory Visit
Admission: RE | Admit: 2023-09-25 | Discharge: 2023-09-25 | Disposition: A | Discharge status: Home / Self Care | Source: Ambulatory Visit | Attending: Radiation Oncology | Admitting: Radiation Oncology

## 2023-09-25 DIAGNOSIS — C61 Malignant neoplasm of prostate: Secondary | ICD-10-CM | POA: Diagnosis not present

## 2023-09-25 DIAGNOSIS — Z510 Encounter for antineoplastic radiation therapy: Secondary | ICD-10-CM | POA: Diagnosis not present

## 2023-09-25 DIAGNOSIS — Z191 Hormone sensitive malignancy status: Secondary | ICD-10-CM | POA: Diagnosis not present

## 2023-09-25 LAB — RAD ONC ARIA SESSION SUMMARY
Course Elapsed Days: 22
Plan Fractions Treated to Date: 17
Plan Prescribed Dose Per Fraction: 2 Gy
Plan Total Fractions Prescribed: 40
Plan Total Prescribed Dose: 80 Gy
Reference Point Dosage Given to Date: 34 Gy
Reference Point Session Dosage Given: 2 Gy
Session Number: 17

## 2023-09-28 ENCOUNTER — Other Ambulatory Visit: Payer: Self-pay

## 2023-09-28 ENCOUNTER — Ambulatory Visit
Admission: RE | Admit: 2023-09-28 | Discharge: 2023-09-28 | Disposition: A | Discharge status: Home / Self Care | Source: Ambulatory Visit | Attending: Radiation Oncology | Admitting: Radiation Oncology

## 2023-09-28 DIAGNOSIS — Z191 Hormone sensitive malignancy status: Secondary | ICD-10-CM | POA: Diagnosis not present

## 2023-09-28 DIAGNOSIS — C61 Malignant neoplasm of prostate: Secondary | ICD-10-CM | POA: Diagnosis not present

## 2023-09-28 DIAGNOSIS — Z510 Encounter for antineoplastic radiation therapy: Secondary | ICD-10-CM | POA: Diagnosis not present

## 2023-09-28 LAB — RAD ONC ARIA SESSION SUMMARY
Course Elapsed Days: 25
Plan Fractions Treated to Date: 18
Plan Prescribed Dose Per Fraction: 2 Gy
Plan Total Fractions Prescribed: 40
Plan Total Prescribed Dose: 80 Gy
Reference Point Dosage Given to Date: 36 Gy
Reference Point Session Dosage Given: 2 Gy
Session Number: 18

## 2023-09-29 ENCOUNTER — Ambulatory Visit
Admission: RE | Admit: 2023-09-29 | Discharge: 2023-09-29 | Disposition: A | Discharge status: Home / Self Care | Source: Ambulatory Visit | Attending: Radiation Oncology | Admitting: Radiation Oncology

## 2023-09-29 ENCOUNTER — Other Ambulatory Visit: Payer: Self-pay

## 2023-09-29 DIAGNOSIS — M129 Arthropathy, unspecified: Secondary | ICD-10-CM | POA: Insufficient documentation

## 2023-09-29 DIAGNOSIS — C61 Malignant neoplasm of prostate: Secondary | ICD-10-CM | POA: Insufficient documentation

## 2023-09-29 DIAGNOSIS — M4310 Spondylolisthesis, site unspecified: Secondary | ICD-10-CM | POA: Insufficient documentation

## 2023-09-29 DIAGNOSIS — Z7952 Long term (current) use of systemic steroids: Secondary | ICD-10-CM | POA: Diagnosis not present

## 2023-09-29 DIAGNOSIS — Z510 Encounter for antineoplastic radiation therapy: Secondary | ICD-10-CM | POA: Diagnosis not present

## 2023-09-29 DIAGNOSIS — I712 Thoracic aortic aneurysm, without rupture, unspecified: Secondary | ICD-10-CM | POA: Diagnosis not present

## 2023-09-29 DIAGNOSIS — G4730 Sleep apnea, unspecified: Secondary | ICD-10-CM | POA: Insufficient documentation

## 2023-09-29 DIAGNOSIS — Z6841 Body Mass Index (BMI) 40.0 and over, adult: Secondary | ICD-10-CM | POA: Diagnosis not present

## 2023-09-29 DIAGNOSIS — Z79899 Other long term (current) drug therapy: Secondary | ICD-10-CM | POA: Diagnosis not present

## 2023-09-29 DIAGNOSIS — L570 Actinic keratosis: Secondary | ICD-10-CM | POA: Diagnosis not present

## 2023-09-29 DIAGNOSIS — G629 Polyneuropathy, unspecified: Secondary | ICD-10-CM | POA: Diagnosis not present

## 2023-09-29 DIAGNOSIS — Z85828 Personal history of other malignant neoplasm of skin: Secondary | ICD-10-CM | POA: Diagnosis not present

## 2023-09-29 DIAGNOSIS — I7121 Aneurysm of the ascending aorta, without rupture: Secondary | ICD-10-CM | POA: Insufficient documentation

## 2023-09-29 DIAGNOSIS — K219 Gastro-esophageal reflux disease without esophagitis: Secondary | ICD-10-CM | POA: Insufficient documentation

## 2023-09-29 DIAGNOSIS — E785 Hyperlipidemia, unspecified: Secondary | ICD-10-CM | POA: Diagnosis not present

## 2023-09-29 DIAGNOSIS — M109 Gout, unspecified: Secondary | ICD-10-CM | POA: Diagnosis not present

## 2023-09-29 DIAGNOSIS — I10 Essential (primary) hypertension: Secondary | ICD-10-CM | POA: Diagnosis not present

## 2023-09-29 DIAGNOSIS — Z191 Hormone sensitive malignancy status: Secondary | ICD-10-CM | POA: Diagnosis not present

## 2023-09-29 LAB — RAD ONC ARIA SESSION SUMMARY
Course Elapsed Days: 26
Plan Fractions Treated to Date: 19
Plan Prescribed Dose Per Fraction: 2 Gy
Plan Total Fractions Prescribed: 40
Plan Total Prescribed Dose: 80 Gy
Reference Point Dosage Given to Date: 38 Gy
Reference Point Session Dosage Given: 2 Gy
Session Number: 19

## 2023-09-30 ENCOUNTER — Other Ambulatory Visit: Payer: Self-pay

## 2023-09-30 ENCOUNTER — Ambulatory Visit
Admission: RE | Admit: 2023-09-30 | Discharge: 2023-09-30 | Disposition: A | Discharge status: Home / Self Care | Source: Ambulatory Visit | Attending: Radiation Oncology | Admitting: Radiation Oncology

## 2023-09-30 DIAGNOSIS — Z510 Encounter for antineoplastic radiation therapy: Secondary | ICD-10-CM | POA: Diagnosis not present

## 2023-09-30 DIAGNOSIS — C61 Malignant neoplasm of prostate: Secondary | ICD-10-CM | POA: Diagnosis not present

## 2023-09-30 DIAGNOSIS — Z191 Hormone sensitive malignancy status: Secondary | ICD-10-CM | POA: Diagnosis not present

## 2023-09-30 LAB — RAD ONC ARIA SESSION SUMMARY
Course Elapsed Days: 27
Plan Fractions Treated to Date: 20
Plan Prescribed Dose Per Fraction: 2 Gy
Plan Total Fractions Prescribed: 40
Plan Total Prescribed Dose: 80 Gy
Reference Point Dosage Given to Date: 40 Gy
Reference Point Session Dosage Given: 2 Gy
Session Number: 20

## 2023-10-01 ENCOUNTER — Ambulatory Visit
Admission: RE | Admit: 2023-10-01 | Discharge: 2023-10-01 | Disposition: A | Discharge status: Home / Self Care | Source: Ambulatory Visit | Attending: Radiation Oncology | Admitting: Radiation Oncology

## 2023-10-01 ENCOUNTER — Other Ambulatory Visit: Payer: Self-pay

## 2023-10-01 DIAGNOSIS — C61 Malignant neoplasm of prostate: Secondary | ICD-10-CM | POA: Diagnosis not present

## 2023-10-01 DIAGNOSIS — Z510 Encounter for antineoplastic radiation therapy: Secondary | ICD-10-CM | POA: Diagnosis not present

## 2023-10-01 DIAGNOSIS — Z191 Hormone sensitive malignancy status: Secondary | ICD-10-CM | POA: Diagnosis not present

## 2023-10-01 LAB — RAD ONC ARIA SESSION SUMMARY
Course Elapsed Days: 28
Plan Fractions Treated to Date: 21
Plan Prescribed Dose Per Fraction: 2 Gy
Plan Total Fractions Prescribed: 40
Plan Total Prescribed Dose: 80 Gy
Reference Point Dosage Given to Date: 42 Gy
Reference Point Session Dosage Given: 2 Gy
Session Number: 21

## 2023-10-03 DIAGNOSIS — G4733 Obstructive sleep apnea (adult) (pediatric): Secondary | ICD-10-CM | POA: Diagnosis not present

## 2023-10-05 ENCOUNTER — Ambulatory Visit
Admission: RE | Admit: 2023-10-05 | Discharge: 2023-10-05 | Disposition: A | Discharge status: Home / Self Care | Source: Ambulatory Visit | Attending: Radiation Oncology | Admitting: Radiation Oncology

## 2023-10-05 ENCOUNTER — Other Ambulatory Visit: Payer: Self-pay

## 2023-10-05 DIAGNOSIS — Z191 Hormone sensitive malignancy status: Secondary | ICD-10-CM | POA: Diagnosis not present

## 2023-10-05 DIAGNOSIS — C61 Malignant neoplasm of prostate: Secondary | ICD-10-CM | POA: Diagnosis not present

## 2023-10-05 DIAGNOSIS — Z510 Encounter for antineoplastic radiation therapy: Secondary | ICD-10-CM | POA: Diagnosis not present

## 2023-10-05 LAB — RAD ONC ARIA SESSION SUMMARY
Course Elapsed Days: 32
Plan Fractions Treated to Date: 22
Plan Prescribed Dose Per Fraction: 2 Gy
Plan Total Fractions Prescribed: 40
Plan Total Prescribed Dose: 80 Gy
Reference Point Dosage Given to Date: 44 Gy
Reference Point Session Dosage Given: 2 Gy
Session Number: 22

## 2023-10-06 ENCOUNTER — Other Ambulatory Visit: Payer: Self-pay

## 2023-10-06 ENCOUNTER — Ambulatory Visit
Admission: RE | Admit: 2023-10-06 | Discharge: 2023-10-06 | Disposition: A | Discharge status: Home / Self Care | Source: Ambulatory Visit | Attending: Radiation Oncology | Admitting: Radiation Oncology

## 2023-10-06 DIAGNOSIS — C61 Malignant neoplasm of prostate: Secondary | ICD-10-CM | POA: Diagnosis not present

## 2023-10-06 DIAGNOSIS — Z510 Encounter for antineoplastic radiation therapy: Secondary | ICD-10-CM | POA: Diagnosis not present

## 2023-10-06 DIAGNOSIS — Z191 Hormone sensitive malignancy status: Secondary | ICD-10-CM | POA: Diagnosis not present

## 2023-10-06 LAB — RAD ONC ARIA SESSION SUMMARY
Course Elapsed Days: 33
Plan Fractions Treated to Date: 23
Plan Prescribed Dose Per Fraction: 2 Gy
Plan Total Fractions Prescribed: 40
Plan Total Prescribed Dose: 80 Gy
Reference Point Dosage Given to Date: 46 Gy
Reference Point Session Dosage Given: 2 Gy
Session Number: 23

## 2023-10-07 ENCOUNTER — Ambulatory Visit
Admission: RE | Admit: 2023-10-07 | Discharge: 2023-10-07 | Disposition: A | Discharge status: Home / Self Care | Source: Ambulatory Visit | Attending: Radiation Oncology | Admitting: Radiation Oncology

## 2023-10-07 ENCOUNTER — Ambulatory Visit: Payer: PPO | Admitting: Dermatology

## 2023-10-07 ENCOUNTER — Inpatient Hospital Stay

## 2023-10-07 ENCOUNTER — Other Ambulatory Visit: Payer: Self-pay

## 2023-10-07 DIAGNOSIS — M4802 Spinal stenosis, cervical region: Secondary | ICD-10-CM | POA: Diagnosis not present

## 2023-10-07 DIAGNOSIS — Z85828 Personal history of other malignant neoplasm of skin: Secondary | ICD-10-CM

## 2023-10-07 DIAGNOSIS — L821 Other seborrheic keratosis: Secondary | ICD-10-CM

## 2023-10-07 DIAGNOSIS — L578 Other skin changes due to chronic exposure to nonionizing radiation: Secondary | ICD-10-CM

## 2023-10-07 DIAGNOSIS — L03116 Cellulitis of left lower limb: Secondary | ICD-10-CM

## 2023-10-07 DIAGNOSIS — L814 Other melanin hyperpigmentation: Secondary | ICD-10-CM

## 2023-10-07 DIAGNOSIS — Z79899 Other long term (current) drug therapy: Secondary | ICD-10-CM

## 2023-10-07 DIAGNOSIS — M48062 Spinal stenosis, lumbar region with neurogenic claudication: Secondary | ICD-10-CM | POA: Diagnosis not present

## 2023-10-07 DIAGNOSIS — D1801 Hemangioma of skin and subcutaneous tissue: Secondary | ICD-10-CM | POA: Diagnosis not present

## 2023-10-07 DIAGNOSIS — Z1283 Encounter for screening for malignant neoplasm of skin: Secondary | ICD-10-CM

## 2023-10-07 DIAGNOSIS — L570 Actinic keratosis: Secondary | ICD-10-CM

## 2023-10-07 DIAGNOSIS — C61 Malignant neoplasm of prostate: Secondary | ICD-10-CM | POA: Diagnosis not present

## 2023-10-07 DIAGNOSIS — D229 Melanocytic nevi, unspecified: Secondary | ICD-10-CM

## 2023-10-07 DIAGNOSIS — M5412 Radiculopathy, cervical region: Secondary | ICD-10-CM | POA: Diagnosis not present

## 2023-10-07 DIAGNOSIS — W908XXA Exposure to other nonionizing radiation, initial encounter: Secondary | ICD-10-CM | POA: Diagnosis not present

## 2023-10-07 DIAGNOSIS — L820 Inflamed seborrheic keratosis: Secondary | ICD-10-CM

## 2023-10-07 DIAGNOSIS — Z7189 Other specified counseling: Secondary | ICD-10-CM

## 2023-10-07 DIAGNOSIS — M5416 Radiculopathy, lumbar region: Secondary | ICD-10-CM | POA: Diagnosis not present

## 2023-10-07 DIAGNOSIS — Z5111 Encounter for antineoplastic chemotherapy: Secondary | ICD-10-CM

## 2023-10-07 DIAGNOSIS — L03119 Cellulitis of unspecified part of limb: Secondary | ICD-10-CM

## 2023-10-07 DIAGNOSIS — Z191 Hormone sensitive malignancy status: Secondary | ICD-10-CM | POA: Diagnosis not present

## 2023-10-07 DIAGNOSIS — Z8589 Personal history of malignant neoplasm of other organs and systems: Secondary | ICD-10-CM

## 2023-10-07 DIAGNOSIS — Z510 Encounter for antineoplastic radiation therapy: Secondary | ICD-10-CM | POA: Diagnosis not present

## 2023-10-07 LAB — CBC (CANCER CENTER ONLY)
HCT: 39.2 % (ref 39.0–52.0)
Hemoglobin: 13.5 g/dL (ref 13.0–17.0)
MCH: 29.3 pg (ref 26.0–34.0)
MCHC: 34.4 g/dL (ref 30.0–36.0)
MCV: 85.2 fL (ref 80.0–100.0)
Platelet Count: 180 K/uL (ref 150–400)
RBC: 4.6 MIL/uL (ref 4.22–5.81)
RDW: 13.1 % (ref 11.5–15.5)
WBC Count: 3.6 K/uL — ABNORMAL LOW (ref 4.0–10.5)
nRBC: 0 % (ref 0.0–0.2)

## 2023-10-07 LAB — RAD ONC ARIA SESSION SUMMARY
Course Elapsed Days: 34
Plan Fractions Treated to Date: 24
Plan Prescribed Dose Per Fraction: 2 Gy
Plan Total Fractions Prescribed: 40
Plan Total Prescribed Dose: 80 Gy
Reference Point Dosage Given to Date: 48 Gy
Reference Point Session Dosage Given: 2 Gy
Session Number: 24

## 2023-10-07 MED ORDER — FLUOROURACIL 5 % EX CREA: TOPICAL_CREAM | CUTANEOUS | 0 refills | Status: DC

## 2023-10-07 MED ORDER — DOXYCYCLINE MONOHYDRATE 100 MG PO CAPS: ORAL_CAPSULE | ORAL | 0 refills | Status: DC

## 2023-10-07 MED ORDER — MUPIROCIN 2 % EX OINT: TOPICAL_OINTMENT | CUTANEOUS | 2 refills | Status: AC

## 2023-10-07 NOTE — Progress Notes (Unsigned)
 Follow-Up Visit   Subjective  Rodney Clark is a 68 y.o. male who presents for the following: Skin Cancer Screening and Full Body Skin Exam  The patient presents for Total-Body Skin Exam (TBSE) for skin cancer screening and mole check. The patient has spots, moles and lesions to be evaluated, some may be new or changing and the patient may have concern these could be cancer.  The following portions of the chart were reviewed this encounter and updated as appropriate: medications, allergies, medical history  Review of Systems:  No other skin or systemic complaints except as noted in HPI or Assessment and Plan.  Objective  Well appearing patient in no apparent distress; mood and affect are within normal limits.  A full examination was performed including scalp, head, eyes, ears, nose, lips, neck, chest, axillae, abdomen, back, buttocks, bilateral upper extremities, bilateral lower extremities, hands, feet, fingers, toes, fingernails, and toenails. All findings within normal limits unless otherwise noted below.   Relevant physical exam findings are noted in the Assessment and Plan.  Scalp x 3 (3) Erythematous stuck-on, waxy papule or plaque temples x 16 (16) Erythematous thin papules/macules with gritty scale.   Assessment & Plan   SKIN CANCER SCREENING PERFORMED TODAY.  ACTINIC DAMAGE - Chronic condition, secondary to cumulative UV/sun exposure - diffuse scaly erythematous macules with underlying dyspigmentation - Recommend daily broad spectrum sunscreen SPF 30+ to sun-exposed areas, reapply every 2 hours as needed.  - Staying in the shade or wearing long sleeves, sun glasses (UVA+UVB protection) and wide brim hats (4-inch brim around the entire circumference of the hat) are also recommended for sun protection.  - Call for new or changing lesions.  LENTIGINES, SEBORRHEIC KERATOSES, HEMANGIOMAS - Benign normal skin lesions - Benign-appearing - Call for any changes  MELANOCYTIC  NEVI - Tan-brown and/or pink-flesh-colored symmetric macules and papules - Benign appearing on exam today - Observation - Call clinic for new or changing moles - Recommend daily use of broad spectrum spf 30+ sunscreen to sun-exposed areas.   HISTORY OF SQUAMOUS CELL CARCINOMA OF THE SKIN - No evidence of recurrence today - No lymphadenopathy - Recommend regular full body skin exams - Recommend daily broad spectrum sunscreen SPF 30+ to sun-exposed areas, reapply every 2 hours as needed.  - Call if any new or changing lesions are noted between office visits INFLAMED SEBORRHEIC KERATOSIS (3) Scalp x 3 (3) Symptomatic, irritating, patient would like treated. Destruction of lesion - Scalp x 3 (3) Complexity: simple   Destruction method: cryotherapy   Informed consent: discussed and consent obtained   Timeout:  patient name, date of birth, surgical site, and procedure verified Lesion destroyed using liquid nitrogen: Yes   Region frozen until ice ball extended beyond lesion: Yes   Outcome: patient tolerated procedure well with no complications   Post-procedure details: wound care instructions given    ACTINIC KERATOSIS (16) temples x 16 (16) Actinic keratoses are precancerous spots that appear secondary to cumulative UV radiation exposure/sun exposure over time. They are chronic with expected duration over 1 year. A portion of actinic keratoses will progress to squamous cell carcinoma of the skin. It is not possible to reliably predict which spots will progress to skin cancer and so treatment is recommended to prevent development of skin cancer.  Recommend daily broad spectrum sunscreen SPF 30+ to sun-exposed areas, reapply every 2 hours as needed.  Recommend staying in the shade or wearing long sleeves, sun glasses (UVA+UVB protection) and wide brim hats (  4-inch brim around the entire circumference of the hat). Call for new or changing lesions.  ACTINIC DAMAGE WITH PRECANCEROUS ACTINIC  KERATOSES Counseling for Topical Chemotherapy Management: Patient exhibits: - Severe, confluent actinic changes with pre-cancerous actinic keratoses that is secondary to cumulative UV radiation exposure over time - Condition that is severe; chronic, not at goal. - diffuse scaly erythematous macules and papules with underlying dyspigmentation - Discussed Prescription Field Treatment topical Chemotherapy for Severe, Chronic Confluent Actinic Changes with Pre-Cancerous Actinic Keratoses Field treatment involves treatment of an entire area of skin that has confluent Actinic Changes (Sun/ Ultraviolet light damage) and PreCancerous Actinic Keratoses by method of PhotoDynamic Therapy (PDT) and/or prescription Topical Chemotherapy agents such as 5-fluorouracil , 5-fluorouracil /calcipotriene, and/or imiquimod.  The purpose is to decrease the number of clinically evident and subclinical PreCancerous lesions to prevent progression to development of skin cancer by chemically destroying early precancer changes that may or may not be visible.  It has been shown to reduce the risk of developing skin cancer in the treated area. As a result of treatment, redness, scaling, crusting, and open sores may occur during treatment course. One or more than one of these methods may be used and may have to be used several times to control, suppress and eliminate the PreCancerous changes. Discussed treatment course, expected reaction, and possible side effects. - Recommend daily broad spectrum sunscreen SPF 30+ to sun-exposed areas, reapply every 2 hours as needed.  - Staying in the shade or wearing long sleeves, sun glasses (UVA+UVB protection) and wide brim hats (4-inch brim around the entire circumference of the hat) are also recommended. - Call for new or changing lesions.  - Starting September 1st apply 5FU/Calcipotriene mix BID x 7 days.  Destruction of lesion - temples x 16 (16) Complexity: simple   Destruction method:  cryotherapy   Informed consent: discussed and consent obtained   Timeout:  patient name, date of birth, surgical site, and procedure verified Lesion destroyed using liquid nitrogen: Yes   Region frozen until ice ball extended beyond lesion: Yes   Outcome: patient tolerated procedure well with no complications   Post-procedure details: wound care instructions given     Currently being treated with radiation due to prostate cancer - pt reports, low blood cell count at this time.  Early cellulitis around injury on the L lower leg -     Start Mupirocin  2% ointment TID, Doxycycline  100 mg po BID x 1 weeks. Doxycycline  should be taken with food to prevent nausea. Do not lay down for 30 minutes after taking. Be cautious with sun exposure and use good sun protection while on this medication. Pregnant women should not take this medication.   Return in about 6 months (around 04/08/2024) for TBSE - hx SCC, AKs, ISKs.  LILLETTE Rosina Mayans, CMA, am acting as scribe for Alm Rhyme, MD .   Documentation: I have reviewed the above documentation for accuracy and completeness, and I agree with the above.  Alm Rhyme, MD

## 2023-10-07 NOTE — Patient Instructions (Signed)

## 2023-10-08 ENCOUNTER — Encounter: Payer: Self-pay | Admitting: Dermatology

## 2023-10-08 ENCOUNTER — Ambulatory Visit
Admission: RE | Admit: 2023-10-08 | Discharge: 2023-10-08 | Disposition: A | Discharge status: Home / Self Care | Source: Ambulatory Visit | Attending: Radiation Oncology | Admitting: Radiation Oncology

## 2023-10-08 ENCOUNTER — Other Ambulatory Visit: Payer: Self-pay

## 2023-10-08 DIAGNOSIS — Z510 Encounter for antineoplastic radiation therapy: Secondary | ICD-10-CM | POA: Diagnosis not present

## 2023-10-08 DIAGNOSIS — C61 Malignant neoplasm of prostate: Secondary | ICD-10-CM | POA: Diagnosis not present

## 2023-10-08 DIAGNOSIS — Z191 Hormone sensitive malignancy status: Secondary | ICD-10-CM | POA: Diagnosis not present

## 2023-10-08 LAB — RAD ONC ARIA SESSION SUMMARY
Course Elapsed Days: 35
Plan Fractions Treated to Date: 25
Plan Prescribed Dose Per Fraction: 2 Gy
Plan Total Fractions Prescribed: 40
Plan Total Prescribed Dose: 80 Gy
Reference Point Dosage Given to Date: 50 Gy
Reference Point Session Dosage Given: 2 Gy
Session Number: 25

## 2023-10-09 ENCOUNTER — Ambulatory Visit
Admission: RE | Admit: 2023-10-09 | Discharge: 2023-10-09 | Disposition: A | Discharge status: Home / Self Care | Source: Ambulatory Visit | Attending: Radiation Oncology | Admitting: Radiation Oncology

## 2023-10-09 ENCOUNTER — Other Ambulatory Visit: Payer: Self-pay

## 2023-10-09 DIAGNOSIS — Z191 Hormone sensitive malignancy status: Secondary | ICD-10-CM | POA: Diagnosis not present

## 2023-10-09 DIAGNOSIS — Z510 Encounter for antineoplastic radiation therapy: Secondary | ICD-10-CM | POA: Diagnosis not present

## 2023-10-09 DIAGNOSIS — C61 Malignant neoplasm of prostate: Secondary | ICD-10-CM | POA: Diagnosis not present

## 2023-10-09 LAB — RAD ONC ARIA SESSION SUMMARY
Course Elapsed Days: 36
Plan Fractions Treated to Date: 26
Plan Prescribed Dose Per Fraction: 2 Gy
Plan Total Fractions Prescribed: 40
Plan Total Prescribed Dose: 80 Gy
Reference Point Dosage Given to Date: 52 Gy
Reference Point Session Dosage Given: 2 Gy
Session Number: 26

## 2023-10-12 ENCOUNTER — Ambulatory Visit
Admission: RE | Admit: 2023-10-12 | Discharge: 2023-10-12 | Disposition: A | Discharge status: Home / Self Care | Source: Ambulatory Visit | Attending: Radiation Oncology | Admitting: Radiation Oncology

## 2023-10-12 ENCOUNTER — Other Ambulatory Visit: Payer: Self-pay

## 2023-10-12 DIAGNOSIS — C61 Malignant neoplasm of prostate: Secondary | ICD-10-CM | POA: Diagnosis not present

## 2023-10-12 DIAGNOSIS — Z191 Hormone sensitive malignancy status: Secondary | ICD-10-CM | POA: Diagnosis not present

## 2023-10-12 DIAGNOSIS — Z510 Encounter for antineoplastic radiation therapy: Secondary | ICD-10-CM | POA: Diagnosis not present

## 2023-10-12 LAB — RAD ONC ARIA SESSION SUMMARY
Course Elapsed Days: 39
Plan Fractions Treated to Date: 27
Plan Prescribed Dose Per Fraction: 2 Gy
Plan Total Fractions Prescribed: 40
Plan Total Prescribed Dose: 80 Gy
Reference Point Dosage Given to Date: 54 Gy
Reference Point Session Dosage Given: 2 Gy
Session Number: 27

## 2023-10-13 ENCOUNTER — Ambulatory Visit
Admission: RE | Admit: 2023-10-13 | Discharge: 2023-10-13 | Disposition: A | Discharge status: Home / Self Care | Source: Ambulatory Visit | Attending: Radiation Oncology | Admitting: Radiation Oncology

## 2023-10-13 ENCOUNTER — Other Ambulatory Visit: Payer: Self-pay

## 2023-10-13 DIAGNOSIS — C61 Malignant neoplasm of prostate: Secondary | ICD-10-CM | POA: Diagnosis not present

## 2023-10-13 DIAGNOSIS — Z510 Encounter for antineoplastic radiation therapy: Secondary | ICD-10-CM | POA: Diagnosis not present

## 2023-10-13 DIAGNOSIS — Z191 Hormone sensitive malignancy status: Secondary | ICD-10-CM | POA: Diagnosis not present

## 2023-10-13 LAB — RAD ONC ARIA SESSION SUMMARY
Course Elapsed Days: 40
Plan Fractions Treated to Date: 28
Plan Prescribed Dose Per Fraction: 2 Gy
Plan Total Fractions Prescribed: 40
Plan Total Prescribed Dose: 80 Gy
Reference Point Dosage Given to Date: 56 Gy
Reference Point Session Dosage Given: 2 Gy
Session Number: 28

## 2023-10-14 ENCOUNTER — Ambulatory Visit
Admission: RE | Admit: 2023-10-14 | Discharge: 2023-10-14 | Disposition: A | Discharge status: Home / Self Care | Source: Ambulatory Visit | Attending: Radiation Oncology | Admitting: Radiation Oncology

## 2023-10-14 ENCOUNTER — Other Ambulatory Visit: Payer: Self-pay

## 2023-10-14 DIAGNOSIS — Z510 Encounter for antineoplastic radiation therapy: Secondary | ICD-10-CM | POA: Diagnosis not present

## 2023-10-14 DIAGNOSIS — Z191 Hormone sensitive malignancy status: Secondary | ICD-10-CM | POA: Diagnosis not present

## 2023-10-14 DIAGNOSIS — C61 Malignant neoplasm of prostate: Secondary | ICD-10-CM | POA: Diagnosis not present

## 2023-10-14 LAB — RAD ONC ARIA SESSION SUMMARY
Course Elapsed Days: 41
Plan Fractions Treated to Date: 29
Plan Prescribed Dose Per Fraction: 2 Gy
Plan Total Fractions Prescribed: 40
Plan Total Prescribed Dose: 80 Gy
Reference Point Dosage Given to Date: 58 Gy
Reference Point Session Dosage Given: 2 Gy
Session Number: 29

## 2023-10-15 ENCOUNTER — Ambulatory Visit
Admission: RE | Admit: 2023-10-15 | Discharge: 2023-10-15 | Disposition: A | Discharge status: Home / Self Care | Source: Ambulatory Visit | Attending: Radiation Oncology | Admitting: Radiation Oncology

## 2023-10-15 ENCOUNTER — Other Ambulatory Visit: Payer: Self-pay

## 2023-10-15 DIAGNOSIS — C61 Malignant neoplasm of prostate: Secondary | ICD-10-CM | POA: Diagnosis not present

## 2023-10-15 DIAGNOSIS — Z191 Hormone sensitive malignancy status: Secondary | ICD-10-CM | POA: Diagnosis not present

## 2023-10-15 DIAGNOSIS — Z510 Encounter for antineoplastic radiation therapy: Secondary | ICD-10-CM | POA: Diagnosis not present

## 2023-10-15 LAB — RAD ONC ARIA SESSION SUMMARY
Course Elapsed Days: 42
Plan Fractions Treated to Date: 30
Plan Prescribed Dose Per Fraction: 2 Gy
Plan Total Fractions Prescribed: 40
Plan Total Prescribed Dose: 80 Gy
Reference Point Dosage Given to Date: 60 Gy
Reference Point Session Dosage Given: 2 Gy
Session Number: 30

## 2023-10-16 ENCOUNTER — Ambulatory Visit
Admission: RE | Admit: 2023-10-16 | Discharge: 2023-10-16 | Disposition: A | Discharge status: Home / Self Care | Source: Ambulatory Visit | Attending: Radiation Oncology | Admitting: Radiation Oncology

## 2023-10-16 ENCOUNTER — Other Ambulatory Visit: Payer: Self-pay

## 2023-10-16 DIAGNOSIS — C61 Malignant neoplasm of prostate: Secondary | ICD-10-CM | POA: Diagnosis not present

## 2023-10-16 DIAGNOSIS — Z510 Encounter for antineoplastic radiation therapy: Secondary | ICD-10-CM | POA: Diagnosis not present

## 2023-10-16 DIAGNOSIS — Z191 Hormone sensitive malignancy status: Secondary | ICD-10-CM | POA: Diagnosis not present

## 2023-10-16 LAB — RAD ONC ARIA SESSION SUMMARY
Course Elapsed Days: 43
Plan Fractions Treated to Date: 31
Plan Prescribed Dose Per Fraction: 2 Gy
Plan Total Fractions Prescribed: 40
Plan Total Prescribed Dose: 80 Gy
Reference Point Dosage Given to Date: 62 Gy
Reference Point Session Dosage Given: 2 Gy
Session Number: 31

## 2023-10-19 ENCOUNTER — Other Ambulatory Visit: Payer: Self-pay

## 2023-10-19 ENCOUNTER — Ambulatory Visit
Admission: RE | Admit: 2023-10-19 | Discharge: 2023-10-19 | Disposition: A | Discharge status: Home / Self Care | Source: Ambulatory Visit | Attending: Radiation Oncology | Admitting: Radiation Oncology

## 2023-10-19 DIAGNOSIS — Z510 Encounter for antineoplastic radiation therapy: Secondary | ICD-10-CM | POA: Diagnosis not present

## 2023-10-19 DIAGNOSIS — Z191 Hormone sensitive malignancy status: Secondary | ICD-10-CM | POA: Diagnosis not present

## 2023-10-19 DIAGNOSIS — C61 Malignant neoplasm of prostate: Secondary | ICD-10-CM | POA: Diagnosis not present

## 2023-10-19 LAB — RAD ONC ARIA SESSION SUMMARY
Course Elapsed Days: 46
Plan Fractions Treated to Date: 32
Plan Prescribed Dose Per Fraction: 2 Gy
Plan Total Fractions Prescribed: 40
Plan Total Prescribed Dose: 80 Gy
Reference Point Dosage Given to Date: 64 Gy
Reference Point Session Dosage Given: 2 Gy
Session Number: 32

## 2023-10-20 ENCOUNTER — Other Ambulatory Visit: Payer: Self-pay

## 2023-10-20 ENCOUNTER — Ambulatory Visit
Admission: RE | Admit: 2023-10-20 | Discharge: 2023-10-20 | Discharge status: Home / Self Care | Source: Ambulatory Visit | Attending: Radiation Oncology | Admitting: Radiation Oncology

## 2023-10-20 DIAGNOSIS — Z191 Hormone sensitive malignancy status: Secondary | ICD-10-CM | POA: Diagnosis not present

## 2023-10-20 DIAGNOSIS — C61 Malignant neoplasm of prostate: Secondary | ICD-10-CM | POA: Diagnosis not present

## 2023-10-20 DIAGNOSIS — Z510 Encounter for antineoplastic radiation therapy: Secondary | ICD-10-CM | POA: Diagnosis not present

## 2023-10-20 LAB — RAD ONC ARIA SESSION SUMMARY
Course Elapsed Days: 47
Plan Fractions Treated to Date: 33
Plan Prescribed Dose Per Fraction: 2 Gy
Plan Total Fractions Prescribed: 40
Plan Total Prescribed Dose: 80 Gy
Reference Point Dosage Given to Date: 66 Gy
Reference Point Session Dosage Given: 2 Gy
Session Number: 33

## 2023-10-21 ENCOUNTER — Other Ambulatory Visit: Payer: Self-pay

## 2023-10-21 ENCOUNTER — Inpatient Hospital Stay

## 2023-10-21 ENCOUNTER — Ambulatory Visit
Admission: RE | Admit: 2023-10-21 | Discharge: 2023-10-21 | Disposition: A | Discharge status: Home / Self Care | Source: Ambulatory Visit | Attending: Radiation Oncology | Admitting: Radiation Oncology

## 2023-10-21 DIAGNOSIS — C61 Malignant neoplasm of prostate: Secondary | ICD-10-CM | POA: Diagnosis not present

## 2023-10-21 DIAGNOSIS — Z510 Encounter for antineoplastic radiation therapy: Secondary | ICD-10-CM | POA: Diagnosis not present

## 2023-10-21 DIAGNOSIS — Z191 Hormone sensitive malignancy status: Secondary | ICD-10-CM | POA: Diagnosis not present

## 2023-10-21 LAB — CBC (CANCER CENTER ONLY)
HCT: 41.9 % (ref 39.0–52.0)
Hemoglobin: 14.6 g/dL (ref 13.0–17.0)
MCH: 29.8 pg (ref 26.0–34.0)
MCHC: 34.8 g/dL (ref 30.0–36.0)
MCV: 85.5 fL (ref 80.0–100.0)
Platelet Count: 185 K/uL (ref 150–400)
RBC: 4.9 MIL/uL (ref 4.22–5.81)
RDW: 13.9 % (ref 11.5–15.5)
WBC Count: 6.1 K/uL (ref 4.0–10.5)
nRBC: 0 % (ref 0.0–0.2)

## 2023-10-21 LAB — RAD ONC ARIA SESSION SUMMARY
Course Elapsed Days: 48
Plan Fractions Treated to Date: 34
Plan Prescribed Dose Per Fraction: 2 Gy
Plan Total Fractions Prescribed: 40
Plan Total Prescribed Dose: 80 Gy
Reference Point Dosage Given to Date: 68 Gy
Reference Point Session Dosage Given: 2 Gy
Session Number: 34

## 2023-10-22 ENCOUNTER — Ambulatory Visit
Admission: RE | Admit: 2023-10-22 | Discharge: 2023-10-22 | Disposition: A | Discharge status: Home / Self Care | Source: Ambulatory Visit | Attending: Radiation Oncology | Admitting: Radiation Oncology

## 2023-10-22 ENCOUNTER — Other Ambulatory Visit: Payer: Self-pay

## 2023-10-22 DIAGNOSIS — Z510 Encounter for antineoplastic radiation therapy: Secondary | ICD-10-CM | POA: Diagnosis not present

## 2023-10-22 DIAGNOSIS — Z191 Hormone sensitive malignancy status: Secondary | ICD-10-CM | POA: Diagnosis not present

## 2023-10-22 DIAGNOSIS — C61 Malignant neoplasm of prostate: Secondary | ICD-10-CM | POA: Diagnosis not present

## 2023-10-22 LAB — RAD ONC ARIA SESSION SUMMARY
Course Elapsed Days: 49
Plan Fractions Treated to Date: 35
Plan Prescribed Dose Per Fraction: 2 Gy
Plan Total Fractions Prescribed: 40
Plan Total Prescribed Dose: 80 Gy
Reference Point Dosage Given to Date: 70 Gy
Reference Point Session Dosage Given: 2 Gy
Session Number: 35

## 2023-10-23 ENCOUNTER — Other Ambulatory Visit: Payer: Self-pay

## 2023-10-23 ENCOUNTER — Ambulatory Visit
Admission: RE | Admit: 2023-10-23 | Discharge: 2023-10-23 | Disposition: A | Discharge status: Home / Self Care | Source: Ambulatory Visit | Attending: Radiation Oncology | Admitting: Radiation Oncology

## 2023-10-23 DIAGNOSIS — Z510 Encounter for antineoplastic radiation therapy: Secondary | ICD-10-CM | POA: Diagnosis not present

## 2023-10-23 DIAGNOSIS — C61 Malignant neoplasm of prostate: Secondary | ICD-10-CM | POA: Diagnosis not present

## 2023-10-23 DIAGNOSIS — Z191 Hormone sensitive malignancy status: Secondary | ICD-10-CM | POA: Diagnosis not present

## 2023-10-23 LAB — RAD ONC ARIA SESSION SUMMARY
Course Elapsed Days: 50
Plan Fractions Treated to Date: 36
Plan Prescribed Dose Per Fraction: 2 Gy
Plan Total Fractions Prescribed: 40
Plan Total Prescribed Dose: 80 Gy
Reference Point Dosage Given to Date: 72 Gy
Reference Point Session Dosage Given: 2 Gy
Session Number: 36

## 2023-10-26 ENCOUNTER — Other Ambulatory Visit: Payer: Self-pay

## 2023-10-26 ENCOUNTER — Ambulatory Visit
Admission: RE | Admit: 2023-10-26 | Discharge: 2023-10-26 | Disposition: A | Discharge status: Home / Self Care | Source: Ambulatory Visit | Attending: Radiation Oncology | Admitting: Radiation Oncology

## 2023-10-26 DIAGNOSIS — Z510 Encounter for antineoplastic radiation therapy: Secondary | ICD-10-CM | POA: Diagnosis not present

## 2023-10-26 DIAGNOSIS — C61 Malignant neoplasm of prostate: Secondary | ICD-10-CM | POA: Diagnosis not present

## 2023-10-26 DIAGNOSIS — Z191 Hormone sensitive malignancy status: Secondary | ICD-10-CM | POA: Diagnosis not present

## 2023-10-26 LAB — RAD ONC ARIA SESSION SUMMARY
Course Elapsed Days: 53
Plan Fractions Treated to Date: 37
Plan Prescribed Dose Per Fraction: 2 Gy
Plan Total Fractions Prescribed: 40
Plan Total Prescribed Dose: 80 Gy
Reference Point Dosage Given to Date: 74 Gy
Reference Point Session Dosage Given: 2 Gy
Session Number: 37

## 2023-10-27 ENCOUNTER — Other Ambulatory Visit: Payer: Self-pay

## 2023-10-27 ENCOUNTER — Ambulatory Visit
Admission: RE | Admit: 2023-10-27 | Discharge: 2023-10-27 | Disposition: A | Discharge status: Home / Self Care | Source: Ambulatory Visit | Attending: Radiation Oncology | Admitting: Radiation Oncology

## 2023-10-27 DIAGNOSIS — Z191 Hormone sensitive malignancy status: Secondary | ICD-10-CM | POA: Diagnosis not present

## 2023-10-27 DIAGNOSIS — R7303 Prediabetes: Secondary | ICD-10-CM | POA: Diagnosis not present

## 2023-10-27 DIAGNOSIS — I10 Essential (primary) hypertension: Secondary | ICD-10-CM | POA: Diagnosis not present

## 2023-10-27 DIAGNOSIS — E7849 Other hyperlipidemia: Secondary | ICD-10-CM | POA: Diagnosis not present

## 2023-10-27 DIAGNOSIS — K219 Gastro-esophageal reflux disease without esophagitis: Secondary | ICD-10-CM | POA: Diagnosis not present

## 2023-10-27 DIAGNOSIS — C61 Malignant neoplasm of prostate: Secondary | ICD-10-CM | POA: Diagnosis not present

## 2023-10-27 DIAGNOSIS — M519 Unspecified thoracic, thoracolumbar and lumbosacral intervertebral disc disorder: Secondary | ICD-10-CM | POA: Diagnosis not present

## 2023-10-27 DIAGNOSIS — R972 Elevated prostate specific antigen [PSA]: Secondary | ICD-10-CM | POA: Diagnosis not present

## 2023-10-27 DIAGNOSIS — Z510 Encounter for antineoplastic radiation therapy: Secondary | ICD-10-CM | POA: Diagnosis not present

## 2023-10-27 LAB — RAD ONC ARIA SESSION SUMMARY
Course Elapsed Days: 54
Plan Fractions Treated to Date: 38
Plan Prescribed Dose Per Fraction: 2 Gy
Plan Total Fractions Prescribed: 40
Plan Total Prescribed Dose: 80 Gy
Reference Point Dosage Given to Date: 76 Gy
Reference Point Session Dosage Given: 2 Gy
Session Number: 38

## 2023-10-28 ENCOUNTER — Ambulatory Visit
Admission: RE | Admit: 2023-10-28 | Discharge: 2023-10-28 | Disposition: A | Discharge status: Home / Self Care | Source: Ambulatory Visit | Attending: Radiation Oncology | Admitting: Radiation Oncology

## 2023-10-28 ENCOUNTER — Other Ambulatory Visit: Payer: Self-pay

## 2023-10-28 DIAGNOSIS — Z191 Hormone sensitive malignancy status: Secondary | ICD-10-CM | POA: Diagnosis not present

## 2023-10-28 DIAGNOSIS — C61 Malignant neoplasm of prostate: Secondary | ICD-10-CM | POA: Diagnosis not present

## 2023-10-28 DIAGNOSIS — Z510 Encounter for antineoplastic radiation therapy: Secondary | ICD-10-CM | POA: Diagnosis not present

## 2023-10-28 LAB — RAD ONC ARIA SESSION SUMMARY
Course Elapsed Days: 55
Plan Fractions Treated to Date: 39
Plan Prescribed Dose Per Fraction: 2 Gy
Plan Total Fractions Prescribed: 40
Plan Total Prescribed Dose: 80 Gy
Reference Point Dosage Given to Date: 78 Gy
Reference Point Session Dosage Given: 2 Gy
Session Number: 39

## 2023-10-29 ENCOUNTER — Other Ambulatory Visit: Payer: Self-pay

## 2023-10-29 ENCOUNTER — Ambulatory Visit
Admission: RE | Admit: 2023-10-29 | Discharge: 2023-10-29 | Disposition: A | Discharge status: Home / Self Care | Source: Ambulatory Visit | Attending: Radiation Oncology | Admitting: Radiation Oncology

## 2023-10-29 DIAGNOSIS — Z510 Encounter for antineoplastic radiation therapy: Secondary | ICD-10-CM | POA: Diagnosis not present

## 2023-10-29 DIAGNOSIS — Z191 Hormone sensitive malignancy status: Secondary | ICD-10-CM | POA: Diagnosis not present

## 2023-10-29 DIAGNOSIS — C61 Malignant neoplasm of prostate: Secondary | ICD-10-CM | POA: Diagnosis not present

## 2023-10-29 LAB — RAD ONC ARIA SESSION SUMMARY
Course Elapsed Days: 56
Plan Fractions Treated to Date: 40
Plan Prescribed Dose Per Fraction: 2 Gy
Plan Total Fractions Prescribed: 40
Plan Total Prescribed Dose: 80 Gy
Reference Point Dosage Given to Date: 80 Gy
Reference Point Session Dosage Given: 2 Gy
Session Number: 40

## 2023-10-30 NOTE — Radiation Completion Notes (Signed)
 Patient Name: Rodney Clark, Rodney Clark MRN: [id number] Date of Birth: 02-06-1956 Referring Physician: REDELL BURNET, M.D. Date of Service: 2023-10-30 Radiation Oncologist: Marcey Penton, M.D. [facility] [place]                             RADIATION ONCOLOGY END OF TREATMENT NOTE     Diagnosis: C61 Malignant neoplasm of prostate Intent: Curative     HPI: Patient is a 68 year old male with morbid obesity who has had a slowly rising PSA most recently up to 11.4.  This prompted transrectal ultrasound-guided biopsy showing 6 of 12 cores positive for adenocarcinoma 2 cores were Gleason 8 (4+4).  Other cores were combination of Gleason 7 (3+4) as well as Gleason 6 (3+3).  Patient has had a PSMA PET scan performed showing intense activity in the right lobe of the prostate gland consistent with primary prostate adenocarcinoma no evidence of metastatic adenopathy in the pelvis or periaortic retroperitoneum.  There is also no evidence of visceral or skeletal metastasis.  Patient is fairly asymptomatic has no significant increase in lower urinary tract symptoms.  Bowel function is good does have arthritic type pain nothing focal.  On recommendation of urology he would be a difficult surgical candidate based on his BMI.  He is now referred to radiation oncology for consideration of treatment.      ==========DELIVERED PLANS==========  First Treatment Date: 2023-09-03 Last Treatment Date: 2023-10-29   Plan Name: Prostate Site: Prostate Technique: IMRT Mode: Photon Dose Per Fraction: 2 Gy Prescribed Dose (Delivered / Prescribed): 80 Gy / 80 Gy Prescribed Fxs (Delivered / Prescribed): 40 / 40     ==========ON TREATMENT VISIT DATES========== 2023-09-08, 2023-09-15, 2023-09-22, 2023-09-30, 2023-10-06, 2023-10-13, 2023-10-20, 2023-10-27     ==========UPCOMING VISITS========== 12/14/2023 [facility] RAD ONCOLOGY FOLLOW UP 30 Chrystal, Marcey, MD        ==========APPENDIX - ON TREATMENT  VISIT NOTES==========   See weekly On Treatment Notes in Epic for details in the Media tab (listed as Progress notes on the On Treatment Visit Dates listed above).

## 2023-11-03 DIAGNOSIS — I10 Essential (primary) hypertension: Secondary | ICD-10-CM | POA: Diagnosis not present

## 2023-11-03 DIAGNOSIS — E118 Type 2 diabetes mellitus with unspecified complications: Secondary | ICD-10-CM | POA: Diagnosis not present

## 2023-11-03 DIAGNOSIS — K219 Gastro-esophageal reflux disease without esophagitis: Secondary | ICD-10-CM | POA: Diagnosis not present

## 2023-11-03 DIAGNOSIS — G4733 Obstructive sleep apnea (adult) (pediatric): Secondary | ICD-10-CM | POA: Diagnosis not present

## 2023-11-03 DIAGNOSIS — E7849 Other hyperlipidemia: Secondary | ICD-10-CM | POA: Diagnosis not present

## 2023-11-03 DIAGNOSIS — M519 Unspecified thoracic, thoracolumbar and lumbosacral intervertebral disc disorder: Secondary | ICD-10-CM | POA: Diagnosis not present

## 2023-11-03 DIAGNOSIS — C61 Malignant neoplasm of prostate: Secondary | ICD-10-CM | POA: Diagnosis not present

## 2023-11-03 DIAGNOSIS — M5090 Cervical disc disorder, unspecified, unspecified cervical region: Secondary | ICD-10-CM | POA: Diagnosis not present

## 2023-11-03 DIAGNOSIS — I7121 Aneurysm of the ascending aorta, without rupture: Secondary | ICD-10-CM | POA: Diagnosis not present

## 2023-11-19 ENCOUNTER — Other Ambulatory Visit: Payer: Self-pay

## 2023-11-19 MED ORDER — MOUNJARO 2.5 MG/0.5ML ~~LOC~~ SOAJ
2.5000 mg | SUBCUTANEOUS | 5 refills | Status: AC
  Filled 2023-11-19: qty 2, 28d supply, fill #0
  Filled 2023-12-13: qty 2, 28d supply, fill #1
  Filled 2024-01-10: qty 2, 28d supply, fill #2

## 2023-11-20 DIAGNOSIS — M1611 Unilateral primary osteoarthritis, right hip: Secondary | ICD-10-CM | POA: Diagnosis not present

## 2023-11-20 DIAGNOSIS — Z96611 Presence of right artificial shoulder joint: Secondary | ICD-10-CM | POA: Diagnosis not present

## 2023-11-20 DIAGNOSIS — M12811 Other specific arthropathies, not elsewhere classified, right shoulder: Secondary | ICD-10-CM | POA: Diagnosis not present

## 2023-11-23 DIAGNOSIS — M4802 Spinal stenosis, cervical region: Secondary | ICD-10-CM | POA: Diagnosis not present

## 2023-11-23 DIAGNOSIS — M47816 Spondylosis without myelopathy or radiculopathy, lumbar region: Secondary | ICD-10-CM | POA: Diagnosis not present

## 2023-11-23 DIAGNOSIS — M5412 Radiculopathy, cervical region: Secondary | ICD-10-CM | POA: Diagnosis not present

## 2023-11-23 DIAGNOSIS — M48062 Spinal stenosis, lumbar region with neurogenic claudication: Secondary | ICD-10-CM | POA: Diagnosis not present

## 2023-11-23 DIAGNOSIS — M5416 Radiculopathy, lumbar region: Secondary | ICD-10-CM | POA: Diagnosis not present

## 2023-11-24 ENCOUNTER — Other Ambulatory Visit: Payer: Self-pay | Admitting: Surgery

## 2023-12-04 DIAGNOSIS — G4733 Obstructive sleep apnea (adult) (pediatric): Secondary | ICD-10-CM | POA: Diagnosis not present

## 2023-12-07 ENCOUNTER — Ambulatory Visit: Admitting: Radiation Oncology

## 2023-12-14 ENCOUNTER — Ambulatory Visit
Admission: RE | Admit: 2023-12-14 | Discharge: 2023-12-14 | Disposition: A | Discharge status: Home / Self Care | Source: Ambulatory Visit | Attending: Radiation Oncology | Admitting: Radiation Oncology

## 2023-12-14 VITALS — BP 120/76 | HR 55 | Temp 97.8°F | Resp 18 | Ht 70.0 in | Wt 298.0 lb

## 2023-12-14 DIAGNOSIS — M47816 Spondylosis without myelopathy or radiculopathy, lumbar region: Secondary | ICD-10-CM | POA: Diagnosis not present

## 2023-12-14 DIAGNOSIS — C61 Malignant neoplasm of prostate: Secondary | ICD-10-CM | POA: Insufficient documentation

## 2023-12-14 NOTE — Progress Notes (Signed)
 Radiation Oncology Follow up Note  Name: Rodney Clark   Date:   12/14/2023 MRN:  [id number] DOB: Jul 06, 1955    This 68 y.o. male presents to the clinic today for 1 month follow-up status post image guided IMRT radiation therapy for stage IIc (cT1 cN0 M0) Gleason 8 (4+4) adenocarcinoma the prostate presenting with a PSA of 11.4.  REFERRING PROVIDER: Fernande Ophelia JINNY DOUGLAS, MD  HPI: Patient is a 68 year old male now out 1 month having completed image guided IMRT radiation therapy to his prostate.  He is seen today in routine follow-up and is doing well..  He specifically denies any increased lower urinary tract symptoms diarrhea or fatigue.  COMPLICATIONS OF TREATMENT: none  FOLLOW UP COMPLIANCE: keeps appointments   PHYSICAL EXAM:  BP 120/76   Pulse (!) 55   Temp 97.8 F (36.6 C)   Resp 18   Ht 5' 10 (1.778 m)   Wt 298 lb (135.2 kg)   BMI 42.76 kg/m  Well-developed well-nourished patient in NAD. HEENT reveals PERLA, EOMI, discs not visualized.  Oral cavity is clear. No oral mucosal lesions are identified. Neck is clear without evidence of cervical or supraclavicular adenopathy. Lungs are clear to A&P. Cardiac examination is essentially unremarkable with regular rate and rhythm without murmur rub or thrill. Abdomen is benign with no organomegaly or masses noted. Motor sensory and DTR levels are equal and symmetric in the upper and lower extremities. Cranial nerves II through XII are grossly intact. Proprioception is intact. No peripheral adenopathy or edema is identified. No motor or sensory levels are noted. Crude visual fields are within normal range.  RADIOLOGY RESULTS: No current films for review  PLAN: Present time patient is doing well with low side effect profile from radiation therapy.  I am pleased with his overall progress.  I have asked to see him back in 3 months with a PSA at that time.  Patient knows to call with any concerns.  I would like to take this opportunity to  thank you for allowing me to participate in the care of your patient.SABRA Marcey Penton, MD

## 2023-12-22 DIAGNOSIS — M47816 Spondylosis without myelopathy or radiculopathy, lumbar region: Secondary | ICD-10-CM | POA: Diagnosis not present

## 2024-01-02 ENCOUNTER — Emergency Department

## 2024-01-02 ENCOUNTER — Other Ambulatory Visit: Payer: Self-pay

## 2024-01-02 ENCOUNTER — Emergency Department
Admission: EM | Admit: 2024-01-02 | Discharge: 2024-01-02 | Disposition: A | Discharge status: Home / Self Care | Attending: Emergency Medicine | Admitting: Emergency Medicine

## 2024-01-02 DIAGNOSIS — M25512 Pain in left shoulder: Secondary | ICD-10-CM | POA: Diagnosis not present

## 2024-01-02 DIAGNOSIS — I10 Essential (primary) hypertension: Secondary | ICD-10-CM | POA: Diagnosis not present

## 2024-01-02 DIAGNOSIS — W07XXXA Fall from chair, initial encounter: Secondary | ICD-10-CM | POA: Diagnosis not present

## 2024-01-02 DIAGNOSIS — S46012A Strain of muscle(s) and tendon(s) of the rotator cuff of left shoulder, initial encounter: Secondary | ICD-10-CM | POA: Diagnosis not present

## 2024-01-02 DIAGNOSIS — R079 Chest pain, unspecified: Secondary | ICD-10-CM | POA: Diagnosis not present

## 2024-01-02 LAB — COMPREHENSIVE METABOLIC PANEL WITH GFR
ALT: 30 U/L (ref 0–44)
AST: 35 U/L (ref 15–41)
Albumin: 3.5 g/dL (ref 3.5–5.0)
Alkaline Phosphatase: 45 U/L (ref 38–126)
Anion gap: 11 (ref 5–15)
BUN: 12 mg/dL (ref 8–23)
CO2: 24 mmol/L (ref 22–32)
Calcium: 9.1 mg/dL (ref 8.9–10.3)
Chloride: 102 mmol/L (ref 98–111)
Creatinine, Ser: 0.92 mg/dL (ref 0.61–1.24)
GFR, Estimated: >60 mL/min (ref >60)
Glucose, Bld: 137 mg/dL — ABNORMAL HIGH (ref 70–99)
Potassium: 3.2 mmol/L — ABNORMAL LOW (ref 3.5–5.1)
Sodium: 137 mmol/L (ref 135–145)
Total Bilirubin: 0.7 mg/dL (ref 0.0–1.2)
Total Protein: 7.1 g/dL (ref 6.5–8.1)

## 2024-01-02 LAB — CBC WITH DIFFERENTIAL/PLATELET
Abs Immature Granulocytes: 0.06 K/uL (ref 0.00–0.07)
Basophils Absolute: 0.1 K/uL (ref 0.0–0.1)
Basophils Relative: 1 %
Eosinophils Absolute: 0.4 K/uL (ref 0.0–0.5)
Eosinophils Relative: 5 %
HCT: 41.3 % (ref 39.0–52.0)
Hemoglobin: 14.1 g/dL (ref 13.0–17.0)
Immature Granulocytes: 1 %
Lymphocytes Relative: 18 %
Lymphs Abs: 1.3 K/uL (ref 0.7–4.0)
MCH: 30.5 pg (ref 26.0–34.0)
MCHC: 34.1 g/dL (ref 30.0–36.0)
MCV: 89.2 fL (ref 80.0–100.0)
Monocytes Absolute: 1.1 K/uL — ABNORMAL HIGH (ref 0.1–1.0)
Monocytes Relative: 14 %
Neutro Abs: 4.6 K/uL (ref 1.7–7.7)
Neutrophils Relative %: 61 %
Platelets: 307 K/uL (ref 150–400)
RBC: 4.63 MIL/uL (ref 4.22–5.81)
RDW: 12.5 % (ref 11.5–15.5)
WBC: 7.4 K/uL (ref 4.0–10.5)
nRBC: 0 % (ref 0.0–0.2)

## 2024-01-02 LAB — TROPONIN I (HIGH SENSITIVITY)
Troponin I (High Sensitivity): 5 ng/L (ref <18)
Troponin I (High Sensitivity): 6 ng/L (ref <18)

## 2024-01-02 LAB — LIPASE, BLOOD: Lipase: 57 U/L — ABNORMAL HIGH (ref 11–51)

## 2024-01-02 MED ORDER — MORPHINE SULFATE (PF) 4 MG/ML IV SOLN
4.0000 mg | Freq: Once | INTRAVENOUS | Status: AC
  Administered 2024-01-02: 4 mg via INTRAVENOUS
  Filled 2024-01-02: qty 1

## 2024-01-02 MED ORDER — ONDANSETRON HCL 4 MG/2ML IJ SOLN
4.0000 mg | INTRAMUSCULAR | Status: AC
  Administered 2024-01-02: 4 mg via INTRAVENOUS
  Filled 2024-01-02: qty 2

## 2024-01-02 MED ORDER — OXYCODONE HCL 5 MG PO TABS
5.0000 mg | ORAL_TABLET | Freq: Once | ORAL | Status: AC
  Administered 2024-01-02: 5 mg via ORAL
  Filled 2024-01-02: qty 1

## 2024-01-02 MED ORDER — KETOROLAC TROMETHAMINE 30 MG/ML IJ SOLN
15.0000 mg | Freq: Once | INTRAMUSCULAR | Status: AC
  Administered 2024-01-02: 15 mg via INTRAVENOUS
  Filled 2024-01-02: qty 1

## 2024-01-02 NOTE — ED Triage Notes (Addendum)
 Pt presented to ED with c/o left shoulder pain starting at 1900 while eating dinner. Denies injury or trauma to left shoulder but does state that he fell on Thursday- denies hitting shoulder. States took prescribed Zanaflex  and Tramadol  without relief of symptoms. States pain has worsened since waking up at 0245. Pain 9/10. Pt also endorses nausea.

## 2024-01-02 NOTE — ED Notes (Signed)
 Pt given a phone and called his ride. Pt waiting on his ride at this time.

## 2024-01-02 NOTE — ED Provider Notes (Signed)
 [facility] Provider Note    Event Date/Time   First MD Initiated Contact with Patient 01/02/24 0403     (approximate)   History   Shoulder Pain   HPI Rodney Clark is a 68 y.o. male whose medical history includes but is not limited to cervical spondylosis with myelopathy and radiculopathy, surveillance for thoracic aortic aneurysm, obesity, hypertension, and polyradiculitis.  He presents for evaluation of acute onset left shoulder pain.  He says that it started around 7 PM while he was eating dinner.  It was relatively mild at first but it gradually got worse and worse.  He states that he has not had any injury that he can think of.  He had a fall after falling asleep in his chair a couple of days ago but he had no pain for more than 24 hours until it started suddenly this evening.  It is on the top middle part of his shoulder.  He has normal range of motion of his arm but it hurts to do so.  He went to sleep after taking some Tylenol  but woke up with it hurting more.  He has had a little bit of nausea but no vomiting.  No chest pain or shortness of breath.  No abdominal pain.  He has no weakness in his arms or his legs, just the sharp stabbing pain that feels like a knife in the top middle of his left shoulder.     Physical Exam   Triage Vital Signs: ED Triage Vitals  Encounter Vitals Group     BP 01/02/24 0354 126/80     Girls Systolic BP Percentile --      Girls Diastolic BP Percentile --      Boys Systolic BP Percentile --      Boys Diastolic BP Percentile --      Pulse Rate 01/02/24 0354 94     Resp 01/02/24 0354 17     Temp 01/02/24 0354 99.1 F (37.3 C)     Temp Source 01/02/24 0354 Oral     SpO2 01/02/24 0354 95 %     Weight 01/02/24 0354 135.2 kg (298 lb)     Height 01/02/24 0354 1.778 m (5' 10)     Head Circumference --      Peak Flow --      Pain Score 01/02/24 0352 9     Pain Loc --      Pain Education --      Exclude from Growth  Chart --     Most recent vital signs: Vitals:   01/02/24 0354  BP: 126/80  Pulse: 94  Resp: 17  Temp: 99.1 F (37.3 C)  SpO2: 95%    General: Awake, alert, conversant, but appears very uncomfortable. CV:  Good peripheral perfusion.  Regular rate and rhythm, normal heart sounds. Resp:  Normal effort. Speaking easily and comfortably, no accessory muscle usage nor intercostal retractions.  Lungs are clear to auscultation. Abd:  Obese with protuberant abdomen but no pulsatile abdominal masses and no tenderness to palpation. Other:  Patient is holding his left arm in a normal position.  He has exquisite and easily reproducible tenderness to palpation all along the superior aspect of the left shoulder but not including the clavicle.  He has pain with attempted range of motion both passive and active of the shoulder.  He has an easily palpable radial pulse and his skin has normal color and he has normal capillary refill.  There is no visible edema of the arm.  He has good grip strength.   ED Results / Procedures / Treatments   Labs (all labs ordered are listed, but only abnormal results are displayed) Labs Reviewed  CBC WITH DIFFERENTIAL/PLATELET - Abnormal; Notable for the following components:      Result Value   Monocytes Absolute 1.1 (*)    All other components within normal limits  LIPASE, BLOOD - Abnormal; Notable for the following components:   Lipase 57 (*)    All other components within normal limits  COMPREHENSIVE METABOLIC PANEL WITH GFR - Abnormal; Notable for the following components:   Potassium 3.2 (*)    Glucose, Bld 137 (*)    All other components within normal limits  TROPONIN I (HIGH SENSITIVITY)  TROPONIN I (HIGH SENSITIVITY)     EKG  ED ECG REPORT I, Darleene Dome, the attending physician, personally viewed and interpreted this ECG.  Date: 01/02/2024 EKG Time: 3:53 AM Rate: 96 Rhythm: normal sinus rhythm QRS Axis: Left axis deviation Intervals:  normal ST/T Wave abnormalities: Non-specific ST segment / T-wave changes, but no clear evidence of acute ischemia.  Specifically the patient has some ST segment elevation but it seems isolated to lead aVL and does not meet STEMI criteria Narrative Interpretation: no definitive evidence of acute ischemia; does not meet STEMI criteria.    RADIOLOGY See ED course for details   PROCEDURES:  Critical Care performed: No  .1-3 Lead EKG Interpretation  Performed by: Dome Darleene, MD Authorized by: Dome Darleene, MD     Interpretation: normal     ECG rate:  90   ECG rate assessment: normal     Rhythm: sinus rhythm     Ectopy: none     Conduction: normal       IMPRESSION / MDM / ASSESSMENT AND PLAN / ED COURSE  I reviewed the triage vital signs and the nursing notes.                              Differential diagnosis includes, but is not limited to, nonspecific musculoskeletal injury, fracture, dislocation, gout, AAS, ACS, nonspecific radiculopathy.  Patient's presentation is most consistent with acute presentation with potential threat to life or bodily function.  Labs/studies ordered: CMP, high-sensitivity troponin, CBC with differential, lipase, left shoulder x-ray, chest x-ray, EKG  Interventions/Medications given:  Medications  morphine  (PF) 4 MG/ML injection 4 mg (4 mg Intravenous Given 01/02/24 0438)  ketorolac  (TORADOL ) 30 MG/ML injection 15 mg (15 mg Intravenous Given 01/02/24 0438)  ondansetron  (ZOFRAN ) injection 4 mg (4 mg Intravenous Given 01/02/24 0437)    (Note:  hospital course my include additional interventions and/or labs/studies not listed above.)   Initially I had some concern about the possibility of ACS because the EKG looks a little bit different today with some very mild ST segment elevation in lead aVL.  However, the patient's high-sensitivity troponin is normal and the physical exam highly suggest a musculoskeletal cause given the exquisite tenderness  to palpation of the top of the shoulder.  There is no visible deformity and this does not seem to be a vascular issue.  It is also not consistent with a radiculopathy although that is possible.  I am treating with IV morphine  4 mg, Toradol  15 mg IV, and Zofran  4 mg IV, and we will proceed with a general workup as well as shoulder x-rays and a chest x-ray.  The patient  is on the cardiac monitor to evaluate for evidence of arrhythmia and/or significant heart rate changes.   Clinical Course as of 01/02/24 0707  Sat Jan 02, 2024  0600 Lipase(!): 57 Very minimally elevated lipase, likely not clinically significant given the patient's lack of abdominal symptoms. [CF]  [id number] Troponin I (High Sensitivity): 6 Normal repeat troponin [CF]  0706 Patient feels much better after sling placement.  Awaiting results of CT scan.  Transferring ED care to Dr. Ernest.  Anticipate discharge with outpatient follow-up with orthopedics.  Patient is comfortable with that plan barring any acute abnormalities on CT that need emergent intervention.  The patient's medical screening exam is reassuring with no indication of an emergent medical condition requiring hospitalization or additional evaluation at this point.  The patient is safe and appropriate for discharge and outpatient follow up assuming reassuring scan. [CF]    Clinical Course User Index [CF] Gordan Huxley, MD     FINAL CLINICAL IMPRESSION(S) / ED DIAGNOSES   Final diagnoses:  Acute pain of left shoulder     Rx / DC Orders   ED Discharge Orders     None        Note:  This document was prepared using Dragon voice recognition software and may include unintentional dictation errors.   Gordan Huxley, MD 01/02/24 [phone]

## 2024-01-02 NOTE — Discharge Instructions (Addendum)
 The cause of the pain in your shoulder is unclear, but it seems to be musculoskeletal.  We recommend you use the sling for comfort to take the pressure off of your shoulder and call the office of Dr. Edie to schedule a follow-up appointment with him or one of his colleagues at the next available opportunity.  Please continue taking your regular medications and use over-the-counter ibuprofen and/or Tylenol  according to label instructions as needed for the pain.  Take Tramadol  as prescribed for severe pain. Do not drink alcohol , drive or participate in any other potentially dangerous activities while taking this medication as it may make you sleepy. Do not take this medication with any other sedating medications, either prescription or over-the-counter. If you were prescribed Percocet or Vicodin, do not take these with acetaminophen  (Tylenol ) as it is already contained within these medications.   This medication is an opiate (or narcotic) pain medication and can be habit forming.  Use it as little as possible to achieve adequate pain control.  Do not use or use it with extreme caution if you have a history of opiate abuse or dependence.  If you are on a pain contract with your primary care doctor or a pain specialist, be sure to let them know you were prescribed this medication today from the [facility].  This medication is intended for your use only - do not give any to anyone else and keep it in a secure place where nobody else, especially children, have access to it.  It will also cause or worsen constipation, so you may want to consider taking an over-the-counter stool softener while you are taking this medication.    Return to the emergency department if you develop new or worsening symptoms that concern you.   1. No evidence of fracture or dislocation.  2. High riding humeral head with near abutment with the acromial  undersurface, findings likely due to a chronic  degenerative rotator  cuff tear and chronic rotator cuff arthropathy.  3. High-grade through and through tearing in the supraspinatus mid  to posterior tendon fibers and conjoined tendon complex, although a  portion of the more anterior supraspinatus insertion does remain.  4. Moderate atrophy in the supraspinatus, infraspinatus and teres  minor muscles.  5. The bicipital tendon is not well seen and there could be a  partial or complete intra-articular tear of it.  6. Calcific tendinopathy of the teres minor tendon.  7. Aortic and coronary artery atherosclerosis.    Aortic Atherosclerosis (ICD10-I70.0).

## 2024-01-02 NOTE — ED Provider Notes (Addendum)
 1. No evidence of fracture or dislocation.  2. High riding humeral head with near abutment with the acromial  undersurface, findings likely due to a chronic degenerative rotator  cuff tear and chronic rotator cuff arthropathy.  3. High-grade through and through tearing in the supraspinatus mid  to posterior tendon fibers and conjoined tendon complex, although a  portion of the more anterior supraspinatus insertion does remain.  4. Moderate atrophy in the supraspinatus, infraspinatus and teres  minor muscles.  5. The bicipital tendon is not well seen and there could be a  partial or complete intra-articular tear of it.  6. Calcific tendinopathy of the teres minor tendon.  7. Aortic and coronary artery atherosclerosis.    Aortic Atherosclerosis (ICD10-I70.0).    7:40 AM reevaluated patient.  Good distal pulse.  Good grip strength.  He reports symptoms are much improved with sitting in the sling.  Does report a little bit of increasing pain after medications have worn off.  Will give a dose of oxycodone .  He reports pain is much worse with certain movements.  This does seem more musculoskeletal in nature and patient has multiple areas of tearing and I did explain this to patient however thankfully no evidence of fracture or dislocation.  He feels comfortable with follow-up with orthopedics outpatient denies any chest pain.  And cardiac workup was reassuring   Pt did receive a 30 day prescription of tramadol  on 9/15    Ernest Ronal BRAVO, MD 01/02/24 9258    Ernest Ronal BRAVO, MD 01/02/24 [phone]

## 2024-01-02 NOTE — ED Notes (Signed)
 Pt was informed that he would need a ride home if he is given the ordered pain medication. He advised that he would call a ride.

## 2024-01-02 NOTE — ED Notes (Signed)
 Called [facility] spoke with Grisele to place pt on cardiac monitor.

## 2024-01-03 DIAGNOSIS — G4733 Obstructive sleep apnea (adult) (pediatric): Secondary | ICD-10-CM | POA: Diagnosis not present

## 2024-01-06 ENCOUNTER — Other Ambulatory Visit: Payer: Self-pay | Admitting: Surgery

## 2024-01-08 ENCOUNTER — Other Ambulatory Visit: Payer: Self-pay

## 2024-01-08 ENCOUNTER — Encounter
Admission: RE | Admit: 2024-01-08 | Discharge: 2024-01-08 | Disposition: A | Discharge status: Home / Self Care | Source: Ambulatory Visit | Attending: Surgery | Admitting: Surgery

## 2024-01-08 VITALS — BP 135/77 | Resp 14 | Ht 70.0 in | Wt 295.9 lb

## 2024-01-08 DIAGNOSIS — Z01812 Encounter for preprocedural laboratory examination: Secondary | ICD-10-CM | POA: Insufficient documentation

## 2024-01-08 DIAGNOSIS — Z01818 Encounter for other preprocedural examination: Secondary | ICD-10-CM | POA: Diagnosis present

## 2024-01-08 DIAGNOSIS — M1611 Unilateral primary osteoarthritis, right hip: Secondary | ICD-10-CM

## 2024-01-08 DIAGNOSIS — E876 Hypokalemia: Secondary | ICD-10-CM | POA: Insufficient documentation

## 2024-01-08 DIAGNOSIS — I10 Essential (primary) hypertension: Secondary | ICD-10-CM | POA: Diagnosis not present

## 2024-01-08 DIAGNOSIS — Z79899 Other long term (current) drug therapy: Secondary | ICD-10-CM

## 2024-01-08 HISTORY — DX: Unilateral primary osteoarthritis, right hip: M16.11

## 2024-01-08 HISTORY — DX: Cervical disc disorder, unspecified, unspecified cervical region: M50.90

## 2024-01-08 HISTORY — DX: Type 2 diabetes mellitus without complications: E11.9

## 2024-01-08 HISTORY — DX: Unspecified thoracic, thoracolumbar and lumbosacral intervertebral disc disorder: M51.9

## 2024-01-08 HISTORY — DX: Malignant neoplasm of prostate: C61

## 2024-01-08 HISTORY — DX: Obstructive sleep apnea (adult) (pediatric): G47.33

## 2024-01-08 HISTORY — DX: Atherosclerosis of aorta: I70.0

## 2024-01-08 LAB — BASIC METABOLIC PANEL WITH GFR
Anion gap: 12 (ref 5–15)
BUN: 17 mg/dL (ref 8–23)
CO2: 26 mmol/L (ref 22–32)
Calcium: 9.6 mg/dL (ref 8.9–10.3)
Chloride: 102 mmol/L (ref 98–111)
Creatinine, Ser: 0.78 mg/dL (ref 0.61–1.24)
GFR, Estimated: >60 mL/min (ref >60)
Glucose, Bld: 101 mg/dL — ABNORMAL HIGH (ref 70–99)
Potassium: 3.5 mmol/L (ref 3.5–5.1)
Sodium: 140 mmol/L (ref 135–145)

## 2024-01-08 LAB — URINALYSIS, ROUTINE W REFLEX MICROSCOPIC
Bilirubin Urine: NEGATIVE
Glucose, UA: NEGATIVE mg/dL
Hgb urine dipstick: NEGATIVE
Ketones, ur: NEGATIVE mg/dL
Leukocytes,Ua: NEGATIVE
Nitrite: NEGATIVE
Protein, ur: NEGATIVE mg/dL
Specific Gravity, Urine: 1.012 (ref 1.005–1.030)
pH: 6 (ref 5.0–8.0)

## 2024-01-08 LAB — SURGICAL PCR SCREEN
MRSA, PCR: NEGATIVE
Staphylococcus aureus: NEGATIVE

## 2024-01-08 LAB — TYPE AND SCREEN
ABO/RH(D): B POS
Antibody Screen: NEGATIVE

## 2024-01-08 NOTE — Patient Instructions (Addendum)
 Your procedure is scheduled on:01-21-24 Thursday Report to the Registration Desk on the 1st floor of the [facility]. To find out your arrival time, please call [phone] between 1PM - 3PM on:01-20-24 Wednesday If your arrival time is 6:00 am, do not arrive before that time as the [facility] entrance doors do not open until 6:00 am.  REMEMBER: Instructions that are not followed completely may result in serious medical risk, up to and including death; or upon the discretion of your surgeon and anesthesiologist your surgery may need to be rescheduled.  Do not eat food after midnight the night before surgery.  No gum chewing or hard candies.  You may however, drink Water up to 2 hours before you are scheduled to arrive for your surgery. Do not drink anything within 2 hours of your scheduled arrival time.  In addition, your doctor has ordered for you to drink the provided:  Gatorade G2 Drinking this carbohydrate drink up to two hours before surgery helps to reduce insulin resistance and improve patient outcomes. Please complete drinking 2 hours before scheduled arrival time.  One week prior to surgery:Last dose will be on 01-13-24  Stop Anti-inflammatories (NSAIDS) such as Advil, Aleve, Ibuprofen, Motrin, Naproxen, Naprosyn and Aspirin based products such as Excedrin, Goody's Powder, BC Powder. Stop ANY OVER THE COUNTER supplements until after surgery (Melatonin)  You may however, continue to take Tylenol /Tramadol  if needed for pain up until the day of surgery.  Stop your tirzepatide  (MOUNJARO ) 7 days prior to surgery-you may take last dose on 01-13-24 Wednesday. Do NOT take again until AFTER surgery  Continue taking all of your other prescription medications up until the day of surgery.  ON THE DAY OF SURGERY ONLY TAKE THESE MEDICATIONS WITH SIPS OF WATER: -famotidine  (PEPCID ) -fexofenadine (ALLEGRA)  -gabapentin  (NEURONTIN )   No Alcohol  for 24 hours before or after  surgery.  No Smoking including e-cigarettes for 24 hours before surgery.  No chewable tobacco products for at least 6 hours before surgery.  No nicotine patches on the day of surgery.  Do not use any recreational drugs for at least a week (preferably 2 weeks) before your surgery.  Please be advised that the combination of cocaine and anesthesia may have negative outcomes, up to and including death. If you test positive for cocaine, your surgery will be cancelled.  On the morning of surgery brush your teeth with toothpaste and water, you may rinse your mouth with mouthwash if you wish. Do not swallow any toothpaste or mouthwash.  Use CHG Soap as directed on instruction sheet.  Bring your C-Pap machine to the hospital  Do not wear jewelry, make-up, hairpins, clips or nail polish.  For welded (permanent) jewelry: bracelets, anklets, waist bands, etc.  Please have this removed prior to surgery.  If it is not removed, there is a chance that hospital personnel will need to cut it off on the day of surgery.  Do not wear lotions, powders, or perfumes.   Do not shave body hair from the neck down 48 hours before surgery.  Contact lenses, hearing aids and dentures may not be worn into surgery.  Do not bring valuables to the hospital. [facility] is not responsible for any missing/lost belongings or valuables.    Notify your doctor if there is any change in your medical condition (cold, fever, infection).  Wear comfortable clothing (specific to your surgery type) to the hospital.  After surgery, you can help prevent lung complications by doing breathing exercises.  Take deep breaths and cough every 1-2 hours. Your doctor may order a device called an Incentive Spirometer to help you take deep breaths. When coughing or sneezing, hold a pillow firmly against your incision with both hands. This is called "splinting." Doing this helps protect your incision. It also decreases belly  discomfort.  If you are being admitted to the hospital overnight, leave your suitcase in the car. After surgery it may be brought to your room.  In case of increased patient census, it may be necessary for you, the patient, to continue your postoperative care in the Same Day Surgery department.  If you are being discharged the day of surgery, you will not be allowed to drive home. You will need a responsible individual to drive you home and stay with you for 24 hours after surgery.   If you are taking public transportation, you will need to have a responsible individual with you.  Please call the Pre-admissions Testing Dept. at [phone] if you have any questions about these instructions.  Surgery Visitation Policy:  Patients having surgery or a procedure may have two visitors.  Children under the age of 15 must have an adult with them who is not the patient.  Inpatient Visitation:    Visiting hours are 7 a.m. to 8 p.m. Up to four visitors are allowed at one time in a patient room. The visitors may rotate out with other people during the day.  One visitor age 46 or older may stay with the patient overnight and must be in the room by 8 p.m.    Pre-operative 4 CHG Bath Instructions   You can play a key role in reducing the risk of infection after surgery. Your skin needs to be as free of germs as possible. You can reduce the number of germs on your skin by washing with CHG (chlorhexidine  gluconate) soap before surgery. CHG is an antiseptic soap that kills germs and continues to kill germs even after washing.   DO NOT use if you have an allergy to chlorhexidine /CHG or antibacterial soaps. If your skin becomes reddened or irritated, stop using the CHG and notify one of our RNs at [phone].   Please shower with the CHG soap starting 4 days before surgery using the following schedule:     Please keep in mind the following:  DO NOT shave, including legs and underarms, starting  the day of your first shower.   You may shave your face at any point before/day of surgery.  Place clean sheets on your bed the day you start using CHG soap. Use a clean washcloth (not used since being washed) for each shower. DO NOT sleep with pets once you start using the CHG.   CHG Shower Instructions:  If you choose to wash your hair and private area, wash first with your normal shampoo/soap.  After you use shampoo/soap, rinse your hair and body thoroughly to remove shampoo/soap residue.  Turn the water OFF and apply about 3 tablespoons (45 ml) of CHG soap to a CLEAN washcloth.  Apply CHG soap ONLY FROM YOUR NECK DOWN TO YOUR TOES (washing for 3-5 minutes)  DO NOT use CHG soap on face, private areas, open wounds, or sores.  Pay special attention to the area where your surgery is being performed.  If you are having back surgery, having someone wash your back for you may be helpful. Wait 2 minutes after CHG soap is applied, then you may rinse off the CHG soap.  Pat dry with a clean towel  Put on clean clothes/pajamas   If you choose to wear lotion, please use ONLY the CHG-compatible lotions on the back of this paper.     Additional instructions for the day of surgery: DO NOT APPLY any lotions, deodorants, cologne, or perfumes.   Put on clean/comfortable clothes.  Brush your teeth.  Ask your nurse before applying any prescription medications to the skin.      CHG Compatible Lotions   Aveeno Moisturizing lotion  Cetaphil Moisturizing Cream  Cetaphil Moisturizing Lotion  Clairol Herbal Essence Moisturizing Lotion, Dry Skin  Clairol Herbal Essence Moisturizing Lotion, Extra Dry Skin  Clairol Herbal Essence Moisturizing Lotion, Normal Skin  Curel Age Defying Therapeutic Moisturizing Lotion with Alpha Hydroxy  Curel Extreme Care Body Lotion  Curel Soothing Hands Moisturizing Hand Lotion  Curel Therapeutic Moisturizing Cream, Fragrance-Free  Curel Therapeutic Moisturizing Lotion,  Fragrance-Free  Curel Therapeutic Moisturizing Lotion, Original Formula  Eucerin Daily Replenishing Lotion  Eucerin Dry Skin Therapy Plus Alpha Hydroxy Crme  Eucerin Dry Skin Therapy Plus Alpha Hydroxy Lotion  Eucerin Original Crme  Eucerin Original Lotion  Eucerin Plus Crme Eucerin Plus Lotion  Eucerin TriLipid Replenishing Lotion  Keri Anti-Bacterial Hand Lotion  Keri Deep Conditioning Original Lotion Dry Skin Formula Softly Scented  Keri Deep Conditioning Original Lotion, Fragrance Free Sensitive Skin Formula  Keri Lotion Fast Absorbing Fragrance Free Sensitive Skin Formula  Keri Lotion Fast Absorbing Softly Scented Dry Skin Formula  Keri Original Lotion  Keri Skin Renewal Lotion Keri Silky Smooth Lotion  Keri Silky Smooth Sensitive Skin Lotion  Nivea Body Creamy Conditioning Oil  Nivea Body Extra Enriched Lotion  Nivea Body Original Lotion  Nivea Body Sheer Moisturizing Lotion Nivea Crme  Nivea Skin Firming Lotion  NutraDerm 30 Skin Lotion  NutraDerm Skin Lotion  NutraDerm Therapeutic Skin Cream  NutraDerm Therapeutic Skin Lotion  ProShield Protective Hand Cream  Provon moisturizing lotion  How to Use an Incentive Spirometer An incentive spirometer is a tool that measures how well you are filling your lungs with each breath. Learning to take long, deep breaths using this tool can help you keep your lungs clear and active. This may help to reverse or lessen your chance of developing breathing (pulmonary) problems, especially infection. You may be asked to use a spirometer: After a surgery. If you have a lung problem or a history of smoking. After a long period of time when you have been unable to move or be active. If the spirometer includes an indicator to show the highest number that you have reached, your health care provider or respiratory therapist will help you set a goal. Keep a log of your progress as told by your health care provider. What are the risks? Breathing  too quickly may cause dizziness or cause you to pass out. Take your time so you do not get dizzy or light-headed. If you are in pain, you may need to take pain medicine before doing incentive spirometry. It is harder to take a deep breath if you are having pain. How to use your incentive spirometer  Sit up on the edge of your bed or on a chair. Hold the incentive spirometer so that it is in an upright position. Before you use the spirometer, breathe out normally. Place the mouthpiece in your mouth. Make sure your lips are closed tightly around it. Breathe in slowly and as deeply as you can through your mouth, causing the piston or the ball to rise toward  the top of the chamber. Hold your breath for 3-5 seconds, or for as long as possible. If the spirometer includes a coach indicator, use this to guide you in breathing. Slow down your breathing if the indicator goes above the marked areas. Remove the mouthpiece from your mouth and breathe out normally. The piston or ball will return to the bottom of the chamber. Rest for a few seconds, then repeat the steps 10 or more times. Take your time and take a few normal breaths between deep breaths so that you do not get dizzy or light-headed. Do this every 1-2 hours when you are awake. If the spirometer includes a goal marker to show the highest number you have reached (best effort), use this as a goal to work toward during each repetition. After each set of 10 deep breaths, cough a few times. This will help to make sure that your lungs are clear. If you have an incision on your chest or abdomen from surgery, place a pillow or a rolled-up towel firmly against the incision when you cough. This can help to reduce pain while taking deep breaths and coughing. General tips When you are able to get out of bed: Walk around often. Continue to take deep breaths and cough in order to clear your lungs. Keep using the incentive spirometer until your health care  provider says it is okay to stop using it. If you have been in the hospital, you may be told to keep using the spirometer at home. Contact a health care provider if: You are having difficulty using the spirometer. You have trouble using the spirometer as often as instructed. Your pain medicine is not giving enough relief for you to use the spirometer as told. You have a fever. Get help right away if: You develop shortness of breath. You develop a cough with bloody mucus from the lungs. You have fluid or blood coming from an incision site after you cough. Summary An incentive spirometer is a tool that can help you learn to take long, deep breaths to keep your lungs clear and active. You may be asked to use a spirometer after a surgery, if you have a lung problem or a history of smoking, or if you have been inactive for a long period of time. Use your incentive spirometer as instructed every 1-2 hours while you are awake. If you have an incision on your chest or abdomen, place a pillow or a rolled-up towel firmly against your incision when you cough. This will help to reduce pain. Get help right away if you have shortness of breath, you cough up bloody mucus, or blood comes from your incision when you cough. This information is not intended to replace advice given to you by your health care provider. Make sure you discuss any questions you have with your health care provider. Document Revised: 01/23/2023 Document Reviewed: 01/23/2023 Elsevier Patient Education  2024 Elsevier Inc.    Preoperative Educational Videos for Total Hip, Knee and Shoulder Replacements  To better prepare for surgery, please view our videos that explain the physical activity and discharge planning required to have the best surgical recovery at [facility].  [email or web address]  Questions? Call [phone] or email  [email or web address]        Community Resource Directory to address health-related social needs:  [email or web address]

## 2024-01-12 DIAGNOSIS — M1611 Unilateral primary osteoarthritis, right hip: Secondary | ICD-10-CM | POA: Diagnosis not present

## 2024-01-12 DIAGNOSIS — M25551 Pain in right hip: Secondary | ICD-10-CM | POA: Diagnosis not present

## 2024-01-21 ENCOUNTER — Ambulatory Visit
Admission: RE | Admit: 2024-01-21 | Discharge: 2024-01-22 | Disposition: A | Discharge status: Home / Self Care | Source: Ambulatory Visit | Attending: Surgery | Admitting: Surgery

## 2024-01-21 ENCOUNTER — Encounter: Admission: RE | Disposition: A | Payer: Self-pay | Source: Ambulatory Visit | Attending: Surgery

## 2024-01-21 ENCOUNTER — Encounter: Payer: Self-pay | Admitting: Surgery

## 2024-01-21 ENCOUNTER — Ambulatory Visit

## 2024-01-21 ENCOUNTER — Other Ambulatory Visit: Payer: Self-pay

## 2024-01-21 ENCOUNTER — Ambulatory Visit: Payer: Self-pay | Admitting: Anesthesiology

## 2024-01-21 ENCOUNTER — Ambulatory Visit: Payer: Self-pay | Admitting: Urgent Care

## 2024-01-21 DIAGNOSIS — M4722 Other spondylosis with radiculopathy, cervical region: Secondary | ICD-10-CM | POA: Diagnosis not present

## 2024-01-21 DIAGNOSIS — R609 Edema, unspecified: Secondary | ICD-10-CM | POA: Diagnosis not present

## 2024-01-21 DIAGNOSIS — Z96641 Presence of right artificial hip joint: Secondary | ICD-10-CM | POA: Diagnosis not present

## 2024-01-21 DIAGNOSIS — Z6841 Body Mass Index (BMI) 40.0 and over, adult: Secondary | ICD-10-CM | POA: Insufficient documentation

## 2024-01-21 DIAGNOSIS — Z7985 Long-term (current) use of injectable non-insulin antidiabetic drugs: Secondary | ICD-10-CM | POA: Diagnosis not present

## 2024-01-21 DIAGNOSIS — E66813 Obesity, class 3: Secondary | ICD-10-CM | POA: Diagnosis not present

## 2024-01-21 DIAGNOSIS — Z79899 Other long term (current) drug therapy: Secondary | ICD-10-CM | POA: Insufficient documentation

## 2024-01-21 DIAGNOSIS — Z7901 Long term (current) use of anticoagulants: Secondary | ICD-10-CM | POA: Insufficient documentation

## 2024-01-21 DIAGNOSIS — M1611 Unilateral primary osteoarthritis, right hip: Secondary | ICD-10-CM | POA: Insufficient documentation

## 2024-01-21 DIAGNOSIS — G4730 Sleep apnea, unspecified: Secondary | ICD-10-CM | POA: Diagnosis not present

## 2024-01-21 DIAGNOSIS — E119 Type 2 diabetes mellitus without complications: Secondary | ICD-10-CM | POA: Diagnosis not present

## 2024-01-21 DIAGNOSIS — M4712 Other spondylosis with myelopathy, cervical region: Secondary | ICD-10-CM | POA: Insufficient documentation

## 2024-01-21 DIAGNOSIS — G4733 Obstructive sleep apnea (adult) (pediatric): Secondary | ICD-10-CM | POA: Diagnosis not present

## 2024-01-21 DIAGNOSIS — K219 Gastro-esophageal reflux disease without esophagitis: Secondary | ICD-10-CM | POA: Insufficient documentation

## 2024-01-21 DIAGNOSIS — I10 Essential (primary) hypertension: Secondary | ICD-10-CM | POA: Diagnosis not present

## 2024-01-21 DIAGNOSIS — M67951 Unspecified disorder of synovium and tendon, right thigh: Secondary | ICD-10-CM | POA: Diagnosis not present

## 2024-01-21 DIAGNOSIS — M461 Sacroiliitis, not elsewhere classified: Secondary | ICD-10-CM | POA: Insufficient documentation

## 2024-01-21 DIAGNOSIS — Z01812 Encounter for preprocedural laboratory examination: Secondary | ICD-10-CM

## 2024-01-21 HISTORY — PX: TOTAL HIP ARTHROPLASTY: SHX124

## 2024-01-21 LAB — GLUCOSE, CAPILLARY
Glucose-Capillary: 87 mg/dL (ref 70–99)
Glucose-Capillary: 130 mg/dL — ABNORMAL HIGH (ref 70–99)

## 2024-01-21 SURGERY — ARTHROPLASTY, HIP, TOTAL,POSTERIOR APPROACH
Anesthesia: Spinal | Site: Hip | Laterality: Right

## 2024-01-21 MED ORDER — TRANEXAMIC ACID-NACL 1000-0.7 MG/100ML-% IV SOLN
1000.0000 mg | INTRAVENOUS | Status: AC
  Administered 2024-01-21 (×2): 1000 mg via INTRAVENOUS

## 2024-01-21 MED ORDER — LOSARTAN POTASSIUM 50 MG PO TABS
50.0000 mg | ORAL_TABLET | ORAL | Status: DC
  Administered 2024-01-22: 50 mg via ORAL
  Filled 2024-01-21: qty 1

## 2024-01-21 MED ORDER — FAMOTIDINE 20 MG PO TABS
20.0000 mg | ORAL_TABLET | ORAL | Status: DC
  Administered 2024-01-22: 20 mg via ORAL
  Filled 2024-01-21: qty 1

## 2024-01-21 MED ORDER — BUPIVACAINE-EPINEPHRINE (PF) 0.5% -1:200000 IJ SOLN
INTRAMUSCULAR | Status: DC | PRN
  Administered 2024-01-21: 30 mL

## 2024-01-21 MED ORDER — OXYCODONE HCL 5 MG PO TABS
ORAL_TABLET | ORAL | Status: AC
  Filled 2024-01-21: qty 1

## 2024-01-21 MED ORDER — FLEET ENEMA RE ENEM: 1.0000 | ENEMA | Freq: Once | RECTAL | Status: DC | PRN

## 2024-01-21 MED ORDER — TRANEXAMIC ACID-NACL 1000-0.7 MG/100ML-% IV SOLN
INTRAVENOUS | Status: AC
Start: 2024-01-21 — End: 2024-01-21
  Filled 2024-01-21: qty 100

## 2024-01-21 MED ORDER — SODIUM CHLORIDE (PF) 0.9 % IJ SOLN
INTRAMUSCULAR | Status: AC
  Filled 2024-01-21: qty 40

## 2024-01-21 MED ORDER — PROPOFOL 500 MG/50ML IV EMUL
INTRAVENOUS | Status: DC | PRN
  Administered 2024-01-21: 100 ug/kg/min via INTRAVENOUS

## 2024-01-21 MED ORDER — PANTOPRAZOLE SODIUM 40 MG PO TBEC
40.0000 mg | DELAYED_RELEASE_TABLET | Freq: Every day | ORAL | Status: DC
  Administered 2024-01-21 – 2024-01-22 (×2): 40 mg via ORAL
  Filled 2024-01-21 (×2): qty 1

## 2024-01-21 MED ORDER — FENTANYL CITRATE (PF) 100 MCG/2ML IJ SOLN
INTRAMUSCULAR | Status: AC
  Filled 2024-01-21: qty 2

## 2024-01-21 MED ORDER — ACETAMINOPHEN 325 MG PO TABS: 325.0000 mg | ORAL_TABLET | Freq: Four times a day (QID) | ORAL | Status: DC | PRN

## 2024-01-21 MED ORDER — CEFAZOLIN SODIUM-DEXTROSE 2-4 GM/100ML-% IV SOLN
2.0000 g | INTRAVENOUS | Status: AC
  Administered 2024-01-21: 3 g via INTRAVENOUS

## 2024-01-21 MED ORDER — CHLORHEXIDINE GLUCONATE 0.12 % MT SOLN
15.0000 mL | Freq: Once | OROMUCOSAL | Status: AC
  Administered 2024-01-21: 15 mL via OROMUCOSAL

## 2024-01-21 MED ORDER — CEFAZOLIN SODIUM-DEXTROSE 3-4 GM/150ML-% IV SOLN
3.0000 g | Freq: Four times a day (QID) | INTRAVENOUS | Status: AC
  Administered 2024-01-21 (×2): 3 g via INTRAVENOUS
  Filled 2024-01-21 (×2): qty 150

## 2024-01-21 MED ORDER — KETOROLAC TROMETHAMINE 15 MG/ML IJ SOLN
15.0000 mg | Freq: Once | INTRAMUSCULAR | Status: AC
  Administered 2024-01-21: 15 mg via INTRAVENOUS

## 2024-01-21 MED ORDER — ONDANSETRON HCL 4 MG/2ML IJ SOLN
INTRAMUSCULAR | Status: DC | PRN
  Administered 2024-01-21: 4 mg via INTRAVENOUS

## 2024-01-21 MED ORDER — FENOFIBRATE 160 MG PO TABS
160.0000 mg | ORAL_TABLET | Freq: Every day | ORAL | Status: DC
  Administered 2024-01-21 – 2024-01-22 (×2): 160 mg via ORAL
  Filled 2024-01-21 (×3): qty 1

## 2024-01-21 MED ORDER — MAGNESIUM HYDROXIDE 400 MG/5ML PO SUSP: 30.0000 mL | Freq: Every day | ORAL | Status: DC | PRN

## 2024-01-21 MED ORDER — METOCLOPRAMIDE HCL 10 MG PO TABS: 5.0000 mg | ORAL_TABLET | Freq: Three times a day (TID) | ORAL | Status: DC | PRN

## 2024-01-21 MED ORDER — KETOROLAC TROMETHAMINE 30 MG/ML IJ SOLN
INTRAMUSCULAR | Status: DC | PRN
  Administered 2024-01-21: 30 mg via INTRAMUSCULAR

## 2024-01-21 MED ORDER — MIDAZOLAM HCL 5 MG/5ML IJ SOLN
INTRAMUSCULAR | Status: DC | PRN
  Administered 2024-01-21: 2 mg via INTRAVENOUS

## 2024-01-21 MED ORDER — SODIUM CHLORIDE 0.9 % IV SOLN: INTRAVENOUS | Status: DC

## 2024-01-21 MED ORDER — SODIUM CHLORIDE 0.9 % IR SOLN
Status: DC | PRN
  Administered 2024-01-21: 3000 mL

## 2024-01-21 MED ORDER — DIPHENHYDRAMINE HCL 12.5 MG/5ML PO ELIX: 12.5000 mg | ORAL_SOLUTION | ORAL | Status: DC | PRN

## 2024-01-21 MED ORDER — BUPIVACAINE-EPINEPHRINE (PF) 0.5% -1:200000 IJ SOLN
INTRAMUSCULAR | Status: AC
  Filled 2024-01-21: qty 30

## 2024-01-21 MED ORDER — PROPOFOL 10 MG/ML IV BOLUS
INTRAVENOUS | Status: AC
Start: 2024-01-21 — End: 2024-01-21
  Filled 2024-01-21: qty 20

## 2024-01-21 MED ORDER — BISACODYL 10 MG RE SUPP: 10.0000 mg | Freq: Every day | RECTAL | Status: DC | PRN

## 2024-01-21 MED ORDER — LIDOCAINE HCL (PF) 2 % IJ SOLN
INTRAMUSCULAR | Status: AC
  Filled 2024-01-21: qty 5

## 2024-01-21 MED ORDER — CEFAZOLIN SODIUM-DEXTROSE 2-4 GM/100ML-% IV SOLN
INTRAVENOUS | Status: AC
Start: 2024-01-21 — End: 2024-01-21
  Filled 2024-01-21: qty 100

## 2024-01-21 MED ORDER — PROPOFOL 10 MG/ML IV BOLUS
INTRAVENOUS | Status: DC | PRN
  Administered 2024-01-21: 50 mg via INTRAVENOUS

## 2024-01-21 MED ORDER — TRIAMCINOLONE ACETONIDE 40 MG/ML IJ SUSP
INTRAMUSCULAR | Status: DC | PRN
  Administered 2024-01-21: 80 mg

## 2024-01-21 MED ORDER — DOCUSATE SODIUM 100 MG PO CAPS
100.0000 mg | ORAL_CAPSULE | Freq: Two times a day (BID) | ORAL | Status: DC
  Administered 2024-01-21 – 2024-01-22 (×2): 100 mg via ORAL
  Filled 2024-01-21 (×2): qty 1

## 2024-01-21 MED ORDER — OXYCODONE HCL 5 MG PO TABS
5.0000 mg | ORAL_TABLET | Freq: Once | ORAL | Status: AC | PRN
  Administered 2024-01-21: 5 mg via ORAL

## 2024-01-21 MED ORDER — ONDANSETRON HCL 4 MG PO TABS: 4.0000 mg | ORAL_TABLET | Freq: Four times a day (QID) | ORAL | Status: DC | PRN

## 2024-01-21 MED ORDER — PROPOFOL 1000 MG/100ML IV EMUL
INTRAVENOUS | Status: AC
Start: 2024-01-21 — End: 2024-01-21
  Filled 2024-01-21: qty 100

## 2024-01-21 MED ORDER — TRIAMCINOLONE ACETONIDE 40 MG/ML IJ SUSP
INTRAMUSCULAR | Status: AC
Start: 2024-01-21 — End: 2024-01-21
  Filled 2024-01-21: qty 2

## 2024-01-21 MED ORDER — MIDAZOLAM HCL 2 MG/2ML IJ SOLN
INTRAMUSCULAR | Status: AC
  Filled 2024-01-21: qty 2

## 2024-01-21 MED ORDER — HYDROCHLOROTHIAZIDE 25 MG PO TABS
25.0000 mg | ORAL_TABLET | Freq: Every day | ORAL | Status: DC
Start: 2024-01-21 — End: 2024-01-22
  Administered 2024-01-21 – 2024-01-22 (×2): 25 mg via ORAL
  Filled 2024-01-21 (×2): qty 1

## 2024-01-21 MED ORDER — LIDOCAINE HCL (CARDIAC) PF 100 MG/5ML IV SOSY
PREFILLED_SYRINGE | INTRAVENOUS | Status: DC | PRN
Start: 2024-01-21 — End: 2024-01-21
  Administered 2024-01-21: 40 mg via INTRAVENOUS

## 2024-01-21 MED ORDER — HYDROMORPHONE HCL 1 MG/ML IJ SOLN: 0.2500 mg | INTRAMUSCULAR | Status: DC | PRN

## 2024-01-21 MED ORDER — FENTANYL CITRATE (PF) 100 MCG/2ML IJ SOLN
INTRAMUSCULAR | Status: DC | PRN
  Administered 2024-01-21 (×2): 50 ug via INTRAVENOUS

## 2024-01-21 MED ORDER — SODIUM CHLORIDE 0.9 % IV SOLN: INTRAVENOUS | Status: DC | PRN

## 2024-01-21 MED ORDER — FENTANYL CITRATE (PF) 100 MCG/2ML IJ SOLN
25.0000 ug | INTRAMUSCULAR | Status: DC | PRN
  Administered 2024-01-21 (×2): 25 ug via INTRAVENOUS

## 2024-01-21 MED ORDER — TIZANIDINE HCL 4 MG PO TABS
4.0000 mg | ORAL_TABLET | Freq: Every day | ORAL | Status: DC
  Administered 2024-01-21: 4 mg via ORAL
  Filled 2024-01-21: qty 1

## 2024-01-21 MED ORDER — BUPIVACAINE HCL (PF) 0.5 % IJ SOLN
INTRAMUSCULAR | Status: DC | PRN
  Administered 2024-01-21: 3 mL via INTRATHECAL

## 2024-01-21 MED ORDER — LORATADINE 10 MG PO TABS
10.0000 mg | ORAL_TABLET | Freq: Every day | ORAL | Status: DC
  Administered 2024-01-22: 10 mg via ORAL
  Filled 2024-01-21: qty 1

## 2024-01-21 MED ORDER — MELATONIN 5 MG PO TABS
10.0000 mg | ORAL_TABLET | Freq: Every day | ORAL | Status: DC
  Administered 2024-01-21: 10 mg via ORAL
  Filled 2024-01-21: qty 2

## 2024-01-21 MED ORDER — OXYCODONE HCL 5 MG/5ML PO SOLN: 5.0000 mg | Freq: Once | ORAL | Status: AC | PRN

## 2024-01-21 MED ORDER — TRAZODONE HCL 50 MG PO TABS
50.0000 mg | ORAL_TABLET | Freq: Every day | ORAL | Status: DC
  Administered 2024-01-21: 50 mg via ORAL
  Filled 2024-01-21: qty 1

## 2024-01-21 MED ORDER — CHLORHEXIDINE GLUCONATE 0.12 % MT SOLN
OROMUCOSAL | Status: AC
Start: 2024-01-21 — End: 2024-01-21
  Filled 2024-01-21: qty 15

## 2024-01-21 MED ORDER — BUPIVACAINE LIPOSOME 1.3 % IJ SUSP
INTRAMUSCULAR | Status: AC
  Filled 2024-01-21: qty 20

## 2024-01-21 MED ORDER — ONDANSETRON HCL 4 MG/2ML IJ SOLN: 4.0000 mg | Freq: Four times a day (QID) | INTRAMUSCULAR | Status: DC | PRN

## 2024-01-21 MED ORDER — METOCLOPRAMIDE HCL 5 MG/ML IJ SOLN: 5.0000 mg | Freq: Three times a day (TID) | INTRAMUSCULAR | Status: DC | PRN

## 2024-01-21 MED ORDER — ACETAMINOPHEN 10 MG/ML IV SOLN
INTRAVENOUS | Status: AC
  Filled 2024-01-21: qty 100

## 2024-01-21 MED ORDER — KETOROLAC TROMETHAMINE 15 MG/ML IJ SOLN
7.5000 mg | Freq: Four times a day (QID) | INTRAMUSCULAR | Status: DC
  Administered 2024-01-21 – 2024-01-22 (×3): 7.5 mg via INTRAVENOUS
  Filled 2024-01-21 (×3): qty 1

## 2024-01-21 MED ORDER — BUPIVACAINE HCL (PF) 0.5 % IJ SOLN
INTRAMUSCULAR | Status: AC
  Filled 2024-01-21: qty 10

## 2024-01-21 MED ORDER — 0.9 % SODIUM CHLORIDE (POUR BTL) OPTIME
TOPICAL | Status: DC | PRN
  Administered 2024-01-21: 1000 mL

## 2024-01-21 MED ORDER — PRAVASTATIN SODIUM 20 MG PO TABS
10.0000 mg | ORAL_TABLET | Freq: Every day | ORAL | Status: DC
  Administered 2024-01-21 – 2024-01-22 (×2): 10 mg via ORAL
  Filled 2024-01-21 (×2): qty 1

## 2024-01-21 MED ORDER — OXYCODONE HCL 5 MG PO TABS
5.0000 mg | ORAL_TABLET | ORAL | Status: DC | PRN
  Administered 2024-01-21 – 2024-01-22 (×3): 10 mg via ORAL
  Filled 2024-01-21 (×3): qty 2

## 2024-01-21 MED ORDER — ACETAMINOPHEN 10 MG/ML IV SOLN
INTRAVENOUS | Status: DC | PRN
  Administered 2024-01-21: 1000 mg via INTRAVENOUS

## 2024-01-21 MED ORDER — DEXMEDETOMIDINE HCL IN NACL 80 MCG/20ML IV SOLN
INTRAVENOUS | Status: DC | PRN
  Administered 2024-01-21 (×2): 8 ug via INTRAVENOUS

## 2024-01-21 MED ORDER — ORAL CARE MOUTH RINSE: 15.0000 mL | Freq: Once | OROMUCOSAL | Status: AC

## 2024-01-21 MED ORDER — GABAPENTIN 100 MG PO CAPS
300.0000 mg | ORAL_CAPSULE | Freq: Every day | ORAL | Status: DC
  Administered 2024-01-21 – 2024-01-22 (×4): 300 mg via ORAL
  Filled 2024-01-21 (×2): qty 3

## 2024-01-21 MED ORDER — APIXABAN 2.5 MG PO TABS
2.5000 mg | ORAL_TABLET | Freq: Two times a day (BID) | ORAL | Status: DC
  Administered 2024-01-22: 2.5 mg via ORAL
  Filled 2024-01-21: qty 1

## 2024-01-21 MED ORDER — KETOROLAC TROMETHAMINE 15 MG/ML IJ SOLN
INTRAMUSCULAR | Status: AC
  Filled 2024-01-21: qty 1

## 2024-01-21 MED ORDER — ACETAMINOPHEN 500 MG PO TABS
1000.0000 mg | ORAL_TABLET | Freq: Four times a day (QID) | ORAL | Status: DC
  Administered 2024-01-21 – 2024-01-22 (×3): 1000 mg via ORAL
  Filled 2024-01-21 (×4): qty 2

## 2024-01-21 MED ORDER — ONDANSETRON HCL 4 MG/2ML IJ SOLN
INTRAMUSCULAR | Status: AC
  Filled 2024-01-21: qty 2

## 2024-01-21 SURGICAL SUPPLY — 51 items
BIT DRILL JC 5IN 2.4M 127 24FL (BIT) IMPLANT
BLADE SAGITTAL WIDE XTHICK NO (BLADE) ×1 IMPLANT
BLADE SURG SZ20 CARB STEEL (BLADE) ×1 IMPLANT
CHLORAPREP W/TINT 26 (MISCELLANEOUS) ×1 IMPLANT
DRAPE IMP U-DRAPE 54X76 (DRAPES) IMPLANT
DRAPE INCISE IOBAN 66X60 STRL (DRAPES) ×1 IMPLANT
DRAPE SHEET LG 3/4 BI-LAMINATE (DRAPES) ×1 IMPLANT
DRAPE SURG 17X11 SM STRL (DRAPES) ×2 IMPLANT
DRSG MEPILEX SACRM 8.7X9.8 (GAUZE/BANDAGES/DRESSINGS) ×1 IMPLANT
DRSG OPSITE POSTOP 4X10 (GAUZE/BANDAGES/DRESSINGS) ×1 IMPLANT
ELECT BLADE 6.5 EXT (BLADE) IMPLANT
ELECT CAUTERY BLADE 6.4 (BLADE) ×1 IMPLANT
GAUZE 4X4 16PLY ~~LOC~~+RFID DBL (SPONGE) ×1 IMPLANT
GAUZE XEROFORM 1X8 LF (GAUZE/BANDAGES/DRESSINGS) ×1 IMPLANT
GLOVE BIO SURGEON STRL SZ7.5 (GLOVE) ×4 IMPLANT
GLOVE BIO SURGEON STRL SZ8 (GLOVE) ×4 IMPLANT
GLOVE BIOGEL PI IND STRL 8 (GLOVE) ×1 IMPLANT
GLOVE INDICATOR 8.0 STRL GRN (GLOVE) ×1 IMPLANT
GOWN STRL REUS W/ TWL LRG LVL3 (GOWN DISPOSABLE) ×1 IMPLANT
GOWN STRL REUS W/ TWL XL LVL3 (GOWN DISPOSABLE) ×1 IMPLANT
HANDLE YANKAUER SUCT OPEN TIP (MISCELLANEOUS) IMPLANT
HEAD CERAMIC BIOLOX 36 T1 STD (Head) IMPLANT
HOLSTER ELECTROSUGICAL PENCIL (MISCELLANEOUS) ×1 IMPLANT
HOOD PEEL AWAY T7 (MISCELLANEOUS) ×3 IMPLANT
KIT TURNOVER KIT A (KITS) ×1 IMPLANT
LINER ACE G7 36 SZ G HIGH WALL (Liner) IMPLANT
MANIFOLD NEPTUNE II (INSTRUMENTS) ×1 IMPLANT
NDL FILTER BLUNT 18X1 1/2 (NEEDLE) ×1 IMPLANT
NDL SAFETY ECLIP 18X1.5 (MISCELLANEOUS) ×2 IMPLANT
NDL SPNL 20GX3.5 QUINCKE YW (NEEDLE) ×1 IMPLANT
NEEDLE FILTER BLUNT 18X1 1/2 (NEEDLE) ×1 IMPLANT
NEEDLE SPNL 20GX3.5 QUINCKE YW (NEEDLE) ×1 IMPLANT
PACK HIP PROSTHESIS (MISCELLANEOUS) ×1 IMPLANT
PENCIL SMOKE EVACUATOR (MISCELLANEOUS) ×1 IMPLANT
PIN STEIN SMOOTH 3/16X9 (Pin) ×1 IMPLANT
SHELL ACETABULAR 3H 58MM G7 (Hips) IMPLANT
SOL .9 NS 3000ML IRR UROMATIC (IV SOLUTION) ×1 IMPLANT
SOLN STERILE WATER BTL 1000 ML (IV SOLUTION) ×1 IMPLANT
SPONGE T-LAP 18X18 ~~LOC~~+RFID (SPONGE) IMPLANT
STAPLER SKIN PROX 35W (STAPLE) ×1 IMPLANT
STEM COLLARLESS FULL 11X135 (Stem) IMPLANT
SUT TICRON 2-0 30IN 311381 (SUTURE) ×3 IMPLANT
SUT VIC AB 0 CT1 36 (SUTURE) ×1 IMPLANT
SUT VIC AB 1 CT1 36 (SUTURE) ×1 IMPLANT
SUT VIC AB 2-0 CT1 (SUTURE) ×3 IMPLANT
SUT VIC AB 2-0 CT1 TAPERPNT 27 (SUTURE) IMPLANT
SYR 10ML LL (SYRINGE) ×1 IMPLANT
SYR 20ML LL LF (SYRINGE) ×1 IMPLANT
SYR 30ML LL (SYRINGE) ×2 IMPLANT
TIP FAN IRRIG PULSAVAC PLUS (DISPOSABLE) ×1 IMPLANT
TRAP FLUID SMOKE EVACUATOR (MISCELLANEOUS) ×1 IMPLANT

## 2024-01-21 NOTE — Progress Notes (Signed)
  Chaplain On-Call responded to a referral from Overnight Chaplain Eleanor Geralds. The request was to visit the patient today as the patient will have hip replacement surgery.  Chaplain visited the Same Day Surgery Unit at 1430, and learned that the patient is in the Recovery Room. No family is present.  Chaplain will refer to the Evening Chaplain for follow-up.  Chaplain Bebe Ardean EMERSON Hershal., [facility]

## 2024-01-21 NOTE — Evaluation (Signed)
 Physical Therapy Evaluation Patient Details Name: Rodney Clark MRN: [id number] DOB: 01-12-1956 Today's Date: 01/21/2024  History of Present Illness  Pt is a 68 y/o M s/p R THA posterior approach on 01/21/24. PMH significant for AAA 4.1cm w/o rupture, chest pain, GERD, gout, HLD, HTN, T2DM.  Clinical Impression  Pt A&Ox4, pleasant and agreeable to PT evaluation. At baseline, pt is modI with mobility, cites intermittent SPC use for amb, IND with ADLs. Pt was received in bed, performed supine > sit transfer with CGA at BLE to prevent R hip adduction during transfer. Pt performed STS from elevated EOB with CGA, mod VC for hand placement on RW and limiting trunk flexion during transfers. Pt able to amb ~61ft in room with CGA, step-to pattern throughout with decreased stance time on RLE, no LOB or buckling. Extensive education and demonstration provided on technique for completing turns to R to prevent R hip internal rotation. HR 60bpm SpO2 96% on RA after bout of ambulation and seated rest. Pt was left seated in recliner at end of session, all needs in reach, spouse present in room. Pt would benefit from skilled PT intervention to address listed deficits (see PT Problem List) and improve independence with functional mobility/ADLs.      If plan is discharge home, recommend the following: A little help with walking and/or transfers;A little help with bathing/dressing/bathroom;Assist for transportation;Help with stairs or ramp for entrance   Can travel by private vehicle        Equipment Recommendations BSC/3in1  Recommendations for Other Services       Functional Status Assessment Patient has had a recent decline in their functional status and demonstrates the ability to make significant improvements in function in a reasonable and predictable amount of time.     Precautions / Restrictions Precautions Precautions: Posterior Hip;Fall Precaution Booklet Issued: Yes (comment) Recall of  Precautions/Restrictions: Intact Restrictions Weight Bearing Restrictions Per Provider Order: Yes RLE Weight Bearing Per Provider Order: Weight bearing as tolerated      Mobility  Bed Mobility Overal bed mobility: Needs Assistance Bed Mobility: Supine to Sit     Supine to sit: Contact guard, HOB elevated     General bed mobility comments: CGA at BLE to prevent adduction. VC for sequencing and hand placement    Transfers Overall transfer level: Needs assistance Equipment used: Rolling walker (2 wheels) Transfers: Sit to/from Stand Sit to Stand: Contact guard assist, From elevated surface           General transfer comment: STS from elevated EOB with CGA, VC for hand placement and to limit trunk flexion during transfer    Ambulation/Gait Ambulation/Gait assistance: Contact guard assist Gait Distance (Feet): 20 Feet Assistive device: Rolling walker (2 wheels) Gait Pattern/deviations: Step-to pattern, Decreased stance time - right, Decreased weight shift to right, Wide base of support Gait velocity: decreased     General Gait Details: Pt amb short distance in/around room with step-to pattern, no LOB or buckling, heavy BUE support on RW. Pt cued for step-to sequencing to allow L foot to step equally to R. VC and demo for turning to R to prevent R hip internal rotation  Stairs            Wheelchair Mobility     Tilt Bed    Modified Rankin (Stroke Patients Only)       Balance Overall balance assessment: Needs assistance Sitting-balance support: Feet supported Sitting balance-Leahy Scale: Good Sitting balance - Comments: steady static and dynamic sitting  Standing balance support: Bilateral upper extremity supported, Reliant on assistive device for balance Standing balance-Leahy Scale: Fair Standing balance comment: heavy BUE support on RW                             Pertinent Vitals/Pain Pain Assessment Pain Assessment: Faces Faces Pain  Scale: Hurts little more Pain Location: R hip, with movement Pain Descriptors / Indicators: Grimacing, Operative site guarding Pain Intervention(s): Monitored during session, Repositioned    Home Living Family/patient expects to be discharged to:: Private residence Living Arrangements: Spouse/significant other Available Help at Discharge: Family;Available 24 hours/day Type of Home: House Home Access: Stairs to enter Entrance Stairs-Rails: Right;Left;Can reach both Entrance Stairs-Number of Steps: 2 Alternate Level Stairs-Number of Steps: flight Home Layout: Two level;Able to live on main level with bedroom/bathroom Home Equipment: Rolling Walker (2 wheels);Rollator (4 wheels);Cane - single point;Shower seat Additional Comments: entrance has 2 stairs with bilateral rails, has 2 more stairs from living room up to kitchen with no rails. Sleeps in recliner on 1st level    Prior Function Prior Level of Function : Independent/Modified Independent             Mobility Comments: Pt reports using a SPC PRN for the last few weeks, limited community ambulator, had a fall recently where he fell backward out of a computer chair ADLs Comments: IND with ADLs     Extremity/Trunk Assessment   Upper Extremity Assessment Upper Extremity Assessment: Overall WFL for tasks assessed    Lower Extremity Assessment Lower Extremity Assessment: Overall WFL for tasks assessed RLE Deficits / Details: able to perform SLR and LAQ, reports diminished sensation on top of R foot compared to L, hx of neuropathy       Communication   Communication Communication: No apparent difficulties    Cognition Arousal: Alert Behavior During Therapy: WFL for tasks assessed/performed   PT - Cognitive impairments: No apparent impairments                       PT - Cognition Comments: A&Ox4, pleasant and cooperative throughout Following commands: Intact       Cueing Cueing Techniques: Verbal cues,  Visual cues     General Comments General comments (skin integrity, edema, etc.): dressing over R hip dry and intact throughout session    Exercises Other Exercises Other Exercises: HR 60bpm SpO2 96% on RA after bout of amb   Assessment/Plan    PT Assessment Patient needs continued PT services  PT Problem List Decreased strength;Decreased activity tolerance;Decreased balance;Decreased mobility;Pain       PT Treatment Interventions DME instruction;Gait training;Stair training;Functional mobility training;Therapeutic activities;Therapeutic exercise;Balance training;Neuromuscular re-education;Patient/family education    PT Goals (Current goals can be found in the Care Plan section)  Acute Rehab PT Goals Patient Stated Goal: to go home PT Goal Formulation: With patient Time For Goal Achievement: 02/04/24 Potential to Achieve Goals: Good    Frequency BID     Co-evaluation               AM-PAC PT 6 Clicks Mobility  Outcome Measure Help needed turning from your back to your side while in a flat bed without using bedrails?: A Little Help needed moving from lying on your back to sitting on the side of a flat bed without using bedrails?: A Little Help needed moving to and from a bed to a chair (including a wheelchair)?: A Little Help needed  standing up from a chair using your arms (e.g., wheelchair or bedside chair)?: A Little Help needed to walk in hospital room?: A Little Help needed climbing 3-5 steps with a railing? : A Little 6 Click Score: 18    End of Session Equipment Utilized During Treatment: Gait belt Activity Tolerance: Patient tolerated treatment well Patient left: in chair;with call bell/phone within reach;with family/visitor present;with SCD's reapplied Nurse Communication: Mobility status PT Visit Diagnosis: Other abnormalities of gait and mobility (R26.89);Muscle weakness (generalized) (M62.81);Difficulty in walking, not elsewhere classified  (R26.2);Pain Pain - Right/Left: Right Pain - part of body: Hip    Time: 8377-8341 PT Time Calculation (min) (ACUTE ONLY): 36 min   Charges:   PT Evaluation $PT Eval Moderate Complexity: 1 Mod PT Treatments $Therapeutic Activity: 8-22 mins PT General Charges $$ ACUTE PT VISIT: 1 Visit         Janell Axe, SPT

## 2024-01-21 NOTE — Transfer of Care (Signed)
 Immediate Anesthesia Transfer of Care Note  Patient: Rodney Clark  Procedure(s) Performed: ARTHROPLASTY, HIP, TOTAL,POSTERIOR APPROACH (Right: Hip)  Patient Location: PACU  Anesthesia Type:General and Spinal  Level of Consciousness: awake, alert , oriented, and patient cooperative  Airway & Oxygen Therapy: Patient Spontanous Breathing and Patient connected to face mask oxygen  Post-op Assessment: Report given to RN, Post -op Vital signs reviewed and stable, and Patient moving all extremities X 4  Post vital signs: Reviewed and stable  Last Vitals:  Vitals Value Taken Time  BP 107/79 01/21/24 14:00  Temp 36.7 C 01/21/24 13:58  Pulse 77 01/21/24 14:00  Resp 20 01/21/24 14:00  SpO2 99 % 01/21/24 14:00  Vitals shown include unfiled device data.  Last Pain: There were no vitals filed for this visit.       Complications: No notable events documented.

## 2024-01-21 NOTE — Op Note (Signed)
 01/21/2024  1:55 PM  Patient:   Rodney Clark  Pre-Op Diagnosis:   Degenerative joint disease, right hip.  Post-Op Diagnosis:   Same.  Procedure:   Right total hip arthroplasty.  Surgeon:   DOROTHA Reyes Maltos, MD  Assistant:   Toribio Alas, RNFA; HILLARY Hummer, PA-S  Anesthesia:   Spinal  Findings:   As above.  Complications:   None  EBL:   200 cc  Fluids:   500 cc crystalloid  UOP:   None  TT:   None  Drains:   None  Closure:   Staples  Implants:   Biomet press-fit system with a #11 laterally offset Echo femoral stem, a 58 mm acetabular shell with an E-poly hi-wall liner, and a 36 mm ceramic head with a +0 mm neck.  Brief Clinical Note:   The patient is a 68 year old male with a long history of progressively worsening right hip/groin pain. His symptoms have progressed despite medications, activity modification, etc. His history and examination consistent with degenerative joint disease confirmed by both plain radiographs and MRI scan. The patient presents at this time for a right total hip arthroplasty.   Procedure:   The patient was brought into the operating room. After adequate spinal anesthesia was obtained, the patient was repositioned in the left lateral decubitus position and secured using a lateral hip positioner. The right hip and lower extremity were prepped with ChloroPrep solution before being draped sterilely. Preoperative antibiotics were administered. A timeout was performed to verify the appropriate surgical site.    A standard posterior approach to the hip was made through an approximately 7-8 inch incision. The incision was carried down through the subcutaneous tissues to expose the gluteal fascia and proximal end of the iliotibial band. These structures were split the length of the incision and the Charnley self-retaining hip retractor placed. The bursal tissues were swept posteriorly to expose the short external rotators. The anterior border of the  piriformis tendon was identified and this plane developed down through the capsule to enter the joint. A flap of tissue was elevated off the posterior aspect of the femoral neck and greater trochanter and retracted posteriorly. This flap included the piriformis tendon, the short external rotators, and the posterior capsule. The soft tissues were elevated off the lateral aspect of the ilium and a large Steinmann pin placed bicortically.   With the right leg aligned over the left, a drill bit was placed into the greater trochanter parallel to the Steinmann pin and the distance between these two pins measured in order to optimize leg lengths postoperatively. The drill bit was removed and the hip dislocated. The piriformis fossa was debrided of soft tissues before the intramedullary canal was accessed through this point using a triple step reamer. The canal was reamed sequentially beginning with a #7 tapered reamer and progressing to a # 11 tapered reamer. This provided excellent circumferential chatter. Using the appropriate guide, a femoral neck cut was made 10-12 mm above the lesser trochanter. The femoral head was removed.  Attention was directed to the acetabular side. The labrum was debrided circumferentially before the ligamentum teres was removed using a large curette. A line was drawn on the drapes corresponding to the native version of the acetabulum. This line was used as a guide while the acetabulum was reamed sequentially beginning with a 45 mm reamer and progressing to a 57 mm reamer. This provided excellent circumferential chatter. The 57 mm trial acetabulum was positioned and found to fit  quite well. Therefore, the 58 mm acetabular shell was selected and impacted into place with care taken to maintain the appropriate version. The trial high wall liner was inserted.  Attention was redirected to the femoral side. A box osteotome was used to establish version before the canal was broached sequentially  beginning with a #7 broach and progressing to a #11 broach. This was left in place and several trial reductions performed using both the standard and laterally offset neck options, as well as the -6 mm, -3 mm, and +0 mm neck lengths. After removing the trial components, the manhole cover was placed into the apex of the acetabular shell and tightened securely. The permanent E-polyethylene hi-wall liner was impacted into the acetabular shell and its locking mechanism verified using a quarter-inch osteotome. Next, the #11 laterally offset femoral stem was impacted into place with care taken to maintain the appropriate version. A repeat trial reduction was performed using the +0 mm neck length. The +0 mm neck length demonstrated excellent stability both in extension and external rotation as well as with flexion to 90 and internal rotation beyond 70. It also was stable in the position of sleep. In addition, leg lengths appeared to be restored appropriately, both by reassessing the position of the right leg over the left, as well as by measuring the distance between the Steinmann pin and the drill bit. The 36 mm ceramic head with the +0 mm neck was impacted onto the stem of the femoral component. The Morse taper locking mechanism was verified using manual distraction before the head was relocated and placed through a range of motion with the findings as described above.  The wound was copiously irrigated with sterile saline solution via the jet lavage system before the peri-incisional and pericapsular tissues were injected with a cocktail of 20 cc of Exparel , 30 cc of 0.5% Sensorcaine , 2 cc of Kenalog  40 (80 mg), and 30 mg of Toradol  diluted out to 90 cc with normal saline to help with postoperative analgesia. The posterior flap was reapproximated to the posterior aspect of the greater trochanter using #2 Tycron interrupted sutures placed through drill holes. Several additional #2 Tycron interrupted sutures were  used to reinforce this layer of closure. The iliotibial band was reapproximated using #1 Vicryl interrupted sutures before the gluteal fascia was closed using a running #0 Vicryl suture. The subcutaneous tissues were closed in several layers using 2-0 Vicryl interrupted sutures before the skin was closed using staples. A sterile occlusive dressing was applied to the wound. The patient was then rolled back into the supine position on his/her hospital bed before being awakened and returned to the recovery room in satisfactory condition after tolerating the procedure well.

## 2024-01-21 NOTE — Anesthesia Procedure Notes (Signed)
 Spinal  Patient location during procedure: OR Start time: 01/21/2024 11:08 AM End time: 01/21/2024 11:14 AM Reason for block: surgical anesthesia Staffing Performed: anesthesiologist  Anesthesiologist: Shantinique Picazo, Fairy POUR, MD Performed by: Stevan Fairy POUR, MD Authorized by: Stevan Fairy POUR, MD   Preanesthetic Checklist Completed: patient identified, IV checked, site marked, risks and benefits discussed, surgical consent, monitors and equipment checked, pre-op evaluation and timeout performed Spinal Block Patient position: sitting Prep: ChloraPrep Patient monitoring: heart rate, continuous pulse ox, blood pressure and cardiac monitor Approach: midline Location: L3-4 Injection technique: single-shot Needle Needle type: Whitacre and Introducer  Needle gauge: 24 G Needle length: 9 cm Assessment Sensory level: T10 Events: CSF return Additional Notes Sterile aseptic technique used throughout the procedure.  Negative paresthesia. Negative blood return. Positive free-flowing CSF. Expiration date of kit checked and confirmed. Patient tolerated procedure well, without complications.

## 2024-01-21 NOTE — H&P (Addendum)
 History of Present Illness:  Rodney Clark is a 68 y.o. male who presents for follow-up of his worsening right hip pain. The patient has been treated in the past for early degenerative changes of the hip with degenerative labral tearing. He is undergone several steroid injections in the past which have provided temporary partial relief of his symptoms. However, the most recent injection performed 2 months ago did not provide any sustained relief. His symptoms are worse with any prolonged standing or ambulation. He also has difficulty reciprocating stairs. He is now ready to proceed with scheduling a right total hip arthroplasty.  Current Outpatient Medications:  acetaminophen  (TYLENOL ) 500 MG tablet Take 1,000 mg by mouth every 6 (six) hours as needed for moderate pain (pain score 4-6).  diazePAM  (VALIUM ) 10 MG tablet Take 10 mg by mouth every 6 (six) hours as needed for Anxiety  famotidine  (PEPCID ) 20 MG tablet TAKE ONE TABLET EVERY DAY 90 tablet 3  fenofibrate  160 MG tablet TAKE ONE TABLET EVERY DAY 90 tablet 1  FEXOFENADINE HCL (ALLEGRA ORAL) Take by mouth One daily  gabapentin  (NEURONTIN ) 300 MG capsule TAKE 1 CAPSULE BY MOUTH 5 TIMES DAILY. 450 capsule 1  hydroCHLOROthiazide  (HYDRODIURIL ) 25 MG tablet Take 1 tablet (25 mg total) by mouth once daily 90 tablet 3  ketoconazole  (NIZORAL ) 2 % shampoo Apply 2 % topically 3 (three) times a week  losartan  (COZAAR ) 50 MG tablet TAKE ONE TABLET EVERY DAY 90 tablet 1  melatonin-pyridoxine, vit B6, (MELATONIN, WITH B6,) 5-1 mg Tab Take 10 mg by mouth at bedtime  pantoprazole  (PROTONIX ) 40 MG DR tablet TAKE 1 TABLET BY MOUTH ONCE DAILY AS DIRECTED. 90 tablet 3  pravastatin  (PRAVACHOL ) 10 MG tablet TAKE ONE TABLET BY MOUTH EVERY DAY 90 tablet 3  predniSONE  (DELTASONE ) 10 MG tablet Take as directed - 12 day taper 48 tablet 0  tirzepatide  (MOUNJARO ) 2.5 mg/0.5 mL pen injector Inject 0.5 mLs (2.5 mg total) subcutaneously once a week 2 mL 5  tiZANidine   (ZANAFLEX ) 4 MG tablet Take 1 tablet (4 mg total) by mouth 3 (three) times daily as needed  traMADoL  (ULTRAM ) 50 mg tablet 1 po bid prn 60 tablet 5  traZODone (DESYREL) 50 MG tablet Take 1 tablet (50 mg total) by mouth at bedtime as needed for Sleep 90 tablet 3   Allergies:  Crestor [Rosuvastatin] Other (Joint pain)   Past Medical History:  Allergic state  allergic rhinitis  Aneurysm of ascending aorta without rupture () 09/24/2021  4.1 cm by CTA 6/23; yearly monitoring recommended  Arthritis  h/o orthopedics evaluation; DDD on CT 8/15  Atypical chest pain  Myoview 8/14 negative for ischemia.  Epigastric pain 05/2009  Myoview negative for ischemia. Echo with normal heart function, mild pulmonary hypertension. Improved with H2 blockers  GERD (gastroesophageal reflux disease)  Gout  pt denies  History of cancer do not recall  Skin cancers  Hyperglycemia 02/15/2014  Hyperlipidemia  did not tolerate Crestor  Hypertension  Numbness of toes  bilateral, consistent with peripheral neuropathy, likely from arthritis. B-12, TSH previously unremarkable.  Sleep apnea - on bipap  Type 2 diabetes mellitus with complication (CMS/HHS-HCC) 09/24/2021   Past Surgical History:  COLONOSCOPY N/A 05/14/2015 ((10/15/09) Dr. FABIENE Holmes @ [facility] Polyp: CBF 05/2020)  BACK SURGERY 2019  POSTERIOR LAMINECTOMY / DECOMPRESSION LUMBAR SPINE 12/2017 (L5-S1; Dr. Malcolm)  EXTRACTION CATARACT EXTRACAPSULAR W/INSERTION INTRAOCULAR PROSTHESIS Left 06/15/2019  Procedure: L- LENSX - EXTRACTION CATARACT EXTRACAPSULAR WITH PHACO WITH INSERTION INTRAOCULAR PROSTHESIS; Surgeon:  Bordelon, Therisa Cohn, MD; Location: [facility] OR; Service: Ophthalmology; Laterality: Left;  EXTRACTION CATARACT EXTRACAPSULAR W/INSERTION INTRAOCULAR PROSTHESIS Right 07/06/2019  Procedure: R- LENSX - EXTRACTION CATARACT EXTRACAPSULAR WITH PHACO WITH INSERTION INTRAOCULAR PROSTHESIS; Surgeon: Thurman Therisa Cohn, MD; Location: [facility] OR; Service:  Ophthalmology; Laterality: Right;  C5-6, C6-7 anterior cervical discectomy with interbody fusion utilizing interbody cages, locally harvested autograft, and anterior plate instrumentation 12/24/2021  1. Arthroscopic debridement with abrasion chondroplasty of patella, right knee. 2. Open excision of symptomatic bipartite patellar fragment, right knee Right 07/30/2022 (Dr. Edie)  REVERSE TOTAL SHOULDER Right 05/20/2023 (Dr. Edie)  APPENDECTOMY  ARTHROSCOPIC ROTATOR CUFF REPAIR Right  HERNIA REPAIR inguinal hernia x 2  SKIN BIOPSY  TONSILLECTOMY   Family History  Problem Relation Name Age of Onset  Alzheimer's disease Mother Rodney Clark  Osteoporosis (Thinning of bones) Mother Rodney Clark  Other Father Rodney Clark  Bladder polyps  Colon polyps Father Rodney Clark  Glaucoma Neg Hx  Macular degeneration Neg Hx   Social History:   Socioeconomic History:  Marital status: Married  Tobacco Use  Smoking status: Never  Smokeless tobacco: Never  Vaping Use  Vaping status: Never Used  Substance and Sexual Activity  Alcohol  use: Not Currently  Alcohol /week: 1.0 standard drink of alcohol   Types: 1 Cans of beer per week  Comment: occasionally  Drug use: Never  Sexual activity: Never  Partners: Female  Social History Narrative  Retired with part time job.   Social Drivers of Health:   Physicist, medical Strain: Low Risk (09/15/2023)  Overall Financial Resource Strain (CARDIA)  Difficulty of Paying Living Expenses: Not hard at all  Food Insecurity: No Food Insecurity (09/15/2023)  Hunger Vital Sign  Worried About Running Out of Food in the Last Year: Never true  Ran Out of Food in the Last Year: Never true  Transportation Needs: No Transportation Needs (09/15/2023)  PRAPARE - Risk analyst (Medical): No  Lack of Transportation (Non-Medical): No   Review of Systems:  A comprehensive 14 point ROS was performed, reviewed, and the pertinent orthopaedic  findings are documented in the HPI.  Physical Exam: Vitals:  11/20/23 0833  BP: 139/76  Weight: (!) 139.3 kg (307 lb)  Height: 177.8 cm (5' 10)   General/Constitutional: Pleasant significantly overweight middle-age male in no acute distress. Neuro/Psych: Normal mood and affect, oriented to person, place and time. Lymphatic: No palpable adenopathy. Respiratory: Patient has good chest rise and fall with inspiration and expiration. All lung fields are clear to auscultation bilaterally. There is no Rales, rhonchi or wheezes appreciated. Cardiovascular: Upon auscultation there is a regular rate and rhythm. Appreciable systolic grade 2/3 murmur best heard over the aortic region. No rubs, gallops or heaves appreciated. There does not appear to be any swelling down the lower extremities. Posterior tibial pulses appreciated bilaterally, 2+. Integumentary: No impressive skin lesions present, except as noted in detailed exam.   Lumbar exam: Skin inspection of the lower back again is notable for a healed surgical incision. In stance, his spine is straight and his pelvis is level. He is able to arise from a seated position without difficulty, and ambulates with an essentially normal gait. He does not use any assistive devices . He denies any tenderness along the thoracic or lumbar spine, nor across the sacrum. He also denies any discomfort over either sciatic notch region, either SI joint, or either trochanteric region. He is able to heel raise and toe raise with weakness in heel raising on the right .  Hip exam: He exhibits flexion to 100 degrees, internal rotation to 15 degrees, and external rotation to 5 degrees on the right. He experiences mild pain with right hip flexion and internal rotation. He remains neurovascularly intact to both lower extremities, other than slightly decreased strength with dorsiflexion, plantarflexion, inversion, and eversion of the right foot. He demonstrates negative sitting  straight leg raises bilaterally.  Imaging:  A recent MRI scan of the right hip is available for review and has been reviewed by myself.  By report, the study demonstrates evidence of moderate osteoarthritis of the right hip with moderate size joint effusion and degenerative superior labral tearing anteriorly and posteriorly it also demonstrates evidence of mild tendinosis of the distal right gluteus minimus and medius tendons with associated peritrochanteric edema.  Both the films and report were reviewed by myself and discussed with the patient.  Assessment: 1. Primary osteoarthritis of right hip.  Plan: The treatment options were discussed with the patient. In addition, patient educational materials were provided regarding the diagnosis and treatment options. Regarding his right hip/groin symptoms, the patient is becoming increasingly frustrated by the symptoms and is not ready to consider more aggressive treatment options. Therefore, I have recommended a surgical procedure, specifically a right total hip arthroplasty. The procedure was discussed with the patient, as were the potential risks (including bleeding, infection, nerve and/or blood vessel injury, persistent or recurrent pain, loosening and/or failure of the components, dislocation, leg length inequality, need for further surgery, blood clots, strokes, heart attacks and/or arhythmias, pneumonia, etc.) and benefits. The patient states his understanding and wishes to proceed. All of the patient's questions and concerns were answered. He can call any time with further concerns. He will follow up post-surgery, routine.    H&P reviewed and patient re-examined. No changes.

## 2024-01-21 NOTE — Anesthesia Preprocedure Evaluation (Signed)
 Anesthesia Evaluation  Patient identified by MRN, date of birth, ID band Patient awake    Reviewed: Allergy & Precautions, NPO status , Patient's Chart, lab work & pertinent test results  Airway Mallampati: III  TM Distance: >3 FB Neck ROM: full    Dental  (+) Chipped   Pulmonary neg shortness of breath, sleep apnea and Continuous Positive Airway Pressure Ventilation    Pulmonary exam normal        Cardiovascular Exercise Tolerance: Good hypertension, (-) angina (-) Past MI Normal cardiovascular exam     Neuro/Psych  Neuromuscular disease  negative psych ROS   GI/Hepatic Neg liver ROS,GERD  Controlled,,  Endo/Other  diabetes, Type 2    Renal/GU      Musculoskeletal   Abdominal   Peds  Hematology negative hematology ROS (+)   Anesthesia Other Findings Past Medical History: No date: Actinic keratosis No date: Allergic state No date: Allergy No date: Aneurysm of ascending aorta without rupture No date: Aortic atherosclerosis No date: Atypical chest pain No date: Cervical disc disease 09/19/2021: Cholelithiasis No date: DM (diabetes mellitus), type 2 (HCC) No date: Elevated PSA No date: Essential hypertension No date: GERD (gastroesophageal reflux disease) No date: Gout No date: Hyperlipidemia No date: Lumbar disc disease No date: Neuromuscular disorder (HCC)     Comment:  nerve damage right leg No date: Numbness of toes No date: OSA on CPAP No date: Osteoarthritis of right hip 09/19/2021: Polyradiculitis No date: Prostate cancer (HCC) No date: Sacroiliitis 01/05/2018: Spondylolisthesis at L5-S1 level 11/16/2018: Squamous cell carcinoma of skin     Comment:  left nasal supratip (Dr. Dela). Tx: LN2,               5FU/Calcipotriene cream No date: Squamous cell carcinoma of skin     Comment:  L temple, txted yrs ago No date: Stenosis of cervical spine with myelopathy [facility])  Past Surgical  History: 12/24/2021: ANTERIOR CERVICAL DECOMP/DISCECTOMY FUSION; N/A     Comment:  Procedure: Anterior Cervical Decompression Fusion                Cervical five-Cervical six - Cervical six-Cervical seven;              Surgeon: Louis Shove, MD;  Location: [facility] OR;  Service:               Neurosurgery;  Laterality: N/A; No date: APPENDECTOMY 06/15/2019: CATARACT EXTRACTION W/ INTRAOCULAR LENS IMPLANT; Left 07/06/2019: CATARACT EXTRACTION W/ INTRAOCULAR LENS IMPLANT; Right 05/14/2015: COLONOSCOPY; N/A     Comment:  Procedure: COLONOSCOPY;  Surgeon: Lamar ONEIDA Holmes, MD;               Location[facility] ENDOSCOPY;  Service: Endoscopy;                Laterality: N/A; No date: COLONOSCOPY No date: INGUINAL HERNIA REPAIR; Right     Comment:  x2 07/30/2022: KNEE ARTHROSCOPY; Right     Comment:  Procedure: RIGHT KNEE ARTHROSCOPY WITH DEBRIDEMENT AND               EXCISION OF SYMPTOMATIC BIPARTITE FRAGMENT.;  Surgeon:               Edie Norleen PARAS, MD;  Location: [facility] ORS;  Service:               Orthopedics;  Laterality: Right; 01/05/2018: POSTERIOR LAMINECTOMY / DECOMPRESSION LUMBAR SPINE     Comment:  L5-S1 05/20/2023: REVERSE SHOULDER ARTHROPLASTY; Right  Comment:  Procedure: REVERSE SHOULDER ARTHROPLASTY WITH BICEPS               TENODESIS - RNFA;  Surgeon: Edie Norleen PARAS, MD;  Location:              [facility] ORS;  Service: Orthopedics;  Laterality: Right; No date: ROTATOR CUFF REPAIR; Right No date: TONSILLECTOMY     Reproductive/Obstetrics negative OB ROS                              Anesthesia Physical Anesthesia Plan  ASA: 3  Anesthesia Plan: Spinal   Post-op Pain Management:    Induction:   PONV Risk Score and Plan:   Airway Management Planned: Natural Airway and Nasal Cannula  Additional Equipment:   Intra-op Plan:   Post-operative Plan:   Informed Consent: I have reviewed the patients History and Physical, chart, labs and discussed the  procedure including the risks, benefits and alternatives for the proposed anesthesia with the patient or authorized representative who has indicated his/her understanding and acceptance.     Dental Advisory Given  Plan Discussed with: Anesthesiologist, CRNA and Surgeon  Anesthesia Plan Comments: (Patient reports no bleeding problems and no anticoagulant use.  Plan for spinal with backup GA  Patient consented for risks of anesthesia including but not limited to:  - adverse reactions to medications - damage to eyes, teeth, lips or other oral mucosa - nerve damage due to positioning  - risk of bleeding, infection and or nerve damage from spinal that could lead to paralysis - risk of headache or failed spinal - damage to teeth, lips or other oral mucosa - sore throat or hoarseness - damage to heart, brain, nerves, lungs, other parts of body or loss of life  Patient voiced understanding and assent.)        Anesthesia Quick Evaluation

## 2024-01-22 ENCOUNTER — Other Ambulatory Visit: Payer: Self-pay

## 2024-01-22 ENCOUNTER — Encounter: Payer: Self-pay | Admitting: Surgery

## 2024-01-22 DIAGNOSIS — M1611 Unilateral primary osteoarthritis, right hip: Secondary | ICD-10-CM | POA: Diagnosis not present

## 2024-01-22 LAB — BASIC METABOLIC PANEL WITH GFR
Anion gap: 8 (ref 5–15)
BUN: 16 mg/dL (ref 8–23)
CO2: 25 mmol/L (ref 22–32)
Calcium: 9 mg/dL (ref 8.9–10.3)
Chloride: 102 mmol/L (ref 98–111)
Creatinine, Ser: 0.85 mg/dL (ref 0.61–1.24)
GFR, Estimated: >60 mL/min (ref >60)
Glucose, Bld: 160 mg/dL — ABNORMAL HIGH (ref 70–99)
Potassium: 4.1 mmol/L (ref 3.5–5.1)
Sodium: 135 mmol/L (ref 135–145)

## 2024-01-22 LAB — CBC
HCT: 36.5 % — ABNORMAL LOW (ref 39.0–52.0)
Hemoglobin: 12.6 g/dL — ABNORMAL LOW (ref 13.0–17.0)
MCH: 30.7 pg (ref 26.0–34.0)
MCHC: 34.5 g/dL (ref 30.0–36.0)
MCV: 88.8 fL (ref 80.0–100.0)
Platelets: 241 K/uL (ref 150–400)
RBC: 4.11 MIL/uL — ABNORMAL LOW (ref 4.22–5.81)
RDW: 12.1 % (ref 11.5–15.5)
WBC: 13.4 K/uL — ABNORMAL HIGH (ref 4.0–10.5)
nRBC: 0 % (ref 0.0–0.2)

## 2024-01-22 MED ORDER — GABAPENTIN 300 MG PO CAPS
ORAL_CAPSULE | ORAL | Status: AC
  Filled 2024-01-22: qty 1

## 2024-01-22 MED ORDER — ACETAMINOPHEN 500 MG PO TABS
1000.0000 mg | ORAL_TABLET | Freq: Four times a day (QID) | ORAL | 0 refills | Status: AC
  Filled 2024-01-22: qty 30, 4d supply, fill #0

## 2024-01-22 MED ORDER — ONDANSETRON HCL 4 MG PO TABS
4.0000 mg | ORAL_TABLET | Freq: Four times a day (QID) | ORAL | 0 refills | Status: AC | PRN
  Filled 2024-01-22: qty 20, 5d supply, fill #0

## 2024-01-22 MED ORDER — OXYCODONE HCL 5 MG PO TABS
5.0000 mg | ORAL_TABLET | ORAL | 0 refills | Status: AC | PRN
  Filled 2024-01-22: qty 40, 4d supply, fill #0

## 2024-01-22 MED ORDER — APIXABAN 2.5 MG PO TABS
2.5000 mg | ORAL_TABLET | Freq: Two times a day (BID) | ORAL | 0 refills | Status: AC
  Filled 2024-01-22: qty 56, 28d supply, fill #0

## 2024-01-22 MED ORDER — DOCUSATE SODIUM 100 MG PO CAPS
100.0000 mg | ORAL_CAPSULE | Freq: Two times a day (BID) | ORAL | 0 refills | Status: AC
  Filled 2024-01-22: qty 10, 5d supply, fill #0

## 2024-01-22 NOTE — Progress Notes (Signed)
 Patient is not able to walk the distance required to go the bathroom, or he is unable to safely negotiate stairs required to access the bathroom.  A 3in1 BSC will alleviate this problem.        Lollie Marrow, PA-C [facility]

## 2024-01-22 NOTE — Progress Notes (Signed)
   Subjective: 1 Day Post-Op Procedure(s) (LRB): ARTHROPLASTY, HIP, TOTAL,POSTERIOR APPROACH (Right) Patient reports pain as mild.   Patient is well, and has had no acute complaints or problems Denies any CP, SOB, ABD pain. We will continue therapy today.  Plan is to go Home after hospital stay.  Objective: Vital signs in last 24 hours: Temp:  [96.8 F (36 C)-98.4 F (36.9 C)] 98.4 F (36.9 C) (10/24 0348) Pulse Rate:  [51-107] 93 (10/24 0348) Resp:  [10-26] 18 (10/24 0348) BP: (97-154)/(61-98) 146/98 (10/24 0348) SpO2:  [91 %-99 %] 98 % (10/24 0348)  Intake/Output from previous day: 10/23 0701 - 10/24 0700 In: 1446.3 [P.O.:200; I.V.:746.3; IV Piggyback:500] Out: 1250 [Urine:1050; Blood:200] Intake/Output this shift: No intake/output data recorded.  Recent Labs    01/22/24 0545  HGB 12.6*   Recent Labs    01/22/24 0545  WBC 13.4*  RBC 4.11*  HCT 36.5*  PLT 241   Recent Labs    01/22/24 0545  NA 135  K 4.1  CL 102  CO2 25  BUN 16  CREATININE 0.85  GLUCOSE 160*  CALCIUM 9.0   No results for input(s): LABPT, INR in the last 72 hours.  EXAM General - Patient is Alert, Appropriate, and Oriented Extremity - Neurovascular intact Sensation intact distally Intact pulses distally Dorsiflexion/Plantar flexion intact No cellulitis present Compartment soft Dressing - dressing C/D/I and no drainage Motor Function - intact, moving foot and toes well on exam.   Past Medical History:  Diagnosis Date   Actinic keratosis    Allergic state    Allergy    Aneurysm of ascending aorta without rupture    Aortic atherosclerosis    Atypical chest pain    Cervical disc disease    Cholelithiasis 09/19/2021   DM (diabetes mellitus), type 2 (HCC)    Elevated PSA    Essential hypertension    GERD (gastroesophageal reflux disease)    Gout    Hyperlipidemia    Lumbar disc disease    Neuromuscular disorder (HCC)    nerve damage right leg   Numbness of toes     OSA on CPAP    Osteoarthritis of right hip    Polyradiculitis 09/19/2021   Prostate cancer (HCC)    Sacroiliitis    Spondylolisthesis at L5-S1 level 01/05/2018   Squamous cell carcinoma of skin 11/16/2018   left nasal supratip (Dr. Dela). Tx: LN2, 5FU/Calcipotriene cream   Squamous cell carcinoma of skin    L temple, txted yrs ago   Stenosis of cervical spine with myelopathy (HCC)     Assessment/Plan:   1 Day Post-Op Procedure(s) (LRB): ARTHROPLASTY, HIP, TOTAL,POSTERIOR APPROACH (Right) Principal Problem:   Status post total hip replacement, right  Estimated body mass index is 42.46 kg/m as calculated from the following:   Height as of 01/08/24: 5' 10 (1.778 m).   Weight as of 01/08/24: 134.2 kg. Advance diet Up with therapy Pain well controlled Labs and VSS CM to assist with discharge to home with Outpatient PT. Outpatient PT already scheduled with [facility]  DVT Prophylaxis - TED hose and SCDs Eliquis Weight-Bearing as tolerated to right leg   T. Medford Amber, PA-C [facility] 01/22/2024, 8:11 AM

## 2024-01-22 NOTE — Progress Notes (Signed)
 Physical Therapy Treatment Patient Details Name: Rodney Clark MRN: [id number] DOB: 1955/08/03 Today's Date: 01/22/2024   History of Present Illness Pt is a 68 y/o M s/p R THA posterior approach on 01/21/24. PMH significant for AAA 4.1cm w/o rupture, chest pain, GERD, gout, HLD, HTN, T2DM.    PT Comments  Pt alert, pleasant and agreeable to participate in PT tx. Pt reported R hip pain pf 3/10 intensity at start of session. Pt was met sitting in recliner, able to perform STS transfers with CGA and mod VC for hand placement on armrests/RW. Pt amb ~231ft over 2 bouts, seated rest break provided between. No LOB or buckling throughout, pt able to progress to step-through pattern with mild decreased stance time/weight shift on RLE- heavy BUE support on RW throughout. Pt able to navigate set of 4 stairs twice with RW, ascending backward and descending forward with max VC/demonstration for step sequencing- no LOB throughout. Pt's wife present for stair training and was educated on RW stabilization for home stair navigation. Extensive education and demonstration provided for performing safe car transfers while adhering to posterior hip precautions. Pt was left seated in recliner at end of session, all needs in reach. The patient would benefit from further skilled PT intervention to continue to progress towards goals.      If plan is discharge home, recommend the following: A little help with walking and/or transfers;A little help with bathing/dressing/bathroom;Assist for transportation;Help with stairs or ramp for entrance   Can travel by private vehicle        Equipment Recommendations  BSC/3in1 (bariatric BSC)    Recommendations for Other Services       Precautions / Restrictions Precautions Precautions: Posterior Hip;Fall Precaution Booklet Issued: Yes (comment) Recall of Precautions/Restrictions: Intact Precaution/Restrictions Comments: pt able to recall 3/3 posterior hip precautions  without cuing Restrictions Weight Bearing Restrictions Per Provider Order: Yes RLE Weight Bearing Per Provider Order: Weight bearing as tolerated     Mobility  Bed Mobility               General bed mobility comments: NT pt up in recliner pre/post tx    Transfers Overall transfer level: Needs assistance Equipment used: Rolling walker (2 wheels) Transfers: Sit to/from Stand Sit to Stand: Contact guard assist           General transfer comment: STS from recliner and transport chair with CGA, VC for hand placement    Ambulation/Gait Ambulation/Gait assistance: Contact guard assist Gait Distance (Feet): 250 Feet Assistive device: Rolling walker (2 wheels) Gait Pattern/deviations: Step-through pattern, Decreased stance time - right, Decreased weight shift to right, Wide base of support Gait velocity: decreased     General Gait Details: No LOB or buckling, heavy BUE support on RW throughout. Pt reported increased R hip pain with WB of 7/10 intensity. VC for R foot positioning with turns to R to prevent hip internal rotation   Stairs Stairs: Yes Stairs assistance: Contact guard assist, +2 safety/equipment Stair Management: No rails, Step to pattern, Forwards, Backwards, With walker Number of Stairs: 8 (set of 4 stairs, twice) General stair comments: Pt able to navigate set of 4 stairs twice with RW, ascending backward and descending forward with step-to pattern. Pt given max VC and demo of step sequencing and RW positioning. Pt's spouse present for training. No LOB or buckling   Wheelchair Mobility     Tilt Bed    Modified Rankin (Stroke Patients Only)       Balance Overall  balance assessment: Needs assistance Sitting-balance support: Feet supported Sitting balance-Leahy Scale: Good Sitting balance - Comments: steady static and dynamic sitting   Standing balance support: Bilateral upper extremity supported, Reliant on assistive device for balance Standing  balance-Leahy Scale: Good Standing balance comment: heavy BUE support on RW                            Communication Communication Communication: No apparent difficulties  Cognition Arousal: Alert Behavior During Therapy: WFL for tasks assessed/performed   PT - Cognitive impairments: No apparent impairments                       PT - Cognition Comments: A&Ox4, pleasant and cooperative throughout Following commands: Intact      Cueing Cueing Techniques: Verbal cues, Visual cues  Exercises Other Exercises Other Exercises: HR 96 SpO2 94% on RA after completing ambulation    General Comments General comments (skin integrity, edema, etc.): dressing appears the same pre/post session      Pertinent Vitals/Pain Pain Assessment Pain Assessment: 0-10 Pain Score: 3  Pain Location: R hip Pain Descriptors / Indicators: Grimacing, Operative site guarding Pain Intervention(s): Monitored during session, Repositioned    Home Living Family/patient expects to be discharged to:: Private residence Living Arrangements: Spouse/significant other Available Help at Discharge: Family;Available 24 hours/day Type of Home: House Home Access: Stairs to enter Entrance Stairs-Rails: Right;Left;Can reach both Entrance Stairs-Number of Steps: 2 Alternate Level Stairs-Number of Steps: flight Home Layout: Two level;Able to live on main level with bedroom/bathroom Home Equipment: Rolling Walker (2 wheels);Rollator (4 wheels);Cane - single point;Shower seat;Adaptive equipment      Prior Function            PT Goals (current goals can now be found in the care plan section) Progress towards PT goals: Progressing toward goals    Frequency    BID      PT Plan      Co-evaluation              AM-PAC PT 6 Clicks Mobility   Outcome Measure  Help needed turning from your back to your side while in a flat bed without using bedrails?: A Little Help needed moving from  lying on your back to sitting on the side of a flat bed without using bedrails?: A Little Help needed moving to and from a bed to a chair (including a wheelchair)?: A Little Help needed standing up from a chair using your arms (e.g., wheelchair or bedside chair)?: A Little Help needed to walk in hospital room?: A Little Help needed climbing 3-5 steps with a railing? : A Little 6 Click Score: 18    End of Session Equipment Utilized During Treatment: Gait belt Activity Tolerance: Patient tolerated treatment well Patient left: in chair;with call bell/phone within reach;with family/visitor present;with SCD's reapplied Nurse Communication: Mobility status PT Visit Diagnosis: Other abnormalities of gait and mobility (R26.89);Muscle weakness (generalized) (M62.81);Difficulty in walking, not elsewhere classified (R26.2);Pain Pain - Right/Left: Right Pain - part of body: Hip     Time: 9061-8983 PT Time Calculation (min) (ACUTE ONLY): 38 min  Charges:    $Gait Training: 23-37 mins $Therapeutic Activity: 8-22 mins PT General Charges $$ ACUTE PT VISIT: 1 Visit                     Janell Axe, SPT

## 2024-01-22 NOTE — Evaluation (Signed)
 Occupational Therapy Evaluation Patient Details Name: Rodney Clark MRN: [id number] DOB: 05/31/1955 Today's Date: 01/22/2024   History of Present Illness   Pt is a 68 y/o M s/p R THA posterior approach on 01/21/24. PMH significant for AAA 4.1cm w/o rupture, chest pain, GERD, gout, HLD, HTN, T2DM.     Clinical Impressions Pt seen for OT evaluation this date, POD#1 from above surgery. PTA pt is MOD I-I with ADL/IADL, amb with SPC PRN. Pt instructed in posterior total hip precautions and how to implement, self care skills, falls prevention strategies, home/routines modifications, DME/AE for LB bathing and dressing tasks, compression stocking mgt strategies. Good demo of hip precautions with intermittent vcs throughout. Pt wife reports she will assist with LB dressing.  Pt would benefit from additional instruction in self care skills and techniques to help maintain precautions with or without assistive devices to support recall and carryover prior to discharge. OT will follow acutely.      If plan is discharge home, recommend the following:   A little help with bathing/dressing/bathroom;Assist for transportation;Assistance with cooking/housework     Functional Status Assessment   Patient has had a recent decline in their functional status and demonstrates the ability to make significant improvements in function in a reasonable and predictable amount of time.     Equipment Recommendations   Other (comment) (bariatric bsc)     Recommendations for Other Services         Precautions/Restrictions   Precautions Precautions: Posterior Hip;Fall Precaution Booklet Issued: Yes (comment) Recall of Precautions/Restrictions: Intact Restrictions Weight Bearing Restrictions Per Provider Order: Yes RLE Weight Bearing Per Provider Order: Weight bearing as tolerated     Mobility Bed Mobility Overal bed mobility: Needs Assistance Bed Mobility: Supine to Sit     Supine to sit:  Supervision, HOB elevated          Transfers Overall transfer level: Needs assistance Equipment used: Rolling walker (2 wheels) Transfers: Sit to/from Stand Sit to Stand: Supervision                  Balance Overall balance assessment: Needs assistance Sitting-balance support: Feet supported Sitting balance-Leahy Scale: Good     Standing balance support: Bilateral upper extremity supported, Reliant on assistive device for balance Standing balance-Leahy Scale: Good                             ADL either performed or assessed with clinical judgement   ADL Overall ADL's : Needs assistance/impaired Eating/Feeding: Set up   Grooming: Set up           Upper Body Dressing : Set up;Sitting   Lower Body Dressing: Minimal assistance;Adhering to hip precautions Lower Body Dressing Details (indicate cue type and reason): with use of reacher, intermittent vcs Toilet Transfer: Supervision/safety;Rolling walker (2 wheels) Toilet Transfer Details (indicate cue type and reason): simulated         Functional mobility during ADLs: Supervision/safety;Rolling walker (2 wheels)       Vision Patient Visual Report: No change from baseline       Perception         Praxis         Pertinent Vitals/Pain Pain Assessment Pain Assessment: Faces Pain Score: 4  Pain Location: R hip, with movement Pain Descriptors / Indicators: Grimacing, Operative site guarding Pain Intervention(s): Monitored during session, Repositioned     Extremity/Trunk Assessment Upper Extremity Assessment Upper Extremity Assessment: Overall [facility]  for tasks assessed   Lower Extremity Assessment Lower Extremity Assessment: Defer to PT evaluation       Communication Communication Communication: No apparent difficulties   Cognition Arousal: Alert Behavior During Therapy: WFL for tasks assessed/performed Cognition: No apparent impairments                                Following commands: Intact       Cueing  General Comments   Cueing Techniques: Verbal cues;Visual cues  dressing appears the same pre/post session   Exercises     Shoulder Instructions      Home Living Family/patient expects to be discharged to:: Private residence Living Arrangements: Spouse/significant other Available Help at Discharge: Family;Available 24 hours/day Type of Home: House Home Access: Stairs to enter [employer] of Steps: 2 Entrance Stairs-Rails: Right;Left;Can reach both Home Layout: Two level;Able to live on main level with bedroom/bathroom Alternate Level Stairs-Number of Steps: flight Alternate Level Stairs-Rails: Right Bathroom Shower/Tub: Chief Strategy Officer: Standard     Home Equipment: Agricultural consultant (2 wheels);Rollator (4 wheels);Cane - single point;Shower seat;Adaptive equipment Adaptive Equipment: Reacher;Long-handled shoe horn        Prior Functioning/Environment Prior Level of Function : Independent/Modified Independent;History of Falls (last six months)             Mobility Comments: PRN us  of SPC the last few weeks ADLs Comments: MOD I-I with ADL/IADL    OT Problem List: Impaired balance (sitting and/or standing);Decreased activity tolerance;Decreased knowledge of use of DME or AE   OT Treatment/Interventions: Self-care/ADL training;Balance training;Therapeutic exercise;DME and/or AE instruction;Patient/family education      OT Goals(Current goals can be found in the care plan section)   Acute Rehab OT Goals Patient Stated Goal: go home OT Goal Formulation: With patient/family Time For Goal Achievement: 02/05/24 Potential to Achieve Goals: Good ADL Goals Pt Will Perform Lower Body Dressing: with modified independence;sitting/lateral leans;sit to/from stand;with adaptive equipment Pt Will Transfer to Toilet: with modified independence;ambulating   OT Frequency:  Min 2X/week    Co-evaluation               AM-PAC OT 6 Clicks Daily Activity     Outcome Measure Help from another person eating meals?: None Help from another person taking care of personal grooming?: None Help from another person toileting, which includes using toliet, bedpan, or urinal?: A Little Help from another person bathing (including washing, rinsing, drying)?: A Little Help from another person to put on and taking off regular upper body clothing?: None Help from another person to put on and taking off regular lower body clothing?: A Little 6 Click Score: 21   End of Session Equipment Utilized During Treatment: Rolling walker (2 wheels) Nurse Communication: Mobility status  Activity Tolerance: Patient tolerated treatment well Patient left: in chair;with call bell/phone within reach;with family/visitor present  OT Visit Diagnosis: Other abnormalities of gait and mobility (R26.89)                Time: 9143-9083 OT Time Calculation (min): 20 min Charges:  OT General Charges $OT Visit: 1 Visit OT Evaluation $OT Eval Low Complexity: 1 Low  Therisa Sheffield, OTD OTR/L  01/22/24, 10:39 AM

## 2024-01-22 NOTE — Discharge Summary (Signed)
 Physician Discharge Summary  Patient ID: Rodney Clark MRN: [id number] DOB/AGE: 1956-03-08 68 y.o.  Admit date: 01/21/2024 Discharge date: 01/22/2024  Admission Diagnoses:  Primary osteoarthritis of right hip [M16.11] Status post total hip replacement, right [Z96.641]   Discharge Diagnoses: Patient Active Problem List   Diagnosis Date Noted   Status post total hip replacement, right 01/21/2024   Cervical spondylosis with myelopathy and radiculopathy 12/24/2021   Right upper quadrant abdominal pain    Sacroiliitis    OSA (obstructive sleep apnea) 09/19/2021   Upper abdominal pain 09/19/2021   Obesity, Class III, BMI 40-49.9 (morbid obesity) (HCC) 09/19/2021   Polyradiculitis 09/19/2021   Cholelithiasis 09/19/2021   Thoracic aortic aneurysm without rupture 09/19/2021   Spondylolisthesis at L5-S1 level 01/05/2018   Essential hypertension 08/28/2015    Past Medical History:  Diagnosis Date   Actinic keratosis    Allergic state    Allergy    Aneurysm of ascending aorta without rupture    Aortic atherosclerosis    Atypical chest pain    Cervical disc disease    Cholelithiasis 09/19/2021   DM (diabetes mellitus), type 2 (HCC)    Elevated PSA    Essential hypertension    GERD (gastroesophageal reflux disease)    Gout    Hyperlipidemia    Lumbar disc disease    Neuromuscular disorder (HCC)    nerve damage right leg   Numbness of toes    OSA on CPAP    Osteoarthritis of right hip    Polyradiculitis 09/19/2021   Prostate cancer (HCC)    Sacroiliitis    Spondylolisthesis at L5-S1 level 01/05/2018   Squamous cell carcinoma of skin 11/16/2018   left nasal supratip (Dr. Dela). Tx: LN2, 5FU/Calcipotriene cream   Squamous cell carcinoma of skin    L temple, txted yrs ago   Stenosis of cervical spine with myelopathy (HCC)      Transfusion: none   Consultants (if any):   Discharged Condition: Improved  Hospital Course: Rodney Clark is an 68 y.o. male who was  admitted 01/21/2024 with a diagnosis of Status post total hip replacement, right and went to the operating room on 01/21/2024 and underwent the above named procedures.    Surgeries: Procedure(s): ARTHROPLASTY, HIP, TOTAL,POSTERIOR APPROACH on 01/21/2024 Patient tolerated the surgery well. Taken to PACU where she was stabilized and then transferred to the orthopedic floor.  Started on Eliquis, TEDs and SCDs applied bilaterally. Heels elevated on bed. No evidence of DVT. Negative Homan. Physical therapy started on day #1 for gait training and transfer. OT started day #1 for ADL and assisted devices.  Patient's IV was d/c on day #1. Patient was able to safely and independently complete all PT goals. PT recommending discharge to home.    On post op day #1 patient was stable and ready for discharge to home.  Implants:   Biomet press-fit system with a #11 laterally offset Echo femoral stem, a 58 mm acetabular shell with an E-poly hi-wall liner, and a 36 mm ceramic head with a +0 mm neck.    He was given perioperative antibiotics:  Anti-infectives (From admission, onward)    Start     Dose/Rate Route Frequency Ordered Stop   01/21/24 1730  ceFAZolin  (ANCEF ) IVPB 3g/150 mL premix        3 g 300 mL/hr over 30 Minutes Intravenous Every 6 hours 01/21/24 1549 01/21/24 2313   01/21/24 0600  ceFAZolin  (ANCEF ) IVPB 2g/100 mL premix  2 g 200 mL/hr over 30 Minutes Intravenous On call to O.R. 01/21/24 0128 01/21/24 1152     .  He was given sequential compression devices, early ambulation, and Eliquis TEDs for DVT prophylaxis.  He benefited maximally from the hospital stay and there were no complications.    Recent vital signs:  Vitals:   01/22/24 0000 01/22/24 0348  BP: 138/78 (!) 146/98  Pulse: 74 93  Resp: 18 18  Temp: (!) 97 F (36.1 C) 98.4 F (36.9 C)  SpO2: 96% 98%    Recent laboratory studies:  Lab Results  Component Value Date   HGB 12.6 (L) 01/22/2024   HGB 14.1  01/02/2024   HGB 14.6 10/21/2023   Lab Results  Component Value Date   WBC 13.4 (H) 01/22/2024   PLT 241 01/22/2024   No results found for: INR Lab Results  Component Value Date   NA 135 01/22/2024   K 4.1 01/22/2024   CL 102 01/22/2024   CO2 25 01/22/2024   BUN 16 01/22/2024   CREATININE 0.85 01/22/2024   GLUCOSE 160 (H) 01/22/2024    Discharge Medications:   Allergies as of 01/22/2024       Reactions   Crestor [rosuvastatin] Other (See Comments)   Joint pain   Lyrica [pregabalin]    Hung around too long and made me feel funny        Medication List     TAKE these medications    acetaminophen  500 MG tablet Commonly known as: TYLENOL  Take 2 tablets (1,000 mg total) by mouth every 6 (six) hours. What changed:  how much to take when to take this reasons to take this   apixaban 2.5 MG Tabs tablet Commonly known as: ELIQUIS Take 1 tablet (2.5 mg total) by mouth 2 (two) times daily for 28 days.   diazepam  5 MG tablet Commonly known as: VALIUM  Take 5 mg by mouth 2 (two) times daily as needed.   docusate sodium 100 MG capsule Commonly known as: COLACE Take 1 capsule (100 mg total) by mouth 2 (two) times daily.   famotidine  20 MG tablet Commonly known as: PEPCID  Take 20 mg by mouth every morning.   fenofibrate  160 MG tablet Take 160 mg by mouth daily.   fexofenadine 180 MG tablet Commonly known as: ALLEGRA Take 180 mg by mouth every morning.   gabapentin  300 MG capsule Commonly known as: NEURONTIN  Take 1 capsule (300 mg total) by mouth 5 (five) times daily.   hydrochlorothiazide  25 MG tablet Commonly known as: HYDRODIURIL  Take 25 mg by mouth daily.   losartan  50 MG tablet Commonly known as: COZAAR  Take 50 mg by mouth every morning.   Melatonin 10 MG Tbdp Take 10 mg by mouth at bedtime.   Mounjaro  2.5 MG/0.5ML Pen Generic drug: tirzepatide  Inject 2.5 mg into the skin once a week. What changed: additional instructions   mupirocin   ointment 2 % Commonly known as: BACTROBAN  Apply to wound TID until healed. What changed:  how much to take how to take this when to take this reasons to take this additional instructions   ondansetron  4 MG tablet Commonly known as: ZOFRAN  Take 1 tablet (4 mg total) by mouth every 6 (six) hours as needed for nausea.   oxyCODONE  5 MG immediate release tablet Commonly known as: Oxy IR/ROXICODONE  Take 1-2 tablets (5-10 mg total) by mouth every 4 (four) hours as needed for moderate pain (pain score 4-6).   pantoprazole  40 MG tablet Commonly known as:  PROTONIX  Take 40 mg by mouth at bedtime.   pravastatin  10 MG tablet Commonly known as: PRAVACHOL  Take 10 mg by mouth daily.   tiZANidine  4 MG tablet Commonly known as: ZANAFLEX  Take 4 mg by mouth at bedtime.   traMADol  50 MG tablet Commonly known as: ULTRAM  Take 50 mg by mouth every 6 (six) hours as needed for moderate pain (pain score 4-6).   traZODone 50 MG tablet Commonly known as: DESYREL Take 50 mg by mouth at bedtime.               Durable Medical Equipment  (From admission, onward)           Start     Ordered   01/22/24 0813  For home use only DME 3 n 1  Once        01/22/24 9187   01/22/24 0750  DME 3 n 1  Once       Comments: Bariatric 3-in-1 bedside commode   01/22/24 0749   01/22/24 0750  DME Walker rolling  Once       Comments: Bariatric rolling walker  Question Answer Comment  Walker: With 5 Inch Wheels   Patient needs a walker to treat with the following condition Status post total hip replacement, right      01/22/24 0749            Diagnostic Studies: DG HIP UNILAT W OR W/O PELVIS 2-3 VIEWS RIGHT Result Date: 01/21/2024 CLINICAL DATA:  Status post right hip replacement. EXAM: DG HIP (WITH OR WITHOUT PELVIS) 2-3V RIGHT COMPARISON:  None Available. FINDINGS: Right hip arthroplasty in expected alignment. No periprosthetic lucency or fracture. Recent postsurgical change includes air and  edema in the soft tissues. Overlying skin staples are partially visualized. IMPRESSION: Right hip arthroplasty without immediate postoperative complication. Electronically Signed   By: Andrea Gasman M.D.   On: 01/21/2024 15:37   CT Shoulder Left Wo Contrast Result Date: 01/02/2024 CLINICAL DATA:  Left shoulder trauma. EXAM: CT OF THE LEFT SHOULDER WITHOUT CONTRAST TECHNIQUE: Multidetector CT imaging of the left shoulder was performed according to the standard protocol. RADIATION DOSE REDUCTION: This exam was performed according to the departmental dose-optimization program which includes automated exposure control, adjustment of the mA and/or kV according to patient size and/or use of iterative reconstruction technique. COMPARISON:  Left shoulder series from today. FINDINGS: Bones/Joint/Cartilage There is no evidence of fracture or dislocation. Inferior spurs are noted at the [facility] joint, small subcortical degenerative cystic changes of the distal left clavicle. There is normal bone mineralization. No focal pathologic lesion is seen. There is mild flattening along the dorsal humeral head superiorly which could be due to prior impaction injury such as from an anterior shoulder dislocation, or could be within developmental variation. The humeral head is high riding with near abutment with the acromial undersurface, findings likely due to a chronic degenerative rotator cuff tear and chronic rotator cuff arthropathy. Inferior spurring at the glenohumeral joint is noted, partial joint space loss. Visualized left ribs are intact. There is C5-7 fusion plating with solid interbody fusions and metallic interbody hardware. This is only partially visible. There is dense ossification in the coracoclavicular ligament. Ligaments Suboptimally assessed by CT. Muscles and Tendons There is moderate atrophy in the supraspinatus, infraspinatus and teres minor muscles. There is high-grade through and through tearing in the  supraspinatus mid to posterior tendon fibers and conjoined tendon complex, although a portion of the more anterior supraspinatus insertion does remain. There are  scattered calcifications of the teres minor tendon consistent with calcific tendinopathy. The subscapularis muscle is maintained in bulk. The bicipital tendon is not well seen and there could be a partial or complete intra-articular tear of it. It begins to be seen at the lower aspect of the bicipital groove. No intramuscular fluid collections are seen. Soft tissues No mass or hematoma. No axillary adenopathy is seen. There is aortic atherosclerosis. Left main and LAD coronary arteries heavily calcified. Mild cardiomegaly. Visualized left lung is clear. IMPRESSION: 1. No evidence of fracture or dislocation. 2. High riding humeral head with near abutment with the acromial undersurface, findings likely due to a chronic degenerative rotator cuff tear and chronic rotator cuff arthropathy. 3. High-grade through and through tearing in the supraspinatus mid to posterior tendon fibers and conjoined tendon complex, although a portion of the more anterior supraspinatus insertion does remain. 4. Moderate atrophy in the supraspinatus, infraspinatus and teres minor muscles. 5. The bicipital tendon is not well seen and there could be a partial or complete intra-articular tear of it. 6. Calcific tendinopathy of the teres minor tendon. 7. Aortic and coronary artery atherosclerosis. Aortic Atherosclerosis (ICD10-I70.0). Electronically Signed   By: Francis Quam M.D.   On: 01/02/2024 07:10   DG Shoulder Left Portable Result Date: 01/02/2024 CLINICAL DATA:  Acute severe left shoulder pain without injury. Worsening pain this morning. EXAM: PORTABLE CHEST 1 VIEW LEFT SHOULDER 3 VIEWS COMPARISON:  CTA chest 08/31/2023. No prior left shoulder series for comparison. FINDINGS: Left shoulder: Normal bone mineralization.  No evidence of fracture or dislocation. There is mild  irregularity along the acromial undersurface, mild inferior spurring at the [facility] and glenohumeral joints. There is a flattening along the upper lateral humeral head which could indicate evidence of prior impaction injury such as due to a remote anterior dislocation. No Bankart fracture is seen.  Soft tissues are unremarkable. Chest AP portable 4:30 a.m.: The heart size and mediastinal contours are within normal limits. There is mild aortic atherosclerosis. Both lungs are clear. Again noted is a 3 level lower cervical ACDF plating and a reverse right shoulder arthroplasty. IMPRESSION: 1. No evidence of acute chest disease. 2. Mild degenerative changes of the left shoulder. 3. Flattening along the upper lateral humeral head which could indicate evidence of prior impaction injury such as due to a remote anterior dislocation. 4. Aortic atherosclerosis. Electronically Signed   By: Francis Quam M.D.   On: 01/02/2024 05:18   DG Chest Portable 1 View Result Date: 01/02/2024 CLINICAL DATA:  Acute severe left shoulder pain without injury. Worsening pain this morning. EXAM: PORTABLE CHEST 1 VIEW LEFT SHOULDER 3 VIEWS COMPARISON:  CTA chest 08/31/2023. No prior left shoulder series for comparison. FINDINGS: Left shoulder: Normal bone mineralization.  No evidence of fracture or dislocation. There is mild irregularity along the acromial undersurface, mild inferior spurring at the [facility] and glenohumeral joints. There is a flattening along the upper lateral humeral head which could indicate evidence of prior impaction injury such as due to a remote anterior dislocation. No Bankart fracture is seen.  Soft tissues are unremarkable. Chest AP portable 4:30 a.m.: The heart size and mediastinal contours are within normal limits. There is mild aortic atherosclerosis. Both lungs are clear. Again noted is a 3 level lower cervical ACDF plating and a reverse right shoulder arthroplasty. IMPRESSION: 1. No evidence of acute chest disease. 2.  Mild degenerative changes of the left shoulder. 3. Flattening along the upper lateral humeral head which could indicate evidence  of prior impaction injury such as due to a remote anterior dislocation. 4. Aortic atherosclerosis. Electronically Signed   By: Francis Quam M.D.   On: 01/02/2024 05:18    Disposition: Discharge disposition: 01-Home or Self Care          Follow-up Information     Kip Lynwood Double, PA-C Follow up in 2 week(s).   Specialty: Physician Assistant Contact information: [address] [place] [address] [phone]                  Signed: Debby JAYSON Amber 01/22/2024, 8:16 AM

## 2024-01-22 NOTE — TOC Initial Note (Addendum)
 Transition of Care [facility]) - Initial/Assessment Note    Patient Details  Name: Rodney Clark MRN: [id number] Date of Birth: [birth year]  [facility] CM/SW Contact:    [place] L Roanna Reaves, LCSW Phone Number: 01/22/2024, 8:30 AM  Clinical Narrative:                  Discharge summary entered. Patient requires a bari BSC and bari RW. DME ordered through [employer] and will be delivered to patient's post operative room.    8:41: CSW notified that the [facility] RW is not needed. Patient already has one. CSW updated [employer].        Patient Goals and CMS Choice            Expected Discharge Plan and Services         Expected Discharge Date: 01/22/24                                    Prior Living Arrangements/Services                       Activities of Daily Living   ADL Screening (condition at time of admission) Independently performs ADLs?: Yes (appropriate for developmental age) Is the patient deaf or have difficulty hearing?: No Does the patient have difficulty seeing, even when wearing glasses/contacts?: No Does the patient have difficulty concentrating, remembering, or making decisions?: No  Permission Sought/Granted                  Emotional Assessment              Admission diagnosis:  Primary osteoarthritis of right hip [M16.11] Status post total hip replacement, right [Z96.641] Patient Active Problem List   Diagnosis Date Noted   Status post total hip replacement, right 01/21/2024   Cervical spondylosis with myelopathy and radiculopathy 12/24/2021   Right upper quadrant abdominal pain    Sacroiliitis    OSA (obstructive sleep apnea) 09/19/2021   Upper abdominal pain 09/19/2021   Obesity, Class III, BMI 40-49.9 (morbid obesity) (HCC) 09/19/2021   Polyradiculitis 09/19/2021   Cholelithiasis 09/19/2021   Thoracic aortic aneurysm without rupture 09/19/2021   Spondylolisthesis at L5-S1 level 01/05/2018   Essential hypertension  08/28/2015   PCP:  Fernande Ophelia JINNY DOUGLAS, MD Pharmacy:   [facility] - [place], [place] - [address] [place] [address] Phone: [phone] Fax: [phone]  [facility] - [place] , [place] - [address] [place]  [place] [address] Phone: [phone] Fax: [phone]  [facility] [address] [place] [address] Phone: [phone] Fax: [phone]     Social Drivers of Health (SDOH) Social History: SDOH Screenings   Food Insecurity: No Food Insecurity (01/21/2024)  Housing: Low Risk  (01/21/2024)  Transportation Needs: No Transportation Needs (01/21/2024)  Utilities: Not At Risk (01/21/2024)  Financial Resource Strain: Low Risk  (01/12/2024)   Received from [facility]  Social Connections: Moderately Integrated (01/21/2024)  Tobacco Use: Low Risk  (01/21/2024)   SDOH Interventions:     Readmission Risk Interventions     No data to display

## 2024-01-22 NOTE — Plan of Care (Signed)
 Discharge order received. Patient vital signs WDL. Tolerating diet. Denies pain. Waiting for physical therapist to see him before discharging him. Surgical site incision minimal drainage noted on honeycomb dressing.

## 2024-01-22 NOTE — Plan of Care (Signed)
   Problem: Clinical Measurements: Goal: Ability to maintain clinical measurements within normal limits will improve Outcome: Progressing

## 2024-01-22 NOTE — Discharge Instructions (Signed)
° °POSTERIOR TOTAL HIP REPLACEMENT POSTOPERATIVE DIRECTIONS ° °Hip Rehabilitation, Guidelines Following Surgery  °The results of a hip operation are greatly improved after range of motion and muscle strengthening exercises. Follow all safety measures which are given to protect your hip. If any of these exercises cause increased pain or swelling in your joint, decrease the amount until you are comfortable again. Then slowly increase the exercises. Call your caregiver if you have problems or questions.  ° °HOME CARE INSTRUCTIONS  °Remove items at home which could result in a fall. This includes throw rugs or furniture in walking pathways.  °· ICE to the affected hip every three hours for 30 minutes at a time and then as needed for pain and swelling.  Continue to use ice on the hip for pain and swelling from surgery. You may notice swelling that will progress down to the foot and ankle.  This is normal after surgery.  Elevate the leg when you are not up walking on it.   °· Continue to use the breathing machine which will help keep your temperature down.  It is common for your temperature to cycle up and down following surgery, especially at night when you are not up moving around and exerting yourself.  The breathing machine keeps your lungs expanded and your temperature down. ° °DIET °You may resume your previous home diet once your are discharged from the hospital. ° °DRESSING / WOUND CARE / SHOWERING °You may start showering once staples have been removed. Change dressing as needed. ° ° ° °ACTIVITY °Walk with your walker as instructed. °Use walker as long as suggested by your caregivers. °Avoid periods of inactivity such as sitting longer than an hour when not asleep. This helps prevent blood clots.  °You may resume a sexual relationship in one month or when given the OK by your doctor.  °You may return to work once you are cleared by your doctor.  °Do not drive a car for 6 weeks or until released by you surgeon.    °Do not drive while taking narcotics. ° °WEIGHT BEARING °Weight bearing as tolerated ° °POSTOPERATIVE CONSTIPATION PROTOCOL °Constipation - defined medically as fewer than three stools per week and severe constipation as less than one stool per week. ° °One of the most common issues patients have following surgery is constipation.  Even if you have a regular bowel pattern at home, your normal regimen is likely to be disrupted due to multiple reasons following surgery.  Combination of anesthesia, postoperative narcotics, change in appetite and fluid intake all can affect your bowels.  In order to avoid complications following surgery, here are some recommendations in order to help you during your recovery period. ° °Colace (docusate) - Pick up an over-the-counter form of Colace or another stool softener and take twice a day as long as you are requiring postoperative pain medications.  Take with a full glass of water daily.  If you experience loose stools or diarrhea, hold the colace until you stool forms back up.  If your symptoms do not get better within 1 week or if they get worse, check with your doctor. ° °Dulcolax (bisacodyl) - Pick up over-the-counter and take as directed by the product packaging as needed to assist with the movement of your bowels.  Take with a full glass of water.  Use this product as needed if not relieved by Colace only.  ° °MiraLax (polyethylene glycol) - Pick up over-the-counter to have on hand.  MiraLax is a   solution that will increase the amount of water in your bowels to assist with bowel movements.  Take as directed and can mix with a glass of water, juice, soda, coffee, or tea.  Take if you go more than two days without a movement. °Do not use MiraLax more than once per day. Call your doctor if you are still constipated or irregular after using this medication for 7 days in a row. ° °If you continue to have problems with postoperative constipation, please contact the office for  further assistance and recommendations.  If you experience "the worst abdominal pain ever" or develop nausea or vomiting, please contact the office immediatly for further recommendations for treatment. ° °ITCHING ° If you experience itching with your medications, try taking only a single pain pill, or even half a pain pill at a time.  You can also use Benadryl over the counter for itching or also to help with sleep.  ° °TED HOSE STOCKINGS °Wear the elastic stockings on both legs for six weeks following surgery during the day but you may remove then at night for sleeping. ° °MEDICATIONS °See your medication summary on the “After Visit Summary” that the nursing staff will review with you prior to discharge.  You may have some home medications which will be placed on hold until you complete the course of blood thinner medication.  It is important for you to complete the blood thinner medication as prescribed by your surgeon.  Continue your approved medications as instructed at time of discharge. ° °PRECAUTIONS °If you experience chest pain or shortness of breath - call 911 immediately for transfer to the hospital emergency department.  °If you develop a fever greater that 101 F, purulent drainage from wound, increased redness or drainage from wound, foul odor from the wound/dressing, or calf pain - CONTACT YOUR SURGEON.   °                                                °FOLLOW-UP APPOINTMENTS °Make sure you keep all of your appointments after your operation with your surgeon and caregivers. You should call the office at the above phone number and make an appointment for approximately two weeks after the date of your surgery or on the date instructed by your surgeon outlined in the "After Visit Summary". ° °RANGE OF MOTION AND STRENGTHENING EXERCISES  °These exercises are designed to help you keep full movement of your hip joint. Follow your caregiver's or physical therapist's instructions. Perform all exercises about  fifteen times, three times per day or as directed. Exercise both hips, even if you have had only one joint replacement. These exercises can be done on a training (exercise) mat, on the floor, on a table or on a bed. Use whatever works the best and is most comfortable for you. Use music or television while you are exercising so that the exercises are a pleasant break in your day. This will make your life better with the exercises acting as a break in routine you can look forward to.  °Lying on your back, slowly slide your foot toward your buttocks, raising your knee up off the floor. Then slowly slide your foot back down until your leg is straight again.  °Lying on your back spread your legs as far apart as you can without causing discomfort.  °Lying on your   side, raise your upper leg and foot straight up from the floor as far as is comfortable. Slowly lower the leg and repeat.  °Lying on your back, tighten up the muscle in the front of your thigh (quadriceps muscles). You can do this by keeping your leg straight and trying to raise your heel off the floor. This helps strengthen the largest muscle supporting your knee.  °Lying on your back, tighten up the muscles of your buttocks both with the legs straight and with the knee bent at a comfortable angle while keeping your heel on the floor.  ° ° ° ° °IF YOU ARE TRANSFERRED TO A SKILLED REHAB FACILITY °If the patient is transferred to a skilled rehab facility following release from the hospital, a list of the current medications will be sent to the facility for the patient to continue.  When discharged from the skilled rehab facility, please have the facility set up the patient's Home Health Physical Therapy prior to being released. Also, the skilled facility will be responsible for providing the patient with their medications at time of release from the facility to include their pain medication, the muscle relaxants, and their blood thinner medication. If the patient  is still at the rehab facility at time of the two week follow up appointment, the skilled rehab facility will also need to assist the patient in arranging follow up appointment in our office and any transportation needs. ° °MAKE SURE YOU:  °Understand these instructions.  °Get help right away if you are not doing well or get worse.  ° ° °Pick up stool softner and laxative for home use following surgery while on pain medications. °Continue to use ice for pain and swelling after surgery. °Do not use any lotions or creams on the incision until instructed by your surgeon. ° °

## 2024-01-22 NOTE — Anesthesia Postprocedure Evaluation (Signed)
 Anesthesia Post Note  Patient: Rodney Clark  Procedure(s) Performed: ARTHROPLASTY, HIP, TOTAL,POSTERIOR APPROACH (Right: Hip)  Patient location during evaluation: Short Stay Anesthesia Type: Spinal Level of consciousness: awake and alert and oriented Pain management: satisfactory to patient Vital Signs Assessment: post-procedure vital signs reviewed and stable Respiratory status: spontaneous breathing Cardiovascular status: stable Postop Assessment: patient able to bend at knees, no apparent nausea or vomiting, adequate PO intake and able to ambulate Anesthetic complications: no Comments: Has been able to void.   No notable events documented.   Last Vitals:  Vitals:   01/22/24 0000 01/22/24 0348  BP: 138/78 (!) 146/98  Pulse: 74 93  Resp: 18 18  Temp: (!) 36.1 C 36.9 C  SpO2: 96% 98%    Last Pain:  Vitals:   01/22/24 0348  TempSrc: Oral  PainSc: 3                  [name] Dyane

## 2024-01-25 DIAGNOSIS — M25551 Pain in right hip: Secondary | ICD-10-CM | POA: Diagnosis not present

## 2024-01-25 DIAGNOSIS — Z96641 Presence of right artificial hip joint: Secondary | ICD-10-CM | POA: Diagnosis not present

## 2024-01-29 DIAGNOSIS — M25551 Pain in right hip: Secondary | ICD-10-CM | POA: Diagnosis not present

## 2024-01-29 DIAGNOSIS — Z96641 Presence of right artificial hip joint: Secondary | ICD-10-CM | POA: Diagnosis not present

## 2024-01-29 DIAGNOSIS — E118 Type 2 diabetes mellitus with unspecified complications: Secondary | ICD-10-CM | POA: Diagnosis not present

## 2024-01-29 DIAGNOSIS — E7849 Other hyperlipidemia: Secondary | ICD-10-CM | POA: Diagnosis not present

## 2024-01-29 DIAGNOSIS — K219 Gastro-esophageal reflux disease without esophagitis: Secondary | ICD-10-CM | POA: Diagnosis not present

## 2024-02-02 DIAGNOSIS — M25551 Pain in right hip: Secondary | ICD-10-CM | POA: Diagnosis not present

## 2024-02-02 DIAGNOSIS — Z96641 Presence of right artificial hip joint: Secondary | ICD-10-CM | POA: Diagnosis not present

## 2024-02-03 DIAGNOSIS — G4733 Obstructive sleep apnea (adult) (pediatric): Secondary | ICD-10-CM | POA: Diagnosis not present

## 2024-02-04 ENCOUNTER — Other Ambulatory Visit: Payer: Self-pay

## 2024-02-04 DIAGNOSIS — M519 Unspecified thoracic, thoracolumbar and lumbosacral intervertebral disc disorder: Secondary | ICD-10-CM | POA: Diagnosis not present

## 2024-02-04 DIAGNOSIS — E7849 Other hyperlipidemia: Secondary | ICD-10-CM | POA: Diagnosis not present

## 2024-02-04 DIAGNOSIS — M5090 Cervical disc disorder, unspecified, unspecified cervical region: Secondary | ICD-10-CM | POA: Diagnosis not present

## 2024-02-04 DIAGNOSIS — E118 Type 2 diabetes mellitus with unspecified complications: Secondary | ICD-10-CM | POA: Diagnosis not present

## 2024-02-04 DIAGNOSIS — M25551 Pain in right hip: Secondary | ICD-10-CM | POA: Diagnosis not present

## 2024-02-04 DIAGNOSIS — I10 Essential (primary) hypertension: Secondary | ICD-10-CM | POA: Diagnosis not present

## 2024-02-04 DIAGNOSIS — G4733 Obstructive sleep apnea (adult) (pediatric): Secondary | ICD-10-CM | POA: Diagnosis not present

## 2024-02-04 DIAGNOSIS — C61 Malignant neoplasm of prostate: Secondary | ICD-10-CM | POA: Diagnosis not present

## 2024-02-04 DIAGNOSIS — I7121 Aneurysm of the ascending aorta, without rupture: Secondary | ICD-10-CM | POA: Diagnosis not present

## 2024-02-04 DIAGNOSIS — Z96641 Presence of right artificial hip joint: Secondary | ICD-10-CM | POA: Diagnosis not present

## 2024-02-04 MED ORDER — MOUNJARO 5 MG/0.5ML ~~LOC~~ SOAJ
5.0000 mg | SUBCUTANEOUS | 11 refills | Status: AC
  Filled 2024-02-04: qty 2, 28d supply, fill #0
  Filled 2024-03-02: qty 2, 28d supply, fill #1
  Filled 2024-03-28: qty 2, 28d supply, fill #2
  Filled 2024-04-26: qty 2, 28d supply, fill #3

## 2024-02-08 DIAGNOSIS — M5416 Radiculopathy, lumbar region: Secondary | ICD-10-CM | POA: Diagnosis not present

## 2024-02-08 DIAGNOSIS — M5412 Radiculopathy, cervical region: Secondary | ICD-10-CM | POA: Diagnosis not present

## 2024-02-08 DIAGNOSIS — M48062 Spinal stenosis, lumbar region with neurogenic claudication: Secondary | ICD-10-CM | POA: Diagnosis not present

## 2024-02-08 DIAGNOSIS — M4802 Spinal stenosis, cervical region: Secondary | ICD-10-CM | POA: Diagnosis not present

## 2024-02-08 DIAGNOSIS — M47816 Spondylosis without myelopathy or radiculopathy, lumbar region: Secondary | ICD-10-CM | POA: Diagnosis not present

## 2024-02-09 DIAGNOSIS — Z96641 Presence of right artificial hip joint: Secondary | ICD-10-CM | POA: Diagnosis not present

## 2024-02-09 DIAGNOSIS — M25551 Pain in right hip: Secondary | ICD-10-CM | POA: Diagnosis not present

## 2024-02-11 DIAGNOSIS — M25551 Pain in right hip: Secondary | ICD-10-CM | POA: Diagnosis not present

## 2024-02-11 DIAGNOSIS — Z96641 Presence of right artificial hip joint: Secondary | ICD-10-CM | POA: Diagnosis not present

## 2024-02-16 DIAGNOSIS — M25551 Pain in right hip: Secondary | ICD-10-CM | POA: Diagnosis not present

## 2024-02-16 DIAGNOSIS — Z96641 Presence of right artificial hip joint: Secondary | ICD-10-CM | POA: Diagnosis not present

## 2024-02-16 DIAGNOSIS — H26493 Other secondary cataract, bilateral: Secondary | ICD-10-CM | POA: Diagnosis not present

## 2024-02-18 DIAGNOSIS — Z96641 Presence of right artificial hip joint: Secondary | ICD-10-CM | POA: Diagnosis not present

## 2024-02-18 DIAGNOSIS — M25551 Pain in right hip: Secondary | ICD-10-CM | POA: Diagnosis not present

## 2024-02-22 DIAGNOSIS — Z96641 Presence of right artificial hip joint: Secondary | ICD-10-CM | POA: Diagnosis not present

## 2024-02-22 DIAGNOSIS — M25551 Pain in right hip: Secondary | ICD-10-CM | POA: Diagnosis not present

## 2024-02-24 DIAGNOSIS — Z96641 Presence of right artificial hip joint: Secondary | ICD-10-CM | POA: Diagnosis not present

## 2024-02-24 DIAGNOSIS — M25551 Pain in right hip: Secondary | ICD-10-CM | POA: Diagnosis not present

## 2024-03-01 DIAGNOSIS — M25551 Pain in right hip: Secondary | ICD-10-CM | POA: Diagnosis not present

## 2024-03-01 DIAGNOSIS — Z96641 Presence of right artificial hip joint: Secondary | ICD-10-CM | POA: Diagnosis not present

## 2024-03-02 ENCOUNTER — Other Ambulatory Visit: Payer: Self-pay

## 2024-03-02 ENCOUNTER — Other Ambulatory Visit (HOSPITAL_COMMUNITY): Payer: Self-pay

## 2024-03-04 DIAGNOSIS — M25551 Pain in right hip: Secondary | ICD-10-CM | POA: Diagnosis not present

## 2024-03-04 DIAGNOSIS — Z96641 Presence of right artificial hip joint: Secondary | ICD-10-CM | POA: Diagnosis not present

## 2024-03-07 DIAGNOSIS — Z96641 Presence of right artificial hip joint: Secondary | ICD-10-CM | POA: Diagnosis not present

## 2024-03-07 DIAGNOSIS — M25551 Pain in right hip: Secondary | ICD-10-CM | POA: Diagnosis not present

## 2024-03-16 ENCOUNTER — Other Ambulatory Visit: Payer: Self-pay | Admitting: *Deleted

## 2024-03-16 DIAGNOSIS — C61 Malignant neoplasm of prostate: Secondary | ICD-10-CM

## 2024-03-21 ENCOUNTER — Inpatient Hospital Stay: Attending: Radiation Oncology

## 2024-03-21 DIAGNOSIS — C61 Malignant neoplasm of prostate: Secondary | ICD-10-CM | POA: Diagnosis present

## 2024-03-21 LAB — PSA: Prostatic Specific Antigen: <0.02 ng/mL (ref 0.00–4.00)

## 2024-03-21 LAB — CBC (CANCER CENTER ONLY)
HCT: 44.4 % (ref 39.0–52.0)
Hemoglobin: 14.8 g/dL (ref 13.0–17.0)
MCH: 29 pg (ref 26.0–34.0)
MCHC: 33.3 g/dL (ref 30.0–36.0)
MCV: 86.9 fL (ref 80.0–100.0)
Platelet Count: 341 K/uL (ref 150–400)
RBC: 5.11 MIL/uL (ref 4.22–5.81)
RDW: 12.9 % (ref 11.5–15.5)
WBC Count: 9.1 K/uL (ref 4.0–10.5)
nRBC: 0 % (ref 0.0–0.2)

## 2024-03-28 ENCOUNTER — Other Ambulatory Visit: Payer: Self-pay

## 2024-03-28 ENCOUNTER — Ambulatory Visit
Admission: RE | Admit: 2024-03-28 | Discharge: 2024-03-28 | Disposition: A | Discharge status: Home / Self Care | Source: Ambulatory Visit | Attending: Radiation Oncology | Admitting: Radiation Oncology

## 2024-03-28 VITALS — BP 128/78 | HR 67 | Temp 97.5°F | Wt 302.0 lb

## 2024-03-28 DIAGNOSIS — C61 Malignant neoplasm of prostate: Secondary | ICD-10-CM | POA: Insufficient documentation

## 2024-03-28 DIAGNOSIS — Z923 Personal history of irradiation: Secondary | ICD-10-CM | POA: Diagnosis not present

## 2024-03-28 DIAGNOSIS — R351 Nocturia: Secondary | ICD-10-CM | POA: Diagnosis not present

## 2024-03-28 DIAGNOSIS — K5900 Constipation, unspecified: Secondary | ICD-10-CM | POA: Insufficient documentation

## 2024-03-28 NOTE — Progress Notes (Signed)
 Radiation Oncology Follow up Note  Name: Rodney Clark   Date:   03/28/2024 MRN:  [id number] DOB: 03-10-1956    This 68 y.o. male presents to the clinic today for 70-month follow-up status post image guided IMRT radiation therapy for stage IIc (cT1 cN0 M0).  Gleason 8 adenocarcinoma presented with a PSA value of 11.4  REFERRING PROVIDER: Fernande Ophelia JINNY DOUGLAS, MD  HPI: Patient is a 68 year old male now out 4 months having completed image guided IMRT radiation therapy for Gleason 8 adenocarcinoma of the prostate.  Seen today in routine follow-up he is doing well he tends towards constipation.  Specifically denies any increased lower urinary tract symptoms has nocturia about twice a night.  His most recent PSA is less than 0.02 showing excellent biochemical control of his disease..  COMPLICATIONS OF TREATMENT: none  FOLLOW UP COMPLIANCE: keeps appointments   PHYSICAL EXAM:  BP 128/78   Pulse 67   Temp (!) 97.5 F (36.4 C) (Tympanic)   Wt (!) 302 lb (137 kg)   BMI 43.33 kg/m  Well-developed well-nourished patient in NAD. HEENT reveals PERLA, EOMI, discs not visualized.  Oral cavity is clear. No oral mucosal lesions are identified. Neck is clear without evidence of cervical or supraclavicular adenopathy. Lungs are clear to A&P. Cardiac examination is essentially unremarkable with regular rate and rhythm without murmur rub or thrill. Abdomen is benign with no organomegaly or masses noted. Motor sensory and DTR levels are equal and symmetric in the upper and lower extremities. Cranial nerves II through XII are grossly intact. Proprioception is intact. No peripheral adenopathy or edema is identified. No motor or sensory levels are noted. Crude visual fields are within normal range.  RADIOLOGY RESULTS: No current films for review  PLAN: Present time patient is doing well under excellent biochemical control of his prostate cancer.  I am pleased with his overall progress.  I suggested some MiraLAX   on a daily basis for his constipation.  I have assured him as nothing to do with his radiation treatments.  Of asked to see him back in 6 months with a follow-up PSA.  Patient comprehends my recommendations well.  I would like to take this opportunity to thank you for allowing me to participate in the care of your patient.SABRA Marcey Penton, MD

## 2024-04-12 ENCOUNTER — Encounter: Payer: Self-pay | Admitting: Dermatology

## 2024-04-12 ENCOUNTER — Ambulatory Visit: Admitting: Dermatology

## 2024-04-12 DIAGNOSIS — Z85828 Personal history of other malignant neoplasm of skin: Secondary | ICD-10-CM

## 2024-04-12 DIAGNOSIS — W908XXA Exposure to other nonionizing radiation, initial encounter: Secondary | ICD-10-CM | POA: Diagnosis not present

## 2024-04-12 DIAGNOSIS — L219 Seborrheic dermatitis, unspecified: Secondary | ICD-10-CM

## 2024-04-12 DIAGNOSIS — Z1283 Encounter for screening for malignant neoplasm of skin: Secondary | ICD-10-CM | POA: Diagnosis not present

## 2024-04-12 DIAGNOSIS — L821 Other seborrheic keratosis: Secondary | ICD-10-CM

## 2024-04-12 DIAGNOSIS — L578 Other skin changes due to chronic exposure to nonionizing radiation: Secondary | ICD-10-CM | POA: Diagnosis not present

## 2024-04-12 DIAGNOSIS — L814 Other melanin hyperpigmentation: Secondary | ICD-10-CM | POA: Diagnosis not present

## 2024-04-12 DIAGNOSIS — Z79899 Other long term (current) drug therapy: Secondary | ICD-10-CM

## 2024-04-12 DIAGNOSIS — L570 Actinic keratosis: Secondary | ICD-10-CM

## 2024-04-12 DIAGNOSIS — D229 Melanocytic nevi, unspecified: Secondary | ICD-10-CM

## 2024-04-12 DIAGNOSIS — D1801 Hemangioma of skin and subcutaneous tissue: Secondary | ICD-10-CM | POA: Diagnosis not present

## 2024-04-12 DIAGNOSIS — Z8589 Personal history of malignant neoplasm of other organs and systems: Secondary | ICD-10-CM

## 2024-04-12 DIAGNOSIS — Z7189 Other specified counseling: Secondary | ICD-10-CM

## 2024-04-12 MED ORDER — KETOCONAZOLE 2 % EX CREA: TOPICAL_CREAM | CUTANEOUS | 6 refills | Status: AC

## 2024-04-12 NOTE — Progress Notes (Signed)
 "  Follow-Up Visit   Subjective  Rodney Clark is a 69 y.o. male who presents for the following: Skin Cancer Screening and Full Body Skin Exam, hx of SCC, hx of Aks   The patient presents for Total-Body Skin Exam (TBSE) for skin cancer screening and mole check. The patient has spots, moles and lesions to be evaluated, some may be new or changing and the patient may have concern these could be cancer.  The following portions of the chart were reviewed this encounter and updated as appropriate: medications, allergies, medical history  Review of Systems:  No other skin or systemic complaints except as noted in HPI or Assessment and Plan.  Objective  Well appearing patient in no apparent distress; mood and affect are within normal limits.  A full examination was performed including scalp, head, eyes, ears, nose, lips, neck, chest, axillae, abdomen, back, buttocks, bilateral upper extremities, bilateral lower extremities, hands, feet, fingers, toes, fingernails, and toenails. All findings within normal limits unless otherwise noted below.   Relevant physical exam findings are noted in the Assessment and Plan.  face x 6, hands x 17 (23) Erythematous thin papules/macules with gritty scale.   Assessment & Plan   SKIN CANCER SCREENING PERFORMED TODAY.  LENTIGINES, SEBORRHEIC KERATOSES, HEMANGIOMAS - Benign normal skin lesions - Benign-appearing - Call for any changes  MELANOCYTIC NEVI - Tan-brown and/or pink-flesh-colored symmetric macules and papules - Benign appearing on exam today - Observation - Call clinic for new or changing moles - Recommend daily use of broad spectrum spf 30+ sunscreen to sun-exposed areas.   SEBORRHEIC DERMATITIS Exam: Pink patches with greasy scale at face Chronic and persistent condition with duration or expected duration over one year. Condition is symptomatic/ bothersome to patient. Not currently at goal.  Seborrheic Dermatitis is a chronic persistent rash  characterized by pinkness and scaling most commonly of the mid face but also can occur on the scalp (dandruff), ears; mid chest, mid back and groin.  It tends to be exacerbated by stress and cooler weather.  People who have neurologic disease may experience new onset or exacerbation of existing seborrheic dermatitis.  The condition is not curable but treatable and can be controlled. Treatment Plan: start Ketoconazole  2% cream apply to face at bedtime    HISTORY OF SQUAMOUS CELL CARCINOMA OF THE SKIN Left nasal tip treated with topical chemo  Left temple  - No evidence of recurrence today - No lymphadenopathy - Recommend regular full body skin exams - Recommend daily broad spectrum sunscreen SPF 30+ to sun-exposed areas, reapply every 2 hours as needed.  - Call if any new or changing lesions are noted between office visits    AK (ACTINIC KERATOSIS) (23) face x 6, hands x 17 (23) ACTINIC DAMAGE - chronic, secondary to cumulative UV radiation exposure/sun exposure over time - diffuse scaly erythematous macules with underlying dyspigmentation - Recommend daily broad spectrum sunscreen SPF 30+ to sun-exposed areas, reapply every 2 hours as needed.  - Recommend staying in the shade or wearing long sleeves, sun glasses (UVA+UVB protection) and wide brim hats (4-inch brim around the entire circumference of the hat). - Call for new or changing lesions.  - Destruction of lesion - face x 6, hands x 17 (23) Complexity: simple   Destruction method: cryotherapy   Informed consent: discussed and consent obtained   Timeout:  patient name, date of birth, surgical site, and procedure verified Lesion destroyed using liquid nitrogen: Yes   Region frozen until ice  ball extended beyond lesion: Yes   Outcome: patient tolerated procedure well with no complications   Post-procedure details: wound care instructions given      Return in about 6 months (around 10/10/2024) for Aks, UBSE.  IFay Kirks,  CMA, am acting as scribe for Alm Rhyme, MD .   Documentation: I have reviewed the above documentation for accuracy and completeness, and I agree with the above.  Alm Rhyme, MD    "

## 2024-04-12 NOTE — Patient Instructions (Signed)

## 2024-04-27 ENCOUNTER — Other Ambulatory Visit: Payer: Self-pay

## 2024-09-21 ENCOUNTER — Inpatient Hospital Stay

## 2024-09-28 ENCOUNTER — Ambulatory Visit: Admitting: Radiation Oncology

## 2024-10-20 ENCOUNTER — Ambulatory Visit: Admitting: Dermatology
